# Patient Record
Sex: Female | Born: 1975
Health system: Southern US, Community
[De-identification: ages and names within clinical notes are randomized; demographics above are authoritative.]

## PROBLEM LIST (undated history)

## (undated) DIAGNOSIS — I10 Essential (primary) hypertension: Secondary | ICD-10-CM

## (undated) DIAGNOSIS — D649 Anemia, unspecified: Secondary | ICD-10-CM

## (undated) DIAGNOSIS — I499 Cardiac arrhythmia, unspecified: Secondary | ICD-10-CM

## (undated) DIAGNOSIS — N83209 Unspecified ovarian cyst, unspecified side: Secondary | ICD-10-CM

## (undated) HISTORY — PX: BREAST ENHANCEMENT SURGERY: SHX7

## (undated) HISTORY — PX: APPENDECTOMY: SHX54

---

## 1999-02-28 ENCOUNTER — Other Ambulatory Visit: Admission: RE | Admit: 1999-02-28 | Discharge: 1999-02-28 | Payer: Self-pay | Admitting: Obstetrics and Gynecology

## 1999-03-05 ENCOUNTER — Inpatient Hospital Stay (HOSPITAL_COMMUNITY): Admission: AD | Admit: 1999-03-05 | Discharge: 1999-03-05 | Payer: Self-pay | Admitting: Obstetrics

## 1999-07-04 ENCOUNTER — Other Ambulatory Visit: Admission: RE | Admit: 1999-07-04 | Discharge: 1999-07-04 | Payer: Self-pay | Admitting: Obstetrics and Gynecology

## 1999-07-11 ENCOUNTER — Inpatient Hospital Stay (HOSPITAL_COMMUNITY): Admission: AD | Admit: 1999-07-11 | Discharge: 1999-07-11 | Payer: Self-pay | Admitting: Obstetrics and Gynecology

## 1999-08-07 ENCOUNTER — Other Ambulatory Visit: Admission: RE | Admit: 1999-08-07 | Discharge: 1999-08-07 | Payer: Self-pay | Admitting: Obstetrics and Gynecology

## 1999-08-07 ENCOUNTER — Encounter (INDEPENDENT_AMBULATORY_CARE_PROVIDER_SITE_OTHER): Payer: Self-pay | Admitting: Specialist

## 1999-09-02 ENCOUNTER — Encounter (INDEPENDENT_AMBULATORY_CARE_PROVIDER_SITE_OTHER): Payer: Self-pay | Admitting: Specialist

## 1999-09-02 ENCOUNTER — Inpatient Hospital Stay (HOSPITAL_COMMUNITY): Admission: AD | Admit: 1999-09-02 | Discharge: 1999-09-04 | Payer: Self-pay | Admitting: *Deleted

## 1999-11-16 ENCOUNTER — Encounter (INDEPENDENT_AMBULATORY_CARE_PROVIDER_SITE_OTHER): Payer: Self-pay

## 1999-11-16 ENCOUNTER — Other Ambulatory Visit: Admission: RE | Admit: 1999-11-16 | Discharge: 1999-11-16 | Payer: Self-pay | Admitting: *Deleted

## 2000-03-06 ENCOUNTER — Emergency Department (HOSPITAL_COMMUNITY): Admission: EM | Admit: 2000-03-06 | Discharge: 2000-03-06 | Payer: Self-pay | Admitting: Emergency Medicine

## 2001-02-14 ENCOUNTER — Other Ambulatory Visit: Admission: RE | Admit: 2001-02-14 | Discharge: 2001-02-14 | Payer: Self-pay | Admitting: Obstetrics and Gynecology

## 2001-03-05 ENCOUNTER — Encounter (INDEPENDENT_AMBULATORY_CARE_PROVIDER_SITE_OTHER): Payer: Self-pay

## 2001-03-05 ENCOUNTER — Other Ambulatory Visit: Admission: RE | Admit: 2001-03-05 | Discharge: 2001-03-05 | Payer: Self-pay | Admitting: Obstetrics and Gynecology

## 2002-05-18 ENCOUNTER — Other Ambulatory Visit: Admission: RE | Admit: 2002-05-18 | Discharge: 2002-05-18 | Payer: Self-pay | Admitting: Obstetrics and Gynecology

## 2002-06-25 ENCOUNTER — Encounter: Payer: Self-pay | Admitting: Emergency Medicine

## 2002-06-25 ENCOUNTER — Emergency Department (HOSPITAL_COMMUNITY): Admission: EM | Admit: 2002-06-25 | Discharge: 2002-06-25 | Payer: Self-pay | Admitting: Emergency Medicine

## 2003-09-04 ENCOUNTER — Emergency Department (HOSPITAL_COMMUNITY): Admission: EM | Admit: 2003-09-04 | Discharge: 2003-09-04 | Payer: Self-pay | Admitting: Emergency Medicine

## 2003-09-09 ENCOUNTER — Emergency Department (HOSPITAL_COMMUNITY): Admission: EM | Admit: 2003-09-09 | Discharge: 2003-09-10 | Payer: Self-pay | Admitting: *Deleted

## 2004-12-17 ENCOUNTER — Emergency Department (HOSPITAL_COMMUNITY): Admission: EM | Admit: 2004-12-17 | Discharge: 2004-12-17 | Payer: Self-pay | Admitting: Emergency Medicine

## 2006-09-02 ENCOUNTER — Emergency Department: Payer: Self-pay | Admitting: Emergency Medicine

## 2009-11-06 ENCOUNTER — Emergency Department (HOSPITAL_COMMUNITY): Admission: EM | Admit: 2009-11-06 | Discharge: 2009-11-06 | Payer: Self-pay | Admitting: Emergency Medicine

## 2009-11-07 ENCOUNTER — Emergency Department (HOSPITAL_COMMUNITY): Admission: EM | Admit: 2009-11-07 | Discharge: 2009-11-07 | Payer: Self-pay | Admitting: Emergency Medicine

## 2011-03-09 NOTE — H&P (Signed)
[facility]  Patient:    Melanie Moreno                        MRN: [id number] Adm. Date:  [id number] Attending:  Pleas Koch Dictator:   Vance Gather Duplantis, C.N.M.                         History and Physical  HISTORY OF PRESENT ILLNESS:       Ms. Melanie Moreno is a 35 year old, divorced, white female, gravida 2, para 1-0-0-1 at 36-5/7 weeks who presents complaining of leaking clear fluid at approximately 2 p.m. today.  She reports contractions every 5 to 6 minutes that are mild to moderate.  Her pregnancy has been followed at [facility] by the certified nurse midwife service and has been essentially  uncomplicated though at risk for preterm uterine contractions with this pregnancy and a history of preterm uterine contractions with the previous pregnancy.  She is also rubella negative.  She reports that she is group B Strep negative.  She denies any nausea, vomiting, headaches, or visual disturbances.  She reports positive fetal movement.  OBSTETRICAL HISTORY:              She delivered a viable female infant who weighed 6 pounds 2 ounces at [redacted] weeks gestation following a six-hour labor in September of 1996 with no complications.  Her other GYN history is noncontributory.  ALLERGIES:                        No known drug allergies.  PAST MEDICAL HISTORY:             She reports having had the usual childhood diseases.  She reports occasional urinary tract infections and that she smokes approximately five to eight cigarettes a day.  FAMILY HISTORY:                   Significant for patients mother and maternal grandmother with hypertension.  Otherwise noncontributory.  Genetic history is negative.  SOCIAL HISTORY:                   She is divorced, but the father of the baby is Maley Venezia, and he is involved and supportive.  They are both employed full time. They deny illicit drug use or alcohol with this pregnancy, but she  does report smoking approximately five to eight cigarettes a day.  PRENATAL LABORATORY DATA:         Her blood type is O positive.  Her antibody screen is negative.  Syphilis is nonreactive.  Rubella is negative.  Hepatitis  surface antigen is negative.  Pap is CIN-1, and she requires a repeat Pap postpartum.  Gonorrhea and chlamydia are negative.  HIV is nonreactive. One-hour glucola was elevated, three-hour GGT was within normal limits.  PHYSICAL EXAMINATION:  VITAL SIGNS:                      Stable.  She is afebrile.  HEENT:                            Grossly within normal limits.  HEART:                            Regular rhythm  and rate.  CHEST:                            Clear.  BREASTS:                          Soft and nontender.  ABDOMEN:                          Gravid with uterine contractions every five the six minutes.  Fetal heart rate is reassuring.  PELVIC:                           Copious amounts of clear amniotic fluid leaking. Her cervix is 2 to 3 cm, 90% vertex, -1 station.  EXTREMITIES:                      Within normal limits.  ASSESSMENT:                       1. Intrauterine pregnancy at term.                                   2. Spontaneous rupture of the membranes with                                      clear fluid.                                   3. Negative group B Streptococcus per patient.  PLAN:                             Admit to labor and delivery.  Follow routine                                   certified nurse-midwife orders and to notify                                   Dr. Trudie Reed of patients admission. DD:  09/02/99 TD:  09/02/99 Job: [id number]

## 2011-03-09 NOTE — Op Note (Signed)
[facility]  Patient:    Melanie Moreno                        MRN: [id number] Proc. Date: 09/02/99 Adm. Date:  [id number] Attending:  Pleas Koch                           Operative Report  PREOPERATIVE DIAGNOSIS:       Term pregnancy in multiparous patient with fetal arrhythmia.  POSTOPERATIVE DIAGNOSIS:      Term pregnancy in multiparous patient with fetal arrhythmia.  OPERATION:  SURGEON:                      Georgina Peer, M.D.  ASSISTANT:  ANESTHESIA:  ESTIMATED BLOOD LOSS:  INDICATIONS:                  The patient is a 35 year old, gravida 2, para 1-0-0-1, at 36-5/7 weeks who presented with spontaneous rupture of membranes. he patient developed a fetal arrhythmias with lapses of monitored fetal heart rate  from anywhere from 3 to 15 seconds.  Ultrasound was performed at the bedside which showed dysfunctional contractile cardiac activity during these times.  The patient progressed rapidly to complete and pushed well to +2 station.  There was some fetal bradycardia in the 70s and 80s.  A vacuum extraction for delivery was suggested  with the vertex at +2 to +3.  The risks of vacuum was discussed with the patient. It thought wise to proceed to expedite delivery.  DESCRIPTION OF PROCEDURE:     Kiwi vacuum was applied and delivery was accomplished at 1933 over an intact perineum.  There was mild periurethral abrasions which were not bleeding.  A viable female infant was delivered.  The team was present. Apgars were 8 and 8.  There was an arrhythmia noted by the neonatologist and EKG will e performed.  The placenta was delivered at 1940 intact and sent to pathology. There was minimal bleeding.  Mother was stable.  The baby will be followed closely in the nursery. DD:  09/02/99 TD:  09/04/99 Job: [id number]

## 2013-10-28 ENCOUNTER — Encounter (HOSPITAL_COMMUNITY): Payer: Self-pay | Admitting: Emergency Medicine

## 2013-10-28 ENCOUNTER — Emergency Department (HOSPITAL_COMMUNITY): Payer: BC Managed Care – PPO

## 2013-10-28 ENCOUNTER — Emergency Department (HOSPITAL_COMMUNITY)
Admission: EM | Admit: 2013-10-28 | Discharge: 2013-10-28 | Disposition: A | Discharge status: Home / Self Care | Payer: BC Managed Care – PPO | Attending: Emergency Medicine | Admitting: Emergency Medicine

## 2013-10-28 DIAGNOSIS — Z3202 Encounter for pregnancy test, result negative: Secondary | ICD-10-CM | POA: Insufficient documentation

## 2013-10-28 DIAGNOSIS — F17200 Nicotine dependence, unspecified, uncomplicated: Secondary | ICD-10-CM | POA: Insufficient documentation

## 2013-10-28 DIAGNOSIS — R109 Unspecified abdominal pain: Secondary | ICD-10-CM | POA: Insufficient documentation

## 2013-10-28 DIAGNOSIS — Z8679 Personal history of other diseases of the circulatory system: Secondary | ICD-10-CM | POA: Insufficient documentation

## 2013-10-28 DIAGNOSIS — R110 Nausea: Secondary | ICD-10-CM | POA: Insufficient documentation

## 2013-10-28 DIAGNOSIS — Z862 Personal history of diseases of the blood and blood-forming organs and certain disorders involving the immune mechanism: Secondary | ICD-10-CM | POA: Insufficient documentation

## 2013-10-28 HISTORY — DX: Anemia, unspecified: D64.9

## 2013-10-28 HISTORY — DX: Cardiac arrhythmia, unspecified: I49.9

## 2013-10-28 LAB — CBC WITH DIFFERENTIAL/PLATELET
Basophils Absolute: 0 10*3/uL (ref 0.0–0.1)
Basophils Relative: 0 % (ref 0–1)
Eosinophils Absolute: 0.1 10*3/uL (ref 0.0–0.7)
Eosinophils Relative: 1 % (ref 0–5)
HCT: 37.7 % (ref 36.0–46.0)
Hemoglobin: 13.1 g/dL (ref 12.0–15.0)
Lymphocytes Relative: 35 % (ref 12–46)
Lymphs Abs: 3 10*3/uL (ref 0.7–4.0)
MCH: 32.8 pg (ref 26.0–34.0)
MCHC: 34.7 g/dL (ref 30.0–36.0)
MCV: 94.3 fL (ref 78.0–100.0)
Monocytes Absolute: 0.7 10*3/uL (ref 0.1–1.0)
Monocytes Relative: 8 % (ref 3–12)
Neutro Abs: 4.9 10*3/uL (ref 1.7–7.7)
Neutrophils Relative %: 56 % (ref 43–77)
Platelets: 226 10*3/uL (ref 150–400)
RBC: 4 MIL/uL (ref 3.87–5.11)
RDW: 13.5 % (ref 11.5–15.5)
WBC: 8.7 10*3/uL (ref 4.0–10.5)

## 2013-10-28 LAB — COMPREHENSIVE METABOLIC PANEL
ALT: 19 U/L (ref 0–35)
AST: 22 U/L (ref 0–37)
Albumin: 3.9 g/dL (ref 3.5–5.2)
Alkaline Phosphatase: 59 U/L (ref 39–117)
BUN: 17 mg/dL (ref 6–23)
CO2: 23 mEq/L (ref 19–32)
Calcium: 9.4 mg/dL (ref 8.4–10.5)
Chloride: 101 mEq/L (ref 96–112)
Creatinine, Ser: 0.8 mg/dL (ref 0.50–1.10)
GFR calc Af Amer: >90 mL/min (ref >90)
GFR calc non Af Amer: >90 mL/min (ref >90)
Glucose, Bld: 108 mg/dL — ABNORMAL HIGH (ref 70–99)
Potassium: 3.9 mEq/L (ref 3.7–5.3)
Sodium: 138 mEq/L (ref 137–147)
Total Bilirubin: <0.2 mg/dL — ABNORMAL LOW (ref 0.3–1.2)
Total Protein: 7.1 g/dL (ref 6.0–8.3)

## 2013-10-28 LAB — URINALYSIS, ROUTINE W REFLEX MICROSCOPIC
Bilirubin Urine: NEGATIVE
Glucose, UA: NEGATIVE mg/dL
Hgb urine dipstick: NEGATIVE
Ketones, ur: NEGATIVE mg/dL
Leukocytes, UA: NEGATIVE
Nitrite: NEGATIVE
Protein, ur: NEGATIVE mg/dL
Specific Gravity, Urine: 1.019 (ref 1.005–1.030)
Urobilinogen, UA: 1 mg/dL (ref 0.0–1.0)
pH: 6.5 (ref 5.0–8.0)

## 2013-10-28 LAB — LIPASE, BLOOD: Lipase: 19 U/L (ref 11–59)

## 2013-10-28 LAB — POCT PREGNANCY, URINE: Preg Test, Ur: NEGATIVE

## 2013-10-28 MED ORDER — CYCLOBENZAPRINE HCL 5 MG PO TABS: 5.0000 mg | ORAL_TABLET | Freq: Three times a day (TID) | ORAL | Status: DC | PRN

## 2013-10-28 MED ORDER — TRAMADOL HCL 50 MG PO TABS: 25.0000 mg | ORAL_TABLET | Freq: Four times a day (QID) | ORAL | Status: DC | PRN

## 2013-10-28 MED ORDER — MORPHINE SULFATE 4 MG/ML IJ SOLN
4.0000 mg | Freq: Once | INTRAMUSCULAR | Status: AC
  Administered 2013-10-28: 4 mg via INTRAVENOUS

## 2013-10-28 MED ORDER — GI COCKTAIL ~~LOC~~
30.0000 mL | Freq: Once | ORAL | Status: AC
  Administered 2013-10-28: 30 mL via ORAL
  Filled 2013-10-28: qty 30

## 2013-10-28 MED ORDER — MORPHINE SULFATE 4 MG/ML IJ SOLN
4.0000 mg | Freq: Once | INTRAMUSCULAR | Status: AC
  Administered 2013-10-28: 4 mg via INTRAVENOUS
  Filled 2013-10-28: qty 1

## 2013-10-28 MED ORDER — ONDANSETRON HCL 4 MG/2ML IJ SOLN
4.0000 mg | Freq: Once | INTRAMUSCULAR | Status: AC
  Administered 2013-10-28: 4 mg via INTRAVENOUS

## 2013-10-28 MED ORDER — IOHEXOL 300 MG/ML  SOLN
50.0000 mL | Freq: Once | INTRAMUSCULAR | Status: AC | PRN
  Administered 2013-10-28: 50 mL via ORAL

## 2013-10-28 MED ORDER — IOHEXOL 300 MG/ML  SOLN
100.0000 mL | Freq: Once | INTRAMUSCULAR | Status: AC | PRN
  Administered 2013-10-28: 100 mL via INTRAVENOUS

## 2013-10-28 MED ORDER — MORPHINE SULFATE 4 MG/ML IJ SOLN: 4.0000 mg | Freq: Once | INTRAMUSCULAR | Status: DC

## 2013-10-28 NOTE — ED Notes (Signed)
Pt from home c/o  Flank pain for 2 months on the right side. Pt was seen for same previously with no relief from meds.  Pt reports no urinary symptoms and that it gets worse she eats.

## 2013-10-28 NOTE — ED Provider Notes (Signed)
Medical screening examination/treatment/procedure(s) were performed by non-physician practitioner and as supervising physician I was immediately available for consultation/collaboration.  EKG Interpretation   None        Alonni Heimsoth F Marshawn Normoyle, MD 10/28/13 0734 

## 2013-10-28 NOTE — ED Provider Notes (Signed)
CSN: [id number]     Arrival date & time 10/28/13  0112 History   First MD Initiated Contact with Patient 10/28/13 0142     Chief Complaint  Patient presents with  . Flank Pain   (Consider location/radiation/quality/duration/timing/severity/associated sxs/prior Treatment) Patient is a 38 y.o. female presenting with flank pain. The history is provided by the patient.  Flank Pain This is a chronic problem. The current episode started more than 1 month ago. The problem occurs constantly. The problem has been gradually worsening. Associated symptoms include abdominal pain and nausea. Pertinent negatives include no anorexia, coughing, fever, rash, urinary symptoms or vomiting. The symptoms are aggravated by eating. She has tried NSAIDs for the symptoms. The treatment provided no relief.    Past Medical History  Diagnosis Date  . Anemia   . Irregular heart rate    Past Surgical History  Procedure Laterality Date  . Appendectomy    . Breast enhancement surgery     History reviewed. No pertinent family history. History  Substance Use Topics  . Smoking status: Current Every Day Smoker -- 1.00 packs/day    Types: Cigarettes  . Smokeless tobacco: Not on file  . Alcohol Use: Yes     Comment: occassional   OB History   Grav Para Term Preterm Abortions TAB SAB Ect Mult Living                 Review of Systems  Constitutional: Negative for fever.  Respiratory: Negative for cough.   Gastrointestinal: Positive for nausea and abdominal pain. Negative for vomiting, diarrhea, constipation and anorexia.  Genitourinary: Positive for flank pain. Negative for dysuria, decreased urine volume, vaginal bleeding, vaginal discharge, difficulty urinating, vaginal pain, menstrual problem and pelvic pain.  Skin: Negative for pallor and rash.  All other systems reviewed and are negative.    Allergies  Review of patient's allergies indicates no known allergies.  Home Medications   Current Outpatient  Rx  Name  Route  Sig  Dispense  Refill  . ibuprofen (ADVIL,MOTRIN) 200 MG tablet   Oral   Take 800 mg by mouth every 6 (six) hours as needed. For pain         . cyclobenzaprine (FLEXERIL) 5 MG tablet   Oral   Take 1 tablet (5 mg total) by mouth 3 (three) times daily as needed for muscle spasms.   30 tablet   0   . traMADol (ULTRAM) 50 MG tablet   Oral   Take 0.5 tablets (25 mg total) by mouth every 6 (six) hours as needed. For pain   30 tablet   0    BP 133/86  Pulse 100  Temp(Src) 98.7 F (37.1 C) (Oral)  Resp 18  Ht 5\' 3"  (1.6 m)  Wt 125 lb 4 oz (56.813 kg)  BMI 22.19 kg/m2  SpO2 99%  LMP 10/16/2013 Physical Exam  Nursing note and vitals reviewed. Constitutional: She appears well-developed and well-nourished.  HENT:  Head: Normocephalic.  Eyes: Pupils are equal, round, and reactive to light.  Cardiovascular: Normal rate and regular rhythm.   Pulmonary/Chest: Effort normal and breath sounds normal.  Abdominal: Soft. Bowel sounds are normal. She exhibits no distension. There is no hepatosplenomegaly. There is tenderness. There is no rebound and no guarding.    Musculoskeletal: Normal range of motion.  Skin: Skin is warm.    ED Course  Procedures (including critical care time) Labs Review Labs Reviewed  COMPREHENSIVE METABOLIC PANEL - Abnormal; Notable for the following:  Glucose, Bld 108 (*)    Total Bilirubin <0.2 (*)    All other components within normal limits  URINALYSIS, ROUTINE W REFLEX MICROSCOPIC - Abnormal; Notable for the following:    APPearance CLOUDY (*)    All other components within normal limits  CBC WITH DIFFERENTIAL  LIPASE, BLOOD  POCT PREGNANCY, URINE   Imaging Review US Abdomen Complete  10/28/2013   CLINICAL DATA:  Flank pain  EXAM: ULTRASOUND ABDOMEN COMPLETE  COMPARISON:  None.  FINDINGS: Gallbladder:  No gallstones or wall thickening visualized. No sonographic Murphy sign noted.  Common bile duct:  Diameter: 3 mm  Liver:  No  focal lesion suspected. There is occasional shadowing of the posterior segment right lobe, which is likely artifactual from the portal triad. No definite mass in this region when all images considered. Antegrade flow in the imaged portal venous system.  IVC:  No abnormality visualized.  Pancreas:  Visualized portion unremarkable.  Spleen:  Size and appearance within normal limits.  Right Kidney:  Length: 10 cm. Echogenicity within normal limits. No mass or hydronephrosis visualized.  Left Kidney:  Length: 11 cm. Echogenicity within normal limits. No mass or hydronephrosis visualized.  Abdominal aorta:  No aneurysm visualized.  Other findings:  None.  IMPRESSION: Negative abdominal sonogram.   Electronically Signed   By: Tiburcio Pea M.D.   On: 10/28/2013 02:52   Ct Abdomen Pelvis W Contrast  10/28/2013   CLINICAL DATA:  Right upper quadrant pain for months  EXAM: CT ABDOMEN AND PELVIS WITH CONTRAST  TECHNIQUE: Multidetector CT imaging of the abdomen and pelvis was performed using the standard protocol following bolus administration of intravenous contrast.  CONTRAST:  OMNIPAQUE IOHEXOL 300 MG/ML  SOLN  COMPARISON:  09/10/2003  FINDINGS: BODY WALL: Partly imaged bilateral breast implants. Visible portions unremarkable.  LOWER CHEST: Unremarkable.  ABDOMEN/PELVIS:  Liver: There may be mild fatty infiltration, with certainty decreased by early contrast timing.  Biliary: No evidence of biliary obstruction or stone.  Pancreas: Unremarkable.  Spleen: Unremarkable.  Adrenals: Unremarkable.  Kidneys and ureters: No hydronephrosis or stone.  Bladder: Unremarkable.  Reproductive: Right-sided corpus luteum.  Bowel: Circumferentially thick appearance of the gastric antral wall, 9 mm single wal this is in the indeterminate range, especially given collapsed state, but could be pathologic given the history of ongoing right upper quadrant pain. No bowel obstruction. Appendectomy.  Retroperitoneum: No mass or adenopathy.   Peritoneum: No free fluid or gas.  Vascular: No acute abnormality.  OSSEOUS: No acute abnormalities.  IMPRESSION: 1. No acute intra-abdominal finding. 2. Possible gastric antritis.   Electronically Signed   By: Tiburcio Pea M.D.   On: 10/28/2013 05:59    EKG Interpretation   None       MDM   1. Flank pain    No specific cause for this patient's pain has been identified.  Ultrasound, labs, and CT scan, all within normal limits.  Urine is also normal.  She has been given a prescription for tramadol as well as Flexeril to try regular basis for the next week.  If she continues to have pain.  Pain worsens, or changes in nature, or intensity.  She is to return to her primary care physician or the emergency department for further evaluation     Arman Filter, NP 10/28/13 1610

## 2013-10-28 NOTE — Discharge Instructions (Signed)
No specific cause for your flank pain.  Has been identified, tonight, your CT scan, and ultrasound, both were normal as were your labs and urine.  You have been given medication for pain control, as well as a muscle relaxant.  Please try this on a regular basis for the next 5-7 days.  Hopefully, this will help if you continue to have pain or your pain worsens, please followup with your primary care physician or return to the emergency room for further evaluation

## 2013-10-28 NOTE — ED Notes (Signed)
Patient is alert and oriented x3.  She was given DC instructions and follow up visit instructions.  Patient gave verbal understanding. She was DC ambulatory under her own power to home.  V/S stable.  He was not showing any signs of distress on DC 

## 2013-12-15 ENCOUNTER — Encounter (HOSPITAL_COMMUNITY): Payer: Self-pay | Admitting: Emergency Medicine

## 2013-12-15 ENCOUNTER — Emergency Department (HOSPITAL_COMMUNITY)
Admission: EM | Admit: 2013-12-15 | Discharge: 2013-12-16 | Disposition: A | Discharge status: Home / Self Care | Payer: BC Managed Care – PPO | Attending: Emergency Medicine | Admitting: Emergency Medicine

## 2013-12-15 DIAGNOSIS — F17200 Nicotine dependence, unspecified, uncomplicated: Secondary | ICD-10-CM | POA: Insufficient documentation

## 2013-12-15 DIAGNOSIS — Z9089 Acquired absence of other organs: Secondary | ICD-10-CM | POA: Insufficient documentation

## 2013-12-15 DIAGNOSIS — R1030 Lower abdominal pain, unspecified: Secondary | ICD-10-CM

## 2013-12-15 DIAGNOSIS — K219 Gastro-esophageal reflux disease without esophagitis: Secondary | ICD-10-CM

## 2013-12-15 DIAGNOSIS — Z3202 Encounter for pregnancy test, result negative: Secondary | ICD-10-CM | POA: Insufficient documentation

## 2013-12-15 DIAGNOSIS — Z8679 Personal history of other diseases of the circulatory system: Secondary | ICD-10-CM | POA: Insufficient documentation

## 2013-12-15 DIAGNOSIS — N889 Noninflammatory disorder of cervix uteri, unspecified: Secondary | ICD-10-CM | POA: Insufficient documentation

## 2013-12-15 DIAGNOSIS — Z862 Personal history of diseases of the blood and blood-forming organs and certain disorders involving the immune mechanism: Secondary | ICD-10-CM | POA: Insufficient documentation

## 2013-12-15 HISTORY — DX: Unspecified ovarian cyst, unspecified side: N83.209

## 2013-12-15 MED ORDER — HYDROMORPHONE HCL PF 1 MG/ML IJ SOLN
1.0000 mg | Freq: Once | INTRAMUSCULAR | Status: AC
  Administered 2013-12-16: 1 mg via INTRAVENOUS
  Filled 2013-12-15: qty 1

## 2013-12-15 MED ORDER — SODIUM CHLORIDE 0.9 % IV BOLUS (SEPSIS)
1000.0000 mL | Freq: Once | INTRAVENOUS | Status: AC
  Administered 2013-12-16: 1000 mL via INTRAVENOUS

## 2013-12-15 NOTE — ED Notes (Signed)
Pt reports that she began having a week ago, states that she has been having nausea and black tarry stools since that time. Pt reports hx of ovarian cyst rupture, reports pain worsens after eating. LBM today. Pt a&o x4, ambulatory to triage.

## 2013-12-15 NOTE — ED Provider Notes (Signed)
CSN: [id number]     Arrival date & time 12/15/13  2259 History   First MD Initiated Contact with Patient 12/15/13 2323     Chief Complaint  Patient presents with  . Abdominal Pain     (Consider location/radiation/quality/duration/timing/severity/associated sxs/prior Treatment) The history is provided by the patient. No language interpreter was used.  Melanie Moreno is a 38 y/o female with past mental history of anemia, irregular heartbeat, ovarian cyst presenting to emergency department with abdominal pain that started last Tuesday. Patient reported that the abdominal pain is localized to bilateral lower quadrant, pelvic discomfort described as "labor pains." Reported that the pain is constant, but reported that the pain worsens after eating. Reported that she was experiencing melena on Thursday-reported that with every single bowel movement she has been having black tarry stools. Reported that she's been having nausea. Patient reported that she has not taken anything for the discomfort. Patient reported last menstrual period approximately one week ago-reported regular cycles. Denied vomiting, hematochezia, chest pain, shortness of breath, difficulty breathing, fever, chills, urinary issues, vaginal discharge, vaginal bleeding. PCP none Patient has not seen an OBGYN physician in approximately 14 years.   Past Medical History  Diagnosis Date  . Anemia   . Irregular heart rate   . Ovarian cyst    Past Surgical History  Procedure Laterality Date  . Appendectomy    . Breast enhancement surgery     History reviewed. No pertinent family history. History  Substance Use Topics  . Smoking status: Current Every Day Smoker -- 1.00 packs/day    Types: Cigarettes  . Smokeless tobacco: Never Used  . Alcohol Use: Yes     Comment: occassional   OB History   Grav Para Term Preterm Abortions TAB SAB Ect Mult Living                 Review of Systems  Constitutional: Negative for fever and chills.   Respiratory: Negative for chest tightness and shortness of breath.   Cardiovascular: Negative for chest pain.  Gastrointestinal: Positive for nausea, abdominal pain and blood in stool. Negative for vomiting, diarrhea, constipation and anal bleeding.  Genitourinary: Positive for pelvic pain. Negative for vaginal bleeding, vaginal discharge and vaginal pain.  Musculoskeletal: Negative for back pain and neck pain.  Neurological: Negative for dizziness and weakness.  All other systems reviewed and are negative.    Allergies  Dilaudid  Home Medications   No current outpatient prescriptions on file. BP 118/76  Pulse 91  Temp(Src) 97.7 F (36.5 C) (Oral)  Resp 18  Ht 5\' 4"  (1.626 m)  Wt 126 lb (57.153 kg)  BMI 21.62 kg/m2  SpO2 97%  LMP 12/04/2013 Physical Exam  Nursing note and vitals reviewed. Constitutional: She is oriented to person, place, and time. She appears well-developed and well-nourished. No distress.  HENT:  Head: Normocephalic and atraumatic.  Mouth/Throat: Oropharynx is clear and moist. No oropharyngeal exudate.  Eyes: Conjunctivae and EOM are normal. Pupils are equal, round, and reactive to light. Right eye exhibits no discharge. Left eye exhibits no discharge.  Neck: Normal range of motion. Neck supple. No tracheal deviation present.  Cardiovascular: Normal rate, regular rhythm and normal heart sounds.  Exam reveals no friction rub.   No murmur heard. Pulses:      Radial pulses are 2+ on the right side, and 2+ on the left side.       Dorsalis pedis pulses are 2+ on the right side, and 2+ on the left  side.  Pulmonary/Chest: Effort normal and breath sounds normal. No respiratory distress. She has no wheezes. She has no rales.  Abdominal: Soft. Bowel sounds are normal. There is tenderness. There is no guarding.  Negative abdominal distension noted Bowel sounds normal active in all 4 quadrants Soft upon palpation Discomfort upon palpation to right lower quadrant  and left lower quadrant upon palpation Negative Murphy's sign Positive McBurney's point  Genitourinary:  Pelvic Exam: Negative swelling, erythema, inflammation, lesions, sores, abnormalities noted external genitalia. Negative swelling, erythema, inflammation, lesions, sores, deformities, masses noted to the vaginal canal. Negative blood in the vaginal vault. Negative discharge noted. Cervix identified with negative friability. Negative strawberry appearance. Negative active drainage noted from the cervical os. Negative CMT. Negative bilateral adnexal tenderness. Exam chaperoned  Rectal exam: Skin tag noted at approximately 5 o'clock. Negative swelling, erythema, inflammation, lesions, sores, hemorrhoids noted to the anus. Sphincter tone strong. Negative masses, internal hemorrhoids, swelling, fissures, lesions noted to the rectum. Brown stool on glove. Negative blood on glove.  Musculoskeletal: Normal range of motion.  Full ROM to upper and lower extremities without difficulty noted, negative ataxia noted.  Lymphadenopathy:    She has no cervical adenopathy.  Neurological: She is alert and oriented to person, place, and time. She exhibits normal muscle tone. Coordination normal.  Cranial nerves III-XII grossly intact Strength 5+/5+ to upper and lower extremities bilaterally with resistance applied, equal distribution noted  Skin: Skin is warm and dry. No rash noted. She is not diaphoretic. No erythema.  Psychiatric: She has a normal mood and affect. Her behavior is normal. Thought content normal.    ED Course  Procedures (including critical care time)  Results for orders placed during the hospital encounter of 12/15/13  WET PREP, GENITAL      Result Value Ref Range   Yeast Wet Prep HPF POC NONE SEEN  NONE SEEN   Trich, Wet Prep NONE SEEN  NONE SEEN   Clue Cells Wet Prep HPF POC NONE SEEN  NONE SEEN   WBC, Wet Prep HPF POC NONE SEEN  NONE SEEN  CBC WITH DIFFERENTIAL      Result Value Ref  Range   WBC 8.9  4.0 - 10.5 K/uL   RBC 4.03  3.87 - 5.11 MIL/uL   Hemoglobin 12.9  12.0 - 15.0 g/dL   HCT 96.038.3  45.436.0 - 09.846.0 %   MCV 95.0  78.0 - 100.0 fL   MCH 32.0  26.0 - 34.0 pg   MCHC 33.7  30.0 - 36.0 g/dL   RDW 11.913.1  14.711.5 - 82.915.5 %   Platelets 262  150 - 400 K/uL   Neutrophils Relative % 58  43 - 77 %   Neutro Abs 5.2  1.7 - 7.7 K/uL   Lymphocytes Relative 34  12 - 46 %   Lymphs Abs 3.1  0.7 - 4.0 K/uL   Monocytes Relative 6  3 - 12 %   Monocytes Absolute 0.6  0.1 - 1.0 K/uL   Eosinophils Relative 1  0 - 5 %   Eosinophils Absolute 0.1  0.0 - 0.7 K/uL   Basophils Relative 0  0 - 1 %   Basophils Absolute 0.0  0.0 - 0.1 K/uL  COMPREHENSIVE METABOLIC PANEL      Result Value Ref Range   Sodium 138  137 - 147 mEq/L   Potassium 4.1  3.7 - 5.3 mEq/L   Chloride 101  96 - 112 mEq/L   CO2 21  19 -  32 mEq/L   Glucose, Bld 101 (*) 70 - 99 mg/dL   BUN 21  6 - 23 mg/dL   Creatinine, Ser 1.61  0.50 - 1.10 mg/dL   Calcium 9.3  8.4 - 09.6 mg/dL   Total Protein 7.2  6.0 - 8.3 g/dL   Albumin 3.8  3.5 - 5.2 g/dL   AST 19  0 - 37 U/L   ALT 16  0 - 35 U/L   Alkaline Phosphatase 56  39 - 117 U/L   Total Bilirubin <0.2 (*) 0.3 - 1.2 mg/dL   GFR calc non Af Amer >90  >90 mL/min   GFR calc Af Amer >90  >90 mL/min  URINALYSIS, ROUTINE W REFLEX MICROSCOPIC      Result Value Ref Range   Color, Urine YELLOW  YELLOW   APPearance CLEAR  CLEAR   Specific Gravity, Urine 1.024  1.005 - 1.030   pH 6.0  5.0 - 8.0   Glucose, UA NEGATIVE  NEGATIVE mg/dL   Hgb urine dipstick NEGATIVE  NEGATIVE   Bilirubin Urine NEGATIVE  NEGATIVE   Ketones, ur NEGATIVE  NEGATIVE mg/dL   Protein, ur NEGATIVE  NEGATIVE mg/dL   Urobilinogen, UA 1.0  0.0 - 1.0 mg/dL   Nitrite NEGATIVE  NEGATIVE   Leukocytes, UA NEGATIVE  NEGATIVE  PREGNANCY, URINE      Result Value Ref Range   Preg Test, Ur NEGATIVE  NEGATIVE  LIPASE, BLOOD      Result Value Ref Range   Lipase 18  11 - 59 U/L    US Transvaginal  Non-ob  12/16/2013   CLINICAL DATA:  38 year old female with pelvic pain.  EXAM: TRANSABDOMINAL AND TRANSVAGINAL ULTRASOUND OF PELVIS  DOPPLER ULTRASOUND OF OVARIES  TECHNIQUE: Both transabdominal and transvaginal ultrasound examinations of the pelvis were performed. Transabdominal technique was performed for global imaging of the pelvis including uterus, ovaries, adnexal regions, and pelvic cul-de-sac.  It was necessary to proceed with endovaginal exam following the transabdominal exam to visualize the ovaries and endometrium. Color and duplex Doppler ultrasound was utilized to evaluate blood flow to the ovaries.  COMPARISON:  10/28/2013 CT  FINDINGS: Uterus  Measurements: Anteverted measuring 8.6 x 3.6 x 4.4 cm. Uterine echotexture is slightly heterogeneous. No fibroids or other mass visualized.  Endometrium  Thickness: 9 mm.  No focal abnormality visualized.  Right ovary  Measurements: 2.2 x 2.5 x 2.6 cm. Normal appearance/no adnexal mass.  Left ovary  Measurements: 3.1 x 2.2 x 3.2 cm. Normal appearance/no adnexal mass.  Pulsed Doppler evaluation of both ovaries demonstrates normal low-resistance arterial and venous waveforms.  Other findings  No free fluid or adnexal mass identified.  IMPRESSION: Unremarkable pelvic ultrasound. No evidence of ovarian torsion or mass.   Electronically Signed   By: Laveda Abbe M.D.   On: 12/16/2013 01:31   US Pelvis Complete  12/16/2013   CLINICAL DATA:  38 year old female with pelvic pain.  EXAM: TRANSABDOMINAL AND TRANSVAGINAL ULTRASOUND OF PELVIS  DOPPLER ULTRASOUND OF OVARIES  TECHNIQUE: Both transabdominal and transvaginal ultrasound examinations of the pelvis were performed. Transabdominal technique was performed for global imaging of the pelvis including uterus, ovaries, adnexal regions, and pelvic cul-de-sac.  It was necessary to proceed with endovaginal exam following the transabdominal exam to visualize the ovaries and endometrium. Color and duplex Doppler ultrasound  was utilized to evaluate blood flow to the ovaries.  COMPARISON:  10/28/2013 CT  FINDINGS: Uterus  Measurements: Anteverted measuring 8.6 x 3.6 x 4.4 cm. Uterine  echotexture is slightly heterogeneous. No fibroids or other mass visualized.  Endometrium  Thickness: 9 mm.  No focal abnormality visualized.  Right ovary  Measurements: 2.2 x 2.5 x 2.6 cm. Normal appearance/no adnexal mass.  Left ovary  Measurements: 3.1 x 2.2 x 3.2 cm. Normal appearance/no adnexal mass.  Pulsed Doppler evaluation of both ovaries demonstrates normal low-resistance arterial and venous waveforms.  Other findings  No free fluid or adnexal mass identified.  IMPRESSION: Unremarkable pelvic ultrasound. No evidence of ovarian torsion or mass.   Electronically Signed   By: Laveda Abbe M.D.   On: 12/16/2013 01:31   [place] Art/ven Flow Abd Pelv Doppler  12/16/2013   CLINICAL DATA:  38 year old female with pelvic pain.  EXAM: TRANSABDOMINAL AND TRANSVAGINAL ULTRASOUND OF PELVIS  DOPPLER ULTRASOUND OF OVARIES  TECHNIQUE: Both transabdominal and transvaginal ultrasound examinations of the pelvis were performed. Transabdominal technique was performed for global imaging of the pelvis including uterus, ovaries, adnexal regions, and pelvic cul-de-sac.  It was necessary to proceed with endovaginal exam following the transabdominal exam to visualize the ovaries and endometrium. Color and duplex Doppler ultrasound was utilized to evaluate blood flow to the ovaries.  COMPARISON:  10/28/2013 CT  FINDINGS: Uterus  Measurements: Anteverted measuring 8.6 x 3.6 x 4.4 cm. Uterine echotexture is slightly heterogeneous. No fibroids or other mass visualized.  Endometrium  Thickness: 9 mm.  No focal abnormality visualized.  Right ovary  Measurements: 2.2 x 2.5 x 2.6 cm. Normal appearance/no adnexal mass.  Left ovary  Measurements: 3.1 x 2.2 x 3.2 cm. Normal appearance/no adnexal mass.  Pulsed Doppler evaluation of both ovaries demonstrates normal low-resistance arterial and  venous waveforms.  Other findings  No free fluid or adnexal mass identified.  IMPRESSION: Unremarkable pelvic ultrasound. No evidence of ovarian torsion or mass.   Electronically Signed   By: Laveda Abbe M.D.   On: 12/16/2013 01:31   Labs Review Labs Reviewed  COMPREHENSIVE METABOLIC PANEL - Abnormal; Notable for the following:    Glucose, Bld 101 (*)    Total Bilirubin <0.2 (*)    All other components within normal limits  WET PREP, GENITAL  GC/CHLAMYDIA PROBE AMP  CBC WITH DIFFERENTIAL  URINALYSIS, ROUTINE W REFLEX MICROSCOPIC  PREGNANCY, URINE  LIPASE, BLOOD   Imaging Review US Transvaginal Non-ob  12/16/2013   CLINICAL DATA:  38 year old female with pelvic pain.  EXAM: TRANSABDOMINAL AND TRANSVAGINAL ULTRASOUND OF PELVIS  DOPPLER ULTRASOUND OF OVARIES  TECHNIQUE: Both transabdominal and transvaginal ultrasound examinations of the pelvis were performed. Transabdominal technique was performed for global imaging of the pelvis including uterus, ovaries, adnexal regions, and pelvic cul-de-sac.  It was necessary to proceed with endovaginal exam following the transabdominal exam to visualize the ovaries and endometrium. Color and duplex Doppler ultrasound was utilized to evaluate blood flow to the ovaries.  COMPARISON:  10/28/2013 CT  FINDINGS: Uterus  Measurements: Anteverted measuring 8.6 x 3.6 x 4.4 cm. Uterine echotexture is slightly heterogeneous. No fibroids or other mass visualized.  Endometrium  Thickness: 9 mm.  No focal abnormality visualized.  Right ovary  Measurements: 2.2 x 2.5 x 2.6 cm. Normal appearance/no adnexal mass.  Left ovary  Measurements: 3.1 x 2.2 x 3.2 cm. Normal appearance/no adnexal mass.  Pulsed Doppler evaluation of both ovaries demonstrates normal low-resistance arterial and venous waveforms.  Other findings  No free fluid or adnexal mass identified.  IMPRESSION: Unremarkable pelvic ultrasound. No evidence of ovarian torsion or mass.   Electronically Signed   By: Trey Paula  Hu  M.D.   On: 12/16/2013 01:31   US Pelvis Complete  12/16/2013   CLINICAL DATA:  38 year old female with pelvic pain.  EXAM: TRANSABDOMINAL AND TRANSVAGINAL ULTRASOUND OF PELVIS  DOPPLER ULTRASOUND OF OVARIES  TECHNIQUE: Both transabdominal and transvaginal ultrasound examinations of the pelvis were performed. Transabdominal technique was performed for global imaging of the pelvis including uterus, ovaries, adnexal regions, and pelvic cul-de-sac.  It was necessary to proceed with endovaginal exam following the transabdominal exam to visualize the ovaries and endometrium. Color and duplex Doppler ultrasound was utilized to evaluate blood flow to the ovaries.  COMPARISON:  10/28/2013 CT  FINDINGS: Uterus  Measurements: Anteverted measuring 8.6 x 3.6 x 4.4 cm. Uterine echotexture is slightly heterogeneous. No fibroids or other mass visualized.  Endometrium  Thickness: 9 mm.  No focal abnormality visualized.  Right ovary  Measurements: 2.2 x 2.5 x 2.6 cm. Normal appearance/no adnexal mass.  Left ovary  Measurements: 3.1 x 2.2 x 3.2 cm. Normal appearance/no adnexal mass.  Pulsed Doppler evaluation of both ovaries demonstrates normal low-resistance arterial and venous waveforms.  Other findings  No free fluid or adnexal mass identified.  IMPRESSION: Unremarkable pelvic ultrasound. No evidence of ovarian torsion or mass.   Electronically Signed   By: Laveda Abbe M.D.   On: 12/16/2013 01:31   [place] Art/ven Flow Abd Pelv Doppler  12/16/2013   CLINICAL DATA:  38 year old female with pelvic pain.  EXAM: TRANSABDOMINAL AND TRANSVAGINAL ULTRASOUND OF PELVIS  DOPPLER ULTRASOUND OF OVARIES  TECHNIQUE: Both transabdominal and transvaginal ultrasound examinations of the pelvis were performed. Transabdominal technique was performed for global imaging of the pelvis including uterus, ovaries, adnexal regions, and pelvic cul-de-sac.  It was necessary to proceed with endovaginal exam following the transabdominal exam to visualize the  ovaries and endometrium. Color and duplex Doppler ultrasound was utilized to evaluate blood flow to the ovaries.  COMPARISON:  10/28/2013 CT  FINDINGS: Uterus  Measurements: Anteverted measuring 8.6 x 3.6 x 4.4 cm. Uterine echotexture is slightly heterogeneous. No fibroids or other mass visualized.  Endometrium  Thickness: 9 mm.  No focal abnormality visualized.  Right ovary  Measurements: 2.2 x 2.5 x 2.6 cm. Normal appearance/no adnexal mass.  Left ovary  Measurements: 3.1 x 2.2 x 3.2 cm. Normal appearance/no adnexal mass.  Pulsed Doppler evaluation of both ovaries demonstrates normal low-resistance arterial and venous waveforms.  Other findings  No free fluid or adnexal mass identified.  IMPRESSION: Unremarkable pelvic ultrasound. No evidence of ovarian torsion or mass.   Electronically Signed   By: Laveda Abbe M.D.   On: 12/16/2013 01:31    EKG Interpretation   None       MDM   Final diagnoses:  None   Medications  sodium chloride 0.9 % bolus 1,000 mL (0 mLs Intravenous Stopped 12/16/13 0052)  HYDROmorphone (DILAUDID) injection 1 mg (1 mg Intravenous Given 12/16/13 0010)  sodium chloride 0.9 % bolus 1,000 mL (0 mLs Intravenous Stopped 12/16/13 0222)  iohexol (OMNIPAQUE) 300 MG/ML solution 50 mL (50 mLs Oral Contrast Given 12/16/13 0237)  morphine 2 MG/ML injection 2 mg (2 mg Intravenous Given 12/16/13 0258)  ondansetron (ZOFRAN) injection 4 mg (4 mg Intravenous Given 12/16/13 0258)  sodium chloride 0.9 % bolus 1,000 mL (1,000 mLs Intravenous New Bag/Given 12/16/13 0258)  iohexol (OMNIPAQUE) 300 MG/ML solution 100 mL (100 mLs Intravenous Contrast Given 12/16/13 0309)   Filed Vitals:   12/15/13 2307 12/16/13 0121 12/16/13 0141 12/16/13 0219  BP: 135/87 123/74 118/73 118/76  Pulse: 105 85 85 91  Temp: 98.2 F (36.8 C)   97.7 F (36.5 C)  TempSrc: Oral   Oral  Resp: 20 18 20 18   Height: 5\' 4"  (1.626 m)     Weight: 126 lb (57.153 kg)     SpO2: 97% 99% 98% 97%    Patient presenting to the ED  with abdominal pain localize to bilateral lower quadrants that started last Tuesday described as "labor pains" that are constant, reported worsening pain more so after eating. Patient reports that the pain predominantly stays within the lower portion of her abdomen without radiation. Reported feeling nauseous with negative episodes of emesis. Reported that starting Thursday patient has been having continuous episodes of melena with each bowel movement. Reported history of ovarian cysts. This provider reviewed and assessed the patient's chart. Patient was seen and assessed on 10/28/2013 regarding flank pain. CT abdomen and pelvis with contrast performed with findings of possible gastric antritis. Patient was discharged home. Alert oriented. GCS 15. Heart rate and rhythm normal. Lungs good auscultation to upper lower lobes bilaterally. Radial pulses 2+ bilaterally. DP pulses 2+ bilaterally. Cap refill less than 3 seconds. Bowel sounds normal active in all 4 quadrants, soft upon palpation. Discomfort upon palpation to the bilateral lower quadrants-left lower quadrant and right lower quadrant. Discomfort upon palpation to suprapubic region. Positive McBurney's point. Negative Murphy's sign. Negative bilateral CVA tenderness. Pelvic exam noted a remarkable findings-negative CMT, negative bilateral adnexal tenderness. Rectal exam unremarkable-negative blood on glove, brown stool on glove-negative palpation of masses. CBC negative elevated white blood cell count noted-negative leukocytes, negative left shift. Negtaive drop in Hgb and Hct - doubt acute bleed. Fecal occult negative. CMP negative findings. Lipase negative elevation. Urine pregnancy negative. Urinalysis negative for infection-negative nitrites or leukocytes noted-negative hemoglobin. Wet prep negative findings. GC/Chlamydia probe pending. Ultrasound of pelvis negative for ovarian portion. Unremarkable pelvic findings on ultrasound.  Doubt pancreatitis.  Doubt ovarian torsion. Doubt PID. Doubt TOA. Negative findings for vaginal infection. CT abdomen and pelvis pending. Discussed case with Mellody Drown, PA-C. Transfer of care to Mellody Drown, PA-C at change in shift.     Raymon Mutton, PA-C 12/16/13 [address] PA-C 12/16/13 1554

## 2013-12-16 ENCOUNTER — Emergency Department (HOSPITAL_COMMUNITY): Payer: BC Managed Care – PPO

## 2013-12-16 ENCOUNTER — Encounter (HOSPITAL_COMMUNITY): Payer: Self-pay

## 2013-12-16 LAB — CBC WITH DIFFERENTIAL/PLATELET
BASOS PCT: 0 % (ref 0–1)
Basophils Absolute: 0 10*3/uL (ref 0.0–0.1)
EOS ABS: 0.1 10*3/uL (ref 0.0–0.7)
Eosinophils Relative: 1 % (ref 0–5)
HCT: 38.3 % (ref 36.0–46.0)
Hemoglobin: 12.9 g/dL (ref 12.0–15.0)
LYMPHS ABS: 3.1 10*3/uL (ref 0.7–4.0)
Lymphocytes Relative: 34 % (ref 12–46)
MCH: 32 pg (ref 26.0–34.0)
MCHC: 33.7 g/dL (ref 30.0–36.0)
MCV: 95 fL (ref 78.0–100.0)
Monocytes Absolute: 0.6 10*3/uL (ref 0.1–1.0)
Monocytes Relative: 6 % (ref 3–12)
Neutro Abs: 5.2 10*3/uL (ref 1.7–7.7)
Neutrophils Relative %: 58 % (ref 43–77)
PLATELETS: 262 10*3/uL (ref 150–400)
RBC: 4.03 MIL/uL (ref 3.87–5.11)
RDW: 13.1 % (ref 11.5–15.5)
WBC: 8.9 10*3/uL (ref 4.0–10.5)

## 2013-12-16 LAB — URINALYSIS, ROUTINE W REFLEX MICROSCOPIC
BILIRUBIN URINE: NEGATIVE
GLUCOSE, UA: NEGATIVE mg/dL
Hgb urine dipstick: NEGATIVE
Ketones, ur: NEGATIVE mg/dL
Leukocytes, UA: NEGATIVE
NITRITE: NEGATIVE
PH: 6 (ref 5.0–8.0)
Protein, ur: NEGATIVE mg/dL
SPECIFIC GRAVITY, URINE: 1.024 (ref 1.005–1.030)
Urobilinogen, UA: 1 mg/dL (ref 0.0–1.0)

## 2013-12-16 LAB — WET PREP, GENITAL
Clue Cells Wet Prep HPF POC: NONE SEEN
TRICH WET PREP: NONE SEEN
WBC WET PREP: NONE SEEN
YEAST WET PREP: NONE SEEN

## 2013-12-16 LAB — GC/CHLAMYDIA PROBE AMP
CT Probe RNA: NEGATIVE
GC Probe RNA: NEGATIVE

## 2013-12-16 LAB — COMPREHENSIVE METABOLIC PANEL
ALK PHOS: 56 U/L (ref 39–117)
ALT: 16 U/L (ref 0–35)
AST: 19 U/L (ref 0–37)
Albumin: 3.8 g/dL (ref 3.5–5.2)
BUN: 21 mg/dL (ref 6–23)
CALCIUM: 9.3 mg/dL (ref 8.4–10.5)
CO2: 21 mEq/L (ref 19–32)
Chloride: 101 mEq/L (ref 96–112)
Creatinine, Ser: 0.67 mg/dL (ref 0.50–1.10)
GFR calc non Af Amer: >90 mL/min (ref >90)
GLUCOSE: 101 mg/dL — AB (ref 70–99)
Potassium: 4.1 mEq/L (ref 3.7–5.3)
SODIUM: 138 meq/L (ref 137–147)
TOTAL PROTEIN: 7.2 g/dL (ref 6.0–8.3)
Total Bilirubin: <0.2 mg/dL — ABNORMAL LOW (ref 0.3–1.2)

## 2013-12-16 LAB — LIPASE, BLOOD: LIPASE: 18 U/L (ref 11–59)

## 2013-12-16 LAB — PREGNANCY, URINE: Preg Test, Ur: NEGATIVE

## 2013-12-16 LAB — POC OCCULT BLOOD, ED: Fecal Occult Bld: NEGATIVE

## 2013-12-16 MED ORDER — OMEPRAZOLE 20 MG PO CPDR: 20.0000 mg | DELAYED_RELEASE_CAPSULE | Freq: Every day | ORAL | Status: DC

## 2013-12-16 MED ORDER — IOHEXOL 300 MG/ML  SOLN
50.0000 mL | Freq: Once | INTRAMUSCULAR | Status: AC | PRN
  Administered 2013-12-16: 50 mL via ORAL

## 2013-12-16 MED ORDER — ONDANSETRON HCL 4 MG/2ML IJ SOLN
4.0000 mg | Freq: Once | INTRAMUSCULAR | Status: AC
  Administered 2013-12-16: 4 mg via INTRAVENOUS
  Filled 2013-12-16: qty 2

## 2013-12-16 MED ORDER — SODIUM CHLORIDE 0.9 % IV BOLUS (SEPSIS)
1000.0000 mL | Freq: Once | INTRAVENOUS | Status: AC
  Administered 2013-12-16: 1000 mL via INTRAVENOUS

## 2013-12-16 MED ORDER — IOHEXOL 300 MG/ML  SOLN
100.0000 mL | Freq: Once | INTRAMUSCULAR | Status: AC | PRN
  Administered 2013-12-16: 100 mL via INTRAVENOUS

## 2013-12-16 MED ORDER — MORPHINE SULFATE 2 MG/ML IJ SOLN
2.0000 mg | Freq: Once | INTRAMUSCULAR | Status: AC
  Administered 2013-12-16: 2 mg via INTRAVENOUS
  Filled 2013-12-16: qty 1

## 2013-12-16 NOTE — ED Provider Notes (Signed)
0315: Patient care [facility] from [facility] Facility[facility] Sciacca, PA-C Patient is a 38 year old female presenting with lower abdominal pain for several days, worsened by food, and reports dark stools.  Labs and US unremarkable. Awaiting CT results.  Likely PUD.  Results for orders placed during the hospital encounter of 12/15/13  WET PREP, GENITAL      Result Value Ref Range   Yeast Wet Prep HPF POC NONE SEEN  NONE SEEN   Trich, Wet Prep NONE SEEN  NONE SEEN   Clue Cells Wet Prep HPF POC NONE SEEN  NONE SEEN   WBC, Wet Prep HPF POC NONE SEEN  NONE SEEN  CBC WITH DIFFERENTIAL      Result Value Ref Range   WBC 8.9  4.0 - 10.5 K/uL   RBC 4.03  3.87 - 5.11 MIL/uL   Hemoglobin 12.9  12.0 - 15.0 g/dL   HCT 78.438.3  69.636.0 - 29.546.0 %   MCV 95.0  78.0 - 100.0 fL   MCH 32.0  26.0 - 34.0 pg   MCHC 33.7  30.0 - 36.0 g/dL   RDW 28.413.1  13.211.5 - 44.015.5 %   Platelets 262  150 - 400 K/uL   Neutrophils Relative % 58  43 - 77 %   Neutro Abs 5.2  1.7 - 7.7 K/uL   Lymphocytes Relative 34  12 - 46 %   Lymphs Abs 3.1  0.7 - 4.0 K/uL   Monocytes Relative 6  3 - 12 %   Monocytes Absolute 0.6  0.1 - 1.0 K/uL   Eosinophils Relative 1  0 - 5 %   Eosinophils Absolute 0.1  0.0 - 0.7 K/uL   Basophils Relative 0  0 - 1 %   Basophils Absolute 0.0  0.0 - 0.1 K/uL  COMPREHENSIVE METABOLIC PANEL      Result Value Ref Range   Sodium 138  137 - 147 mEq/L   Potassium 4.1  3.7 - 5.3 mEq/L   Chloride 101  96 - 112 mEq/L   CO2 21  19 - 32 mEq/L   Glucose, Bld 101 (*) 70 - 99 mg/dL   BUN 21  6 - 23 mg/dL   Creatinine, Ser 1.020.67  0.50 - 1.10 mg/dL   Calcium 9.3  8.4 - 72.510.5 mg/dL   Total Protein 7.2  6.0 - 8.3 g/dL   Albumin 3.8  3.5 - 5.2 g/dL   AST 19  0 - 37 U/L   ALT 16  0 - 35 U/L   Alkaline Phosphatase 56  39 - 117 U/L   Total Bilirubin <0.2 (*) 0.3 - 1.2 mg/dL   GFR calc non Af Amer >90  >90 mL/min   GFR calc Af Amer >90  >90 mL/min  URINALYSIS, ROUTINE W REFLEX MICROSCOPIC      Result Value Ref Range   Color, Urine YELLOW  YELLOW    APPearance CLEAR  CLEAR   Specific Gravity, Urine 1.024  1.005 - 1.030   pH 6.0  5.0 - 8.0   Glucose, UA NEGATIVE  NEGATIVE mg/dL   Hgb urine dipstick NEGATIVE  NEGATIVE   Bilirubin Urine NEGATIVE  NEGATIVE   Ketones, ur NEGATIVE  NEGATIVE mg/dL   Protein, ur NEGATIVE  NEGATIVE mg/dL   Urobilinogen, UA 1.0  0.0 - 1.0 mg/dL   Nitrite NEGATIVE  NEGATIVE   Leukocytes, UA NEGATIVE  NEGATIVE  PREGNANCY, URINE      Result Value Ref Range   Preg Test, Ur NEGATIVE  NEGATIVE  LIPASE, BLOOD      Result Value Ref Range   Lipase 18  11 - 59 U/L   US Transvaginal Non-ob  12/16/2013   CLINICAL DATA:  38 year old female with pelvic pain.  EXAM: TRANSABDOMINAL AND TRANSVAGINAL ULTRASOUND OF PELVIS  DOPPLER ULTRASOUND OF OVARIES  TECHNIQUE: Both transabdominal and transvaginal ultrasound examinations of the pelvis were performed. Transabdominal technique was performed for global imaging of the pelvis including uterus, ovaries, adnexal regions, and pelvic cul-de-sac.  It was necessary to proceed with endovaginal exam following the transabdominal exam to visualize the ovaries and endometrium. Color and duplex Doppler ultrasound was utilized to evaluate blood flow to the ovaries.  COMPARISON:  10/28/2013 CT  FINDINGS: Uterus  Measurements: Anteverted measuring 8.6 x 3.6 x 4.4 cm. Uterine echotexture is slightly heterogeneous. No fibroids or other mass visualized.  Endometrium  Thickness: 9 mm.  No focal abnormality visualized.  Right ovary  Measurements: 2.2 x 2.5 x 2.6 cm. Normal appearance/no adnexal mass.  Left ovary  Measurements: 3.1 x 2.2 x 3.2 cm. Normal appearance/no adnexal mass.  Pulsed Doppler evaluation of both ovaries demonstrates normal low-resistance arterial and venous waveforms.  Other findings  No free fluid or adnexal mass identified.  IMPRESSION: Unremarkable pelvic ultrasound. No evidence of ovarian torsion or mass.   Electronically Signed   By: Laveda Abbe M.D.   On: 12/16/2013 01:31   US  Pelvis Complete  12/16/2013   CLINICAL DATA:  38 year old female with pelvic pain.  EXAM: TRANSABDOMINAL AND TRANSVAGINAL ULTRASOUND OF PELVIS  DOPPLER ULTRASOUND OF OVARIES  TECHNIQUE: Both transabdominal and transvaginal ultrasound examinations of the pelvis were performed. Transabdominal technique was performed for global imaging of the pelvis including uterus, ovaries, adnexal regions, and pelvic cul-de-sac.  It was necessary to proceed with endovaginal exam following the transabdominal exam to visualize the ovaries and endometrium. Color and duplex Doppler ultrasound was utilized to evaluate blood flow to the ovaries.  COMPARISON:  10/28/2013 CT  FINDINGS: Uterus  Measurements: Anteverted measuring 8.6 x 3.6 x 4.4 cm. Uterine echotexture is slightly heterogeneous. No fibroids or other mass visualized.  Endometrium  Thickness: 9 mm.  No focal abnormality visualized.  Right ovary  Measurements: 2.2 x 2.5 x 2.6 cm. Normal appearance/no adnexal mass.  Left ovary  Measurements: 3.1 x 2.2 x 3.2 cm. Normal appearance/no adnexal mass.  Pulsed Doppler evaluation of both ovaries demonstrates normal low-resistance arterial and venous waveforms.  Other findings  No free fluid or adnexal mass identified.  IMPRESSION: Unremarkable pelvic ultrasound. No evidence of ovarian torsion or mass.   Electronically Signed   By: Laveda Abbe M.D.   On: 12/16/2013 01:31   Ct Abdomen Pelvis W Contrast  12/16/2013   CLINICAL DATA:  Abdominal pain, nausea, dark stools for 1 week. History of ruptured ovarian cyst and appendectomy.  EXAM: CT ABDOMEN AND PELVIS WITH CONTRAST  TECHNIQUE: Multidetector CT imaging of the abdomen and pelvis was performed using the standard protocol following bolus administration of intravenous contrast.  CONTRAST:  OMNIPAQUE IOHEXOL 300 MG/ML  SOLN  COMPARISON:  US PELVIS COMPLETE dated 12/16/2013; CT ABD/PELVIS W CM dated 10/28/2013  FINDINGS: Included view of the lung bases are clear. Visualized heart and  pericardium are unremarkable.  The liver, spleen, gallbladder, pancreas and adrenal glands are unremarkable.  The stomach, small and large bowel are normal in course and caliber without inflammatory changes. Fractured since though improved distal stomach wall thickening without pneumatosis. Status post appendectomy. No intraperitoneal free  air.  Kidneys are orthotopic, demonstrating symmetric enhancement without nephrolithiasis, hydronephrosis or renal masses. The unopacified ureters are normal in course and caliber. Delayed imaging through the kidneys demonstrates symmetric prompt excretion to the proximal urinary collecting system. Urinary bladder is partially distended and unremarkable.  Great vessels are normal in course and caliber. No lymphadenopathy by CT size criteria. Cervical edema versus small amount of free fluid about the cervix, axial 66/77. Moderate L5-S1 degenerative disc disease with minimal neural foraminal narrowing. . Partially imaged breast implants.  IMPRESSION: Improved residual gastric wall thickening may reflect resolving gastritis.  Edema versus small amount of free fluid about the cervix, consider correlation with gynecological examination.   Electronically Signed   By: Awilda Metro   On: 12/16/2013 03:26   [place] Art/ven Flow Abd Pelv Doppler  12/16/2013   CLINICAL DATA:  38 year old female with pelvic pain.  EXAM: TRANSABDOMINAL AND TRANSVAGINAL ULTRASOUND OF PELVIS  DOPPLER ULTRASOUND OF OVARIES  TECHNIQUE: Both transabdominal and transvaginal ultrasound examinations of the pelvis were performed. Transabdominal technique was performed for global imaging of the pelvis including uterus, ovaries, adnexal regions, and pelvic cul-de-sac.  It was necessary to proceed with endovaginal exam following the transabdominal exam to visualize the ovaries and endometrium. Color and duplex Doppler ultrasound was utilized to evaluate blood flow to the ovaries.  COMPARISON:  10/28/2013 CT  FINDINGS:  Uterus  Measurements: Anteverted measuring 8.6 x 3.6 x 4.4 cm. Uterine echotexture is slightly heterogeneous. No fibroids or other mass visualized.  Endometrium  Thickness: 9 mm.  No focal abnormality visualized.  Right ovary  Measurements: 2.2 x 2.5 x 2.6 cm. Normal appearance/no adnexal mass.  Left ovary  Measurements: 3.1 x 2.2 x 3.2 cm. Normal appearance/no adnexal mass.  Pulsed Doppler evaluation of both ovaries demonstrates normal low-resistance arterial and venous waveforms.  Other findings  No free fluid or adnexal mass identified.  IMPRESSION: Unremarkable pelvic ultrasound. No evidence of ovarian torsion or mass.   Electronically Signed   By: Laveda Abbe M.D.   On: 12/16/2013 01:31   MDM: Clinical Impression: Lower abdominal pain  Abnormal cervix finding GERD   CT shows small amount of free fluid vs edema about the cervix. Will have the patient follow up with OB/GYN for a Pap smear.  Will start the pateint on PPI given PUD symptoms and follow up with GI. Discussed lab results, imaging results, and treatment plan with the patient. Return precautions given. Reports understanding and no other concerns at this time.  Patient is stable for discharge at this time.    Clabe Seal, PA-C 12/16/13 [phone]

## 2013-12-16 NOTE — ED Notes (Signed)
Pt appears to e anxious and hyperventilating.  Pt reports headache has got some what better but abdominal pain is still the same. PA notified. HR 87, Oxygen 98% RA

## 2013-12-16 NOTE — ED Provider Notes (Signed)
Medical screening examination/treatment/procedure(s) were performed by non-physician practitioner and as supervising physician I was immediately available for consultation/collaboration.  EKG Interpretation   None         Melanie SkeensJoshua M Almena Hokenson, MD 12/16/13 [phone]

## 2013-12-16 NOTE — Discharge Instructions (Signed)
Call for a follow up appointment with a Family or Primary Care Provider.  Call Dr. Nicholes MangoMann-Hung for further evaluation of your possible ulcer, and GI symptoms. Call [facility] clinic for an appointment for a women's yearly pap smear. Return if Symptoms worsen.   Take medication as prescribed.

## 2013-12-17 NOTE — ED Provider Notes (Signed)
Medical screening examination/treatment/procedure(s) were performed by non-physician practitioner and as supervising physician I was immediately available for consultation/collaboration.  EKG Interpretation   None         Enid SkeensJoshua M Annelle Behrendt, MD 12/17/13 [phone]

## 2014-09-03 ENCOUNTER — Encounter (HOSPITAL_COMMUNITY): Payer: Self-pay | Admitting: Emergency Medicine

## 2014-09-03 ENCOUNTER — Emergency Department (HOSPITAL_COMMUNITY)
Admission: EM | Admit: 2014-09-03 | Discharge: 2014-09-04 | Disposition: A | Discharge status: Home / Self Care | Payer: BC Managed Care – PPO | Attending: Emergency Medicine | Admitting: Emergency Medicine

## 2014-09-03 DIAGNOSIS — Z8742 Personal history of other diseases of the female genital tract: Secondary | ICD-10-CM | POA: Insufficient documentation

## 2014-09-03 DIAGNOSIS — K2970 Gastritis, unspecified, without bleeding: Secondary | ICD-10-CM | POA: Insufficient documentation

## 2014-09-03 DIAGNOSIS — Z720 Tobacco use: Secondary | ICD-10-CM | POA: Insufficient documentation

## 2014-09-03 DIAGNOSIS — Z3202 Encounter for pregnancy test, result negative: Secondary | ICD-10-CM | POA: Insufficient documentation

## 2014-09-03 DIAGNOSIS — R109 Unspecified abdominal pain: Secondary | ICD-10-CM

## 2014-09-03 DIAGNOSIS — Z862 Personal history of diseases of the blood and blood-forming organs and certain disorders involving the immune mechanism: Secondary | ICD-10-CM | POA: Insufficient documentation

## 2014-09-03 LAB — URINALYSIS, ROUTINE W REFLEX MICROSCOPIC
Bilirubin Urine: NEGATIVE
Glucose, UA: NEGATIVE mg/dL
Hgb urine dipstick: NEGATIVE
Ketones, ur: 15 mg/dL — AB
Leukocytes, UA: NEGATIVE
Nitrite: NEGATIVE
PROTEIN: NEGATIVE mg/dL
Specific Gravity, Urine: 1.03 (ref 1.005–1.030)
UROBILINOGEN UA: 0.2 mg/dL (ref 0.0–1.0)
pH: 5.5 (ref 5.0–8.0)

## 2014-09-03 LAB — LIPASE, BLOOD: LIPASE: 28 U/L (ref 11–59)

## 2014-09-03 LAB — CBC WITH DIFFERENTIAL/PLATELET
Basophils Absolute: 0 10*3/uL (ref 0.0–0.1)
Basophils Relative: 0 % (ref 0–1)
EOS PCT: 1 % (ref 0–5)
Eosinophils Absolute: 0.1 10*3/uL (ref 0.0–0.7)
HEMATOCRIT: 42.5 % (ref 36.0–46.0)
Hemoglobin: 14.1 g/dL (ref 12.0–15.0)
LYMPHS ABS: 3.1 10*3/uL (ref 0.7–4.0)
Lymphocytes Relative: 31 % (ref 12–46)
MCH: 31.6 pg (ref 26.0–34.0)
MCHC: 33.2 g/dL (ref 30.0–36.0)
MCV: 95.3 fL (ref 78.0–100.0)
MONO ABS: 0.8 10*3/uL (ref 0.1–1.0)
MONOS PCT: 8 % (ref 3–12)
Neutro Abs: 6.2 10*3/uL (ref 1.7–7.7)
Neutrophils Relative %: 60 % (ref 43–77)
Platelets: 278 10*3/uL (ref 150–400)
RBC: 4.46 MIL/uL (ref 3.87–5.11)
RDW: 13.4 % (ref 11.5–15.5)
WBC: 10.1 10*3/uL (ref 4.0–10.5)

## 2014-09-03 LAB — COMPREHENSIVE METABOLIC PANEL
ALT: 14 U/L (ref 0–35)
AST: 21 U/L (ref 0–37)
Albumin: 4 g/dL (ref 3.5–5.2)
Alkaline Phosphatase: 65 U/L (ref 39–117)
Anion gap: 13 (ref 5–15)
BUN: 18 mg/dL (ref 6–23)
CALCIUM: 10 mg/dL (ref 8.4–10.5)
CO2: 23 meq/L (ref 19–32)
CREATININE: 0.63 mg/dL (ref 0.50–1.10)
Chloride: 99 mEq/L (ref 96–112)
GFR calc Af Amer: >90 mL/min (ref >90)
GLUCOSE: 103 mg/dL — AB (ref 70–99)
Potassium: 4.3 mEq/L (ref 3.7–5.3)
Sodium: 135 mEq/L — ABNORMAL LOW (ref 137–147)
Total Bilirubin: 0.3 mg/dL (ref 0.3–1.2)
Total Protein: 7.6 g/dL (ref 6.0–8.3)

## 2014-09-03 LAB — PREGNANCY, URINE: PREG TEST UR: NEGATIVE

## 2014-09-03 MED ORDER — IOHEXOL 300 MG/ML  SOLN
25.0000 mL | Freq: Once | INTRAMUSCULAR | Status: AC | PRN
  Administered 2014-09-03: 25 mL via ORAL

## 2014-09-03 NOTE — ED Notes (Signed)
Pt. reports pain across her abdomen for 3 weeks with nausea and chills, denies emesis or diarrhea , no urinary discomfort , denies fever or chills .

## 2014-09-03 NOTE — ED Provider Notes (Signed)
CSN: [id number]     Arrival date & time 09/03/14  2041 History   First MD Initiated Contact with Patient 09/03/14 2305     Chief Complaint  Patient presents with  . Abdominal Pain     (Consider location/radiation/quality/duration/timing/severity/associated sxs/prior Treatment) HPI Patient presents with 3 weeks of lower abdominal pain. She states the pain has been constant but worse this evening. She denies any nausea, vomiting, diarrhea or constipation. No urinary symptoms including dysuria, frequency, hematuria or urgency. Denies any vaginal bleeding or discharge. Seen earlier this year for similar symptoms and diagnosed with gastric ulcers and ovarian cysts. She's had no fever or chills. Past Medical History  Diagnosis Date  . Anemia   . Irregular heart rate   . Ovarian cyst    Past Surgical History  Procedure Laterality Date  . Appendectomy    . Breast enhancement surgery     No family history on file. History  Substance Use Topics  . Smoking status: Current Every Day Smoker -- 1.00 packs/day    Types: Cigarettes  . Smokeless tobacco: Never Used  . Alcohol Use: Yes     Comment: occassional   OB History    No data available     Review of Systems  Constitutional: Negative for fever and chills.  Respiratory: Negative for shortness of breath.   Cardiovascular: Negative for chest pain.  Gastrointestinal: Positive for abdominal pain. Negative for nausea, vomiting, diarrhea, constipation and blood in stool.  Genitourinary: Negative for dysuria, frequency, hematuria, flank pain, vaginal bleeding and vaginal discharge.  Musculoskeletal: Negative for myalgias, back pain, neck pain and neck stiffness.  Skin: Negative for rash and wound.  Neurological: Negative for dizziness, weakness, light-headedness, numbness and headaches.  All other systems reviewed and are negative.     Allergies  Dilaudid  Home Medications   Prior to Admission medications   Medication Sig Start  Date End Date Taking? Authorizing Provider  Aspirin-Salicylamide-Caffeine (BC HEADACHE POWDER PO) Take 1 packet by mouth daily as needed (headache / pain).   Yes Historical Provider, MD  HYDROcodone-acetaminophen (NORCO) 5-325 MG per tablet Take 1 tablet by mouth every 4 (four) hours as needed for moderate pain or severe pain. 09/04/14   Loren Raceravid Siaosi Alter, MD  omeprazole (PRILOSEC) 20 MG capsule Take 1 capsule (20 mg total) by mouth daily. 09/04/14   Loren Raceravid Lindel Marcell, MD  sucralfate (CARAFATE) 1 G tablet Take 1 tablet (1 g total) by mouth 3 (three) times daily as needed (abdominal pain). 09/04/14   Loren Raceravid Shakeita Vandevander, MD   BP 116/81 mmHg  Pulse 78  Temp(Src) 98.5 F (36.9 C)  Resp 27  SpO2 99%  LMP 08/19/2014 Physical Exam  Constitutional: She is oriented to person, place, and time. She appears well-developed and well-nourished. No distress.  HENT:  Head: Normocephalic and atraumatic.  Mouth/Throat: Oropharynx is clear and moist.  Eyes: EOM are normal. Pupils are equal, round, and reactive to light.  Neck: Normal range of motion. Neck supple.  Cardiovascular: Normal rate and regular rhythm.   Pulmonary/Chest: Effort normal and breath sounds normal. No respiratory distress. She has no wheezes. She has no rales.  Abdominal: Soft. Bowel sounds are normal. She exhibits no distension and no mass. There is tenderness (tenderness to palpationin the periumbilical region worse on the right compared to left. There is no rebound or guarding.). There is no rebound and no guarding.  Musculoskeletal: Normal range of motion. She exhibits no edema or tenderness.  No CVA tenderness bilaterally.  Neurological: She  is alert and oriented to person, place, and time.  Skin: Skin is warm and dry. No rash noted. No erythema.  Psychiatric: She has a normal mood and affect. Her behavior is normal.  Nursing note and vitals reviewed.   ED Course  Procedures (including critical care time) Labs Review Labs Reviewed   COMPREHENSIVE METABOLIC PANEL - Abnormal; Notable for the following:    Sodium 135 (*)    Glucose, Bld 103 (*)    All other components within normal limits  URINALYSIS, ROUTINE W REFLEX MICROSCOPIC - Abnormal; Notable for the following:    APPearance CLOUDY (*)    Ketones, ur 15 (*)    All other components within normal limits  CBC WITH DIFFERENTIAL  LIPASE, BLOOD  PREGNANCY, URINE    Imaging Review Ct Abdomen Pelvis W Contrast  09/04/2014   CLINICAL DATA:  Severe abdominal pain. Pain across the abdomen radiating to the right side.  EXAM: CT ABDOMEN AND PELVIS WITH CONTRAST  TECHNIQUE: Multidetector CT imaging of the abdomen and pelvis was performed using the standard protocol following bolus administration of intravenous contrast.  CONTRAST:  100mL OMNIPAQUE IOHEXOL 300 MG/ML  SOLN  COMPARISON:  12/16/2013  FINDINGS: The lung bases are clear. Partially visualized are bilateral breast implants.  The liver demonstrates no focal abnormality. There is no intrahepatic or extrahepatic biliary ductal dilatation. The gallbladder is normal. The spleen demonstrates no focal abnormality. The kidneys, adrenal glands and pancreas are normal. The bladder is unremarkable.  There is relative wall thickening of the stomach which maybe related to underdistention versus gastritis. The duodenum, small intestine, and large intestine demonstrate no bowel dilatation. The appendix is surgically absent. There is no pneumoperitoneum, pneumatosis, or portal venous gas. There is no abdominal or pelvic free fluid. There is no lymphadenopathy.  The abdominal aorta is normal in caliber .  There are no lytic or sclerotic osseous lesions.  IMPRESSION: 1. Relative wall thickening of the stomach which maybe related to underdistention versus gastritis.   Electronically Signed   By: Elige KoHetal  Patel   On: 09/04/2014 01:15     EKG Interpretation None      MDM   Final diagnoses:  Right sided abdominal pain  Gastritis    Abdomen remains soft. No rebound or guarding. Labs within normal limits. CT with gastric wall thickening consistent with gastritis. Patient advised to continue her Prilosec. We'll also start Carafate. Patient is advised to follow-up with gastric nausea and her symptoms continue. She's been given return precautions and has voiced understanding.     Loren Raceravid Kristina Bertone, MD 09/05/14 [phone]

## 2014-09-04 ENCOUNTER — Encounter (HOSPITAL_COMMUNITY): Payer: Self-pay

## 2014-09-04 ENCOUNTER — Emergency Department (HOSPITAL_COMMUNITY): Payer: BC Managed Care – PPO

## 2014-09-04 MED ORDER — HYDROCODONE-ACETAMINOPHEN 5-325 MG PO TABS
1.0000 | ORAL_TABLET | Freq: Once | ORAL | Status: AC
  Administered 2014-09-04: 1 via ORAL
  Filled 2014-09-04: qty 1

## 2014-09-04 MED ORDER — IOHEXOL 300 MG/ML  SOLN
100.0000 mL | Freq: Once | INTRAMUSCULAR | Status: AC | PRN
  Administered 2014-09-04: 100 mL via INTRAVENOUS

## 2014-09-04 MED ORDER — MORPHINE SULFATE 4 MG/ML IJ SOLN
4.0000 mg | Freq: Once | INTRAMUSCULAR | Status: AC
  Administered 2014-09-04: 4 mg via INTRAVENOUS
  Filled 2014-09-04: qty 1

## 2014-09-04 MED ORDER — SUCRALFATE 1 G PO TABS
1.0000 g | ORAL_TABLET | Freq: Three times a day (TID) | ORAL | Status: DC | PRN
Start: 2014-09-04 — End: 2015-09-27

## 2014-09-04 MED ORDER — OMEPRAZOLE 20 MG PO CPDR: 20.0000 mg | DELAYED_RELEASE_CAPSULE | Freq: Every day | ORAL | Status: DC

## 2014-09-04 MED ORDER — GI COCKTAIL ~~LOC~~
30.0000 mL | Freq: Once | ORAL | Status: AC
  Administered 2014-09-04: 30 mL via ORAL
  Filled 2014-09-04: qty 30

## 2014-09-04 MED ORDER — HYDROCODONE-ACETAMINOPHEN 5-325 MG PO TABS: 1.0000 | ORAL_TABLET | ORAL | Status: DC | PRN

## 2014-09-04 NOTE — Discharge Instructions (Signed)
Abdominal Pain, Women °Abdominal (stomach, pelvic, or belly) pain can be caused by many things. It is important to tell your doctor: °· The location of the pain. °· Does it come and go or is it present all the time? °· Are there things that start the pain (eating certain foods, exercise)? °· Are there other symptoms associated with the pain (fever, nausea, vomiting, diarrhea)? °All of this is helpful to know when trying to find the cause of the pain. °CAUSES  °· Stomach: virus or bacteria infection, or ulcer. °· Intestine: appendicitis (inflamed appendix), regional ileitis (Crohn's disease), ulcerative colitis (inflamed colon), irritable bowel syndrome, diverticulitis (inflamed diverticulum of the colon), or cancer of the stomach or intestine. °· Gallbladder disease or stones in the gallbladder. °· Kidney disease, kidney stones, or infection. °· Pancreas infection or cancer. °· Fibromyalgia (pain disorder). °· Diseases of the female organs: °¨ Uterus: fibroid (non-cancerous) tumors or infection. °¨ Fallopian tubes: infection or tubal pregnancy. °¨ Ovary: cysts or tumors. °¨ Pelvic adhesions (scar tissue). °¨ Endometriosis (uterus lining tissue growing in the pelvis and on the pelvic organs). °¨ Pelvic congestion syndrome (female organs filling up with blood just before the menstrual period). °¨ Pain with the menstrual period. °¨ Pain with ovulation (producing an egg). °¨ Pain with an IUD (intrauterine device, birth control) in the uterus. °¨ Cancer of the female organs. °· Functional pain (pain not caused by a disease, may improve without treatment). °· Psychological pain. °· Depression. °DIAGNOSIS  °Your doctor will decide the seriousness of your pain by doing an examination. °· Blood tests. °· X-rays. °· Ultrasound. °· CT scan (computed tomography, special type of X-ray). °· MRI (magnetic resonance imaging). °· Cultures, for infection. °· Barium enema (dye inserted in the large intestine, to better view it with  X-rays). °· Colonoscopy (looking in intestine with a lighted tube). °· Laparoscopy (minor surgery, looking in abdomen with a lighted tube). °· Major abdominal exploratory surgery (looking in abdomen with a large incision). °TREATMENT  °The treatment will depend on the cause of the pain.  °· Many cases can be observed and treated at home. °· Over-the-counter medicines recommended by your caregiver. °· Prescription medicine. °· Antibiotics, for infection. °· Birth control pills, for painful periods or for ovulation pain. °· Hormone treatment, for endometriosis. °· Nerve blocking injections. °· Physical therapy. °· Antidepressants. °· Counseling with a psychologist or psychiatrist. °· Minor or major surgery. °HOME CARE INSTRUCTIONS  °· Do not take laxatives, unless directed by your caregiver. °· Take over-the-counter pain medicine only if ordered by your caregiver. Do not take aspirin because it can cause an upset stomach or bleeding. °· Try a clear liquid diet (broth or water) as ordered by your caregiver. Slowly move to a bland diet, as tolerated, if the pain is related to the stomach or intestine. °· Have a thermometer and take your temperature several times a day, and record it. °· Bed rest and sleep, if it helps the pain. °· Avoid sexual intercourse, if it causes pain. °· Avoid stressful situations. °· Keep your follow-up appointments and tests, as your caregiver orders. °· If the pain does not go away with medicine or surgery, you may try: °¨ Acupuncture. °¨ Relaxation exercises (yoga, meditation). °¨ Group therapy. °¨ Counseling. °SEEK MEDICAL CARE IF:  °· You notice certain foods cause stomach pain. °· Your home care treatment is not helping your pain. °· You need stronger pain medicine. °· You want your IUD removed. °· You feel faint or   lightheaded. °· You develop nausea and vomiting. °· You develop a rash. °· You are having side effects or an allergy to your medicine. °SEEK IMMEDIATE MEDICAL CARE IF:  °· Your  pain does not go away or gets worse. °· You have a fever. °· Your pain is felt only in portions of the abdomen. The right side could possibly be appendicitis. The left lower portion of the abdomen could be colitis or diverticulitis. °· You are passing blood in your stools (bright red or black tarry stools, with or without vomiting). °· You have blood in your urine. °· You develop chills, with or without a fever. °· You pass out. °MAKE SURE YOU:  °· Understand these instructions. °· Will watch your condition. °· Will get help right away if you are not doing well or get worse. °Document Released: 08/05/2007 Document Revised: 02/22/2014 Document Reviewed: 08/25/2009 °Exit[employer]® Patient Information ©2015 [employer]. This information is not intended to replace advice given to you by your health care provider. Make sure you discuss any questions you have with your health care provider. ° °Gastritis, Adult °Gastritis is soreness and swelling (inflammation) of the lining of the stomach. Gastritis can develop as a sudden onset (acute) or long-term (chronic) condition. If gastritis is not treated, it can lead to stomach bleeding and ulcers. °CAUSES  °Gastritis occurs when the stomach lining is weak or damaged. Digestive juices from the stomach then inflame the weakened stomach lining. The stomach lining may be weak or damaged due to viral or bacterial infections. One common bacterial infection is the Helicobacter pylori infection. Gastritis can also result from excessive alcohol consumption, taking certain medicines, or having too much acid in the stomach.  °SYMPTOMS  °In some cases, there are no symptoms. When symptoms are present, they may include: °· Pain or a burning sensation in the upper abdomen. °· Nausea. °· Vomiting. °· An uncomfortable feeling of fullness after eating. °DIAGNOSIS  °Your caregiver may suspect you have gastritis based on your symptoms and a physical exam. To determine the cause of your gastritis,  your caregiver may perform the following: °· Blood or stool tests to check for the H pylori bacterium. °· Gastroscopy. A thin, flexible tube (endoscope) is passed down the esophagus and into the stomach. The endoscope has a light and camera on the end. Your caregiver uses the endoscope to view the inside of the stomach. °· Taking a tissue sample (biopsy) from the stomach to examine under a microscope. °TREATMENT  °Depending on the cause of your gastritis, medicines may be prescribed. If you have a bacterial infection, such as an H pylori infection, antibiotics may be given. If your gastritis is caused by too much acid in the stomach, H2 blockers or antacids may be given. Your caregiver may recommend that you stop taking aspirin, ibuprofen, or other nonsteroidal anti-inflammatory drugs (NSAIDs). °HOME CARE INSTRUCTIONS °· Only take over-the-counter or prescription medicines as directed by your caregiver. °· If you were given antibiotic medicines, take them as directed. Finish them even if you start to feel better. °· Drink enough fluids to keep your urine clear or pale yellow. °· Avoid foods and drinks that make your symptoms worse, such as: °¨ Caffeine or alcoholic drinks. °¨ Chocolate. °¨ Peppermint or mint flavorings. °¨ Garlic and onions. °¨ Spicy foods. °¨ Citrus fruits, such as oranges, lemons, or limes. °¨ Tomato-based foods such as sauce, chili, salsa, and pizza. °¨ Fried and fatty foods. °· Eat small, frequent meals instead of large meals. °  SEEK IMMEDIATE MEDICAL CARE IF:  °· You have black or dark red stools. °· You vomit blood or material that looks like coffee grounds. °· You are unable to keep fluids down. °· Your abdominal pain gets worse. °· You have a fever. °· You do not feel better after 1 week. °· You have any other questions or concerns. °MAKE SURE YOU: °· Understand these instructions. °· Will watch your condition. °· Will get help right away if you are not doing well or get worse. °Document  Released: 10/02/2001 Document Revised: 04/08/2012 Document Reviewed: 11/21/2011 °ExitCare® Patient Information ©2015 [employer]. This information is not intended to replace advice given to you by your health care provider. Make sure you discuss any questions you have with your health care provider. ° °

## 2014-09-04 NOTE — ED Notes (Signed)
Pt discharged home with all belongings, pt alert, oriented, and ambulatory upon discharge. 3 new RX prescribed, pt verbalizes understanding of discharge instructions. Pt driven home by friend

## 2014-09-04 NOTE — ED Notes (Signed)
Patient transported to CT 

## 2014-09-04 NOTE — ED Notes (Signed)
Pt presents with mid abd pain x3 weeks. Pt seen here when her symptoms started. Pt states she was diagnosed with "ulcers and ovarian cyst".

## 2015-01-19 ENCOUNTER — Emergency Department (HOSPITAL_COMMUNITY)
Admission: EM | Admit: 2015-01-19 | Discharge: 2015-01-19 | Disposition: A | Discharge status: Home / Self Care | Payer: Medicaid Other | Attending: Emergency Medicine | Admitting: Emergency Medicine

## 2015-01-19 ENCOUNTER — Encounter (HOSPITAL_COMMUNITY): Payer: Self-pay | Admitting: Emergency Medicine

## 2015-01-19 ENCOUNTER — Emergency Department (HOSPITAL_COMMUNITY): Payer: Medicaid Other

## 2015-01-19 DIAGNOSIS — Z3202 Encounter for pregnancy test, result negative: Secondary | ICD-10-CM | POA: Insufficient documentation

## 2015-01-19 DIAGNOSIS — Z862 Personal history of diseases of the blood and blood-forming organs and certain disorders involving the immune mechanism: Secondary | ICD-10-CM | POA: Diagnosis not present

## 2015-01-19 DIAGNOSIS — Z720 Tobacco use: Secondary | ICD-10-CM | POA: Insufficient documentation

## 2015-01-19 DIAGNOSIS — Z8679 Personal history of other diseases of the circulatory system: Secondary | ICD-10-CM | POA: Insufficient documentation

## 2015-01-19 DIAGNOSIS — N8320 Unspecified ovarian cysts: Secondary | ICD-10-CM | POA: Insufficient documentation

## 2015-01-19 DIAGNOSIS — R1013 Epigastric pain: Secondary | ICD-10-CM | POA: Diagnosis present

## 2015-01-19 DIAGNOSIS — K2970 Gastritis, unspecified, without bleeding: Secondary | ICD-10-CM | POA: Diagnosis not present

## 2015-01-19 DIAGNOSIS — N83202 Unspecified ovarian cyst, left side: Secondary | ICD-10-CM

## 2015-01-19 DIAGNOSIS — Z7982 Long term (current) use of aspirin: Secondary | ICD-10-CM | POA: Insufficient documentation

## 2015-01-19 DIAGNOSIS — R102 Pelvic and perineal pain: Secondary | ICD-10-CM

## 2015-01-19 LAB — CBC WITH DIFFERENTIAL/PLATELET
BASOS ABS: 0 10*3/uL (ref 0.0–0.1)
Basophils Relative: 0 % (ref 0–1)
EOS ABS: 0.1 10*3/uL (ref 0.0–0.7)
Eosinophils Relative: 1 % (ref 0–5)
HCT: 38.8 % (ref 36.0–46.0)
HEMOGLOBIN: 13 g/dL (ref 12.0–15.0)
LYMPHS PCT: 28 % (ref 12–46)
Lymphs Abs: 2.5 10*3/uL (ref 0.7–4.0)
MCH: 32.9 pg (ref 26.0–34.0)
MCHC: 33.5 g/dL (ref 30.0–36.0)
MCV: 98.2 fL (ref 78.0–100.0)
Monocytes Absolute: 0.7 10*3/uL (ref 0.1–1.0)
Monocytes Relative: 8 % (ref 3–12)
NEUTROS PCT: 63 % (ref 43–77)
Neutro Abs: 5.8 10*3/uL (ref 1.7–7.7)
PLATELETS: 262 10*3/uL (ref 150–400)
RBC: 3.95 MIL/uL (ref 3.87–5.11)
RDW: 13.7 % (ref 11.5–15.5)
WBC: 9.1 10*3/uL (ref 4.0–10.5)

## 2015-01-19 LAB — COMPREHENSIVE METABOLIC PANEL
ALBUMIN: 4.1 g/dL (ref 3.5–5.2)
ALK PHOS: 61 U/L (ref 39–117)
ALT: 16 U/L (ref 0–35)
AST: 24 U/L (ref 0–37)
Anion gap: 8 (ref 5–15)
BUN: 13 mg/dL (ref 6–23)
CALCIUM: 8.7 mg/dL (ref 8.4–10.5)
CHLORIDE: 103 mmol/L (ref 96–112)
CO2: 23 mmol/L (ref 19–32)
CREATININE: 0.57 mg/dL (ref 0.50–1.10)
GFR calc non Af Amer: >90 mL/min (ref >90)
Glucose, Bld: 84 mg/dL (ref 70–99)
Potassium: 3.8 mmol/L (ref 3.5–5.1)
SODIUM: 134 mmol/L — AB (ref 135–145)
TOTAL PROTEIN: 7.4 g/dL (ref 6.0–8.3)
Total Bilirubin: 0.6 mg/dL (ref 0.3–1.2)

## 2015-01-19 LAB — URINALYSIS, ROUTINE W REFLEX MICROSCOPIC
BILIRUBIN URINE: NEGATIVE
GLUCOSE, UA: NEGATIVE mg/dL
HGB URINE DIPSTICK: NEGATIVE
KETONES UR: NEGATIVE mg/dL
Leukocytes, UA: NEGATIVE
NITRITE: NEGATIVE
Protein, ur: NEGATIVE mg/dL
SPECIFIC GRAVITY, URINE: 1.02 (ref 1.005–1.030)
UROBILINOGEN UA: 0.2 mg/dL (ref 0.0–1.0)
pH: 5.5 (ref 5.0–8.0)

## 2015-01-19 LAB — WET PREP, GENITAL
Clue Cells Wet Prep HPF POC: NONE SEEN
TRICH WET PREP: NONE SEEN
WBC WET PREP: NONE SEEN
Yeast Wet Prep HPF POC: NONE SEEN

## 2015-01-19 LAB — I-STAT CG4 LACTIC ACID, ED: LACTIC ACID, VENOUS: 0.36 mmol/L — AB (ref 0.5–2.0)

## 2015-01-19 LAB — LIPASE, BLOOD: Lipase: 17 U/L (ref 11–59)

## 2015-01-19 LAB — POC URINE PREG, ED: Preg Test, Ur: NEGATIVE

## 2015-01-19 MED ORDER — FENTANYL CITRATE 0.05 MG/ML IJ SOLN
100.0000 ug | Freq: Once | INTRAMUSCULAR | Status: AC
  Administered 2015-01-19: 100 ug via INTRAVENOUS
  Filled 2015-01-19: qty 2

## 2015-01-19 MED ORDER — MORPHINE SULFATE 4 MG/ML IJ SOLN
4.0000 mg | Freq: Once | INTRAMUSCULAR | Status: AC
  Administered 2015-01-19: 4 mg via INTRAVENOUS
  Filled 2015-01-19: qty 1

## 2015-01-19 MED ORDER — ONDANSETRON HCL 4 MG/2ML IJ SOLN
4.0000 mg | Freq: Once | INTRAMUSCULAR | Status: AC
  Administered 2015-01-19: 4 mg via INTRAVENOUS
  Filled 2015-01-19: qty 2

## 2015-01-19 MED ORDER — SODIUM CHLORIDE 0.9 % IV BOLUS (SEPSIS)
1000.0000 mL | Freq: Once | INTRAVENOUS | Status: AC
  Administered 2015-01-19: 1000 mL via INTRAVENOUS

## 2015-01-19 MED ORDER — HYDROCODONE-ACETAMINOPHEN 5-325 MG PO TABS
1.0000 | ORAL_TABLET | Freq: Four times a day (QID) | ORAL | Status: DC | PRN
Start: 2015-01-19 — End: 2015-09-27

## 2015-01-19 NOTE — ED Provider Notes (Addendum)
CSN: [id number]     Arrival date & time 01/19/15  1732 History   First MD Initiated Contact with Patient 01/19/15 1810     Chief Complaint  Patient presents with  . Abdominal Pain  . Nausea     (Consider location/radiation/quality/duration/timing/severity/associated sxs/prior Treatment) Patient is a 39 y.o. female presenting with abdominal pain. The history is provided by the patient.  Abdominal Pain Pain location:  Epigastric, suprapubic and periumbilical Pain quality: aching, gnawing, sharp and shooting   Pain radiates to:  Does not radiate Pain severity:  Severe Onset quality:  Gradual Duration:  3 days Timing:  Constant Progression:  Worsening Chronicity:  Recurrent Context: eating   Relieved by:  Nothing Worsened by:  Eating Ineffective treatments: bc powder. Associated symptoms: anorexia and nausea   Associated symptoms: no cough, no diarrhea, no dysuria, no fever, no hematuria, no shortness of breath, no vaginal bleeding, no vaginal discharge and no vomiting   Risk factors: NSAID use   Risk factors comment:  H.pylori PUD s/p treatment and recurrent ovarian cysts   Past Medical History  Diagnosis Date  . Anemia   . Irregular heart rate   . Ovarian cyst    Past Surgical History  Procedure Laterality Date  . Appendectomy    . Breast enhancement surgery     No family history on file. History  Substance Use Topics  . Smoking status: Current Every Day Smoker -- 1.00 packs/day    Types: Cigarettes  . Smokeless tobacco: Never Used  . Alcohol Use: Yes     Comment: occassional   OB History    No data available     Review of Systems  Constitutional: Negative for fever.  Respiratory: Negative for cough and shortness of breath.   Gastrointestinal: Positive for nausea, abdominal pain and anorexia. Negative for vomiting and diarrhea.  Genitourinary: Negative for dysuria, hematuria, vaginal bleeding and vaginal discharge.  All other systems reviewed and are  negative.     Allergies  Dilaudid  Home Medications   Prior to Admission medications   Medication Sig Start Date End Date Taking? Authorizing Provider  Aspirin-Salicylamide-Caffeine (BC HEADACHE POWDER PO) Take 1 packet by mouth daily as needed (headache / pain).   Yes Historical Provider, MD  HYDROcodone-acetaminophen (NORCO) 5-325 MG per tablet Take 1 tablet by mouth every 4 (four) hours as needed for moderate pain or severe pain. Patient not taking: Reported on 01/19/2015 09/04/14   Loren Raceravid Yelverton, MD  omeprazole (PRILOSEC) 20 MG capsule Take 1 capsule (20 mg total) by mouth daily. Patient not taking: Reported on 01/19/2015 09/04/14   Loren Raceravid Yelverton, MD  sucralfate (CARAFATE) 1 G tablet Take 1 tablet (1 g total) by mouth 3 (three) times daily as needed (abdominal pain). Patient not taking: Reported on 01/19/2015 09/04/14   Loren Raceravid Yelverton, MD   BP 140/86 mmHg  Pulse 84  Temp(Src) 98.6 F (37 C) (Oral)  Resp 16  SpO2 99%  LMP 12/29/2014 Physical Exam  Constitutional: She is oriented to person, place, and time. She appears well-developed and well-nourished. She appears distressed.  HENT:  Head: Normocephalic and atraumatic.  Eyes: EOM are normal. Pupils are equal, round, and reactive to light.  Cardiovascular: Normal rate, regular rhythm, normal heart sounds and intact distal pulses.  Exam reveals no friction rub.   No murmur heard. Pulmonary/Chest: Effort normal and breath sounds normal. She has no wheezes. She has no rales.  Abdominal: Soft. Normal appearance and bowel sounds are normal. She exhibits no distension.  There is tenderness in the epigastric area, periumbilical area, suprapubic area and left lower quadrant. There is guarding. There is no rebound.  Musculoskeletal: Normal range of motion. She exhibits no tenderness.  No edema  Neurological: She is alert and oriented to person, place, and time. No cranial nerve deficit.  Skin: Skin is warm and dry. No rash noted.   Psychiatric: She has a normal mood and affect. Her behavior is normal.  Nursing note and vitals reviewed.   ED Course  Procedures (including critical care time) Labs Review Labs Reviewed  COMPREHENSIVE METABOLIC PANEL - Abnormal; Notable for the following:    Sodium 134 (*)    All other components within normal limits  URINALYSIS, ROUTINE W REFLEX MICROSCOPIC - Abnormal; Notable for the following:    APPearance HAZY (*)    All other components within normal limits  I-STAT CG4 LACTIC ACID, ED - Abnormal; Notable for the following:    Lactic Acid, Venous 0.36 (*)    All other components within normal limits  WET PREP, GENITAL  CBC WITH DIFFERENTIAL/PLATELET  LIPASE, BLOOD  POC URINE PREG, ED  GC/CHLAMYDIA PROBE AMP ([place])    Imaging Review US Transvaginal Non-ob  01/19/2015   CLINICAL DATA:  Pelvic pain for 3 days. Last menstrual period 3 weeks ago. Prior appendectomy.  EXAM: TRANSABDOMINAL AND TRANSVAGINAL ULTRASOUND OF PELVIS  TECHNIQUE: Both transabdominal and transvaginal ultrasound examinations of the pelvis were performed. Transabdominal technique was performed for global imaging of the pelvis including uterus, ovaries, adnexal regions, and pelvic cul-de-sac. It was necessary to proceed with endovaginal exam following the transabdominal exam to visualize the uterus, ovaries, and adnexa.  COMPARISON:  Abdominal pelvic CT 09/04/2014.  FINDINGS: Uterus  Measurements: 8.3 x 4.0 x 5.3 cm. No fibroids or other mass visualized.  Endometrium  Thickness: Normal, 9 mm.  Right ovary  Measurements: 2.8 x 1.3 x 1.8 cm. Normal appearance/no adnexal mass.  Left ovary  Measurements: 4.0 x 2.0 x 3.9 cm. An irregular 2.0 x 0.8 x 1.6 cm lesion is identified within. Hypoechoic.  Other findings  Trace free pelvic fluid is likely physiologic.  IMPRESSION: 1. 2.0 cm left ovarian lesion is favored to represent a hemorrhagic follicle/cyst. 2. Otherwise, normal pelvic ultrasound, without explanation for  pain.   Electronically Signed   By: Jeronimo Greaves M.D.   On: 01/19/2015 20:48   US Pelvis Complete  01/19/2015   CLINICAL DATA:  Pelvic pain for 3 days. Last menstrual period 3 weeks ago. Prior appendectomy.  EXAM: TRANSABDOMINAL AND TRANSVAGINAL ULTRASOUND OF PELVIS  TECHNIQUE: Both transabdominal and transvaginal ultrasound examinations of the pelvis were performed. Transabdominal technique was performed for global imaging of the pelvis including uterus, ovaries, adnexal regions, and pelvic cul-de-sac. It was necessary to proceed with endovaginal exam following the transabdominal exam to visualize the uterus, ovaries, and adnexa.  COMPARISON:  Abdominal pelvic CT 09/04/2014.  FINDINGS: Uterus  Measurements: 8.3 x 4.0 x 5.3 cm. No fibroids or other mass visualized.  Endometrium  Thickness: Normal, 9 mm.  Right ovary  Measurements: 2.8 x 1.3 x 1.8 cm. Normal appearance/no adnexal mass.  Left ovary  Measurements: 4.0 x 2.0 x 3.9 cm. An irregular 2.0 x 0.8 x 1.6 cm lesion is identified within. Hypoechoic.  Other findings  Trace free pelvic fluid is likely physiologic.  IMPRESSION: 1. 2.0 cm left ovarian lesion is favored to represent a hemorrhagic follicle/cyst. 2. Otherwise, normal pelvic ultrasound, without explanation for pain.   Electronically Signed  By: Jeronimo Greaves M.D.   On: 01/19/2015 20:48     EKG Interpretation None      MDM   Final diagnoses:  Pelvic pain in female  Left ovarian cyst  Gastritis   Patient with a prior history of H pylori peptic ulcer disease treated approximate 6 months ago and recurrent ovarian cyst presents today with abdominal pain for the last 3 days that's worse with eating and movement. She has also been taking BC powders. Patient has significant left pelvic tenderness but also epigastric tenderness. This time difficult to discern if patient's pain is okay. In origin versus peptic ulcer disease. She is status post appendectomy and low suspicion for cholelithiasis or  diverticulitis. No peritoneal signs. She denies any vaginal discharge and LMP was 3 weeks ago.  CBC, CMP, lipase, UA pending also do a pelvic to evaluate if patient has focal adnexal tenderness. She was given IV fluids pain and nausea medication  9:22 PM Labs wnl.  UA and wet prep wihtout findings.  U/S showing a hemorrhagic left ovarian cyst which is most likely partly the cause of her sx and also possible acute gastritis.  Pt pain improved after fentanyl and morphine.   Will d/c home with pain meds and pt will start back on her prilosec   Gwyneth Sprout, MD 01/19/15 2127  Gwyneth Sprout, MD 01/19/15 2130

## 2015-01-19 NOTE — ED Notes (Signed)
Pt c/o abd pain and nausea x couple days.  Pt states that pain gets worse when she eats. Pt states that she has PMH ovarian cyst and doesn't know if it's related.

## 2015-01-19 NOTE — ED Notes (Signed)
Questions, concerns denied. Pt ambulatory and a&ox4 upon dc 

## 2015-01-19 NOTE — ED Notes (Signed)
Patient requests that healthcare information not be discussed in front of anyone either than herself.

## 2015-01-20 LAB — GC/CHLAMYDIA PROBE AMP (~~LOC~~) NOT AT ARMC
Chlamydia: NEGATIVE
NEISSERIA GONORRHEA: NEGATIVE

## 2015-09-27 ENCOUNTER — Emergency Department (HOSPITAL_BASED_OUTPATIENT_CLINIC_OR_DEPARTMENT_OTHER): Payer: Medicaid Other

## 2015-09-27 ENCOUNTER — Emergency Department (HOSPITAL_BASED_OUTPATIENT_CLINIC_OR_DEPARTMENT_OTHER)
Admission: EM | Admit: 2015-09-27 | Discharge: 2015-09-27 | Disposition: A | Discharge status: Home / Self Care | Payer: Self-pay | Attending: Emergency Medicine | Admitting: Emergency Medicine

## 2015-09-27 ENCOUNTER — Encounter (HOSPITAL_BASED_OUTPATIENT_CLINIC_OR_DEPARTMENT_OTHER): Payer: Self-pay | Admitting: *Deleted

## 2015-09-27 ENCOUNTER — Emergency Department (HOSPITAL_BASED_OUTPATIENT_CLINIC_OR_DEPARTMENT_OTHER): Payer: Self-pay

## 2015-09-27 DIAGNOSIS — Z3202 Encounter for pregnancy test, result negative: Secondary | ICD-10-CM | POA: Insufficient documentation

## 2015-09-27 DIAGNOSIS — Z862 Personal history of diseases of the blood and blood-forming organs and certain disorders involving the immune mechanism: Secondary | ICD-10-CM | POA: Insufficient documentation

## 2015-09-27 DIAGNOSIS — R110 Nausea: Secondary | ICD-10-CM | POA: Insufficient documentation

## 2015-09-27 DIAGNOSIS — Z8742 Personal history of other diseases of the female genital tract: Secondary | ICD-10-CM | POA: Insufficient documentation

## 2015-09-27 DIAGNOSIS — Z8679 Personal history of other diseases of the circulatory system: Secondary | ICD-10-CM | POA: Insufficient documentation

## 2015-09-27 DIAGNOSIS — F17210 Nicotine dependence, cigarettes, uncomplicated: Secondary | ICD-10-CM | POA: Insufficient documentation

## 2015-09-27 DIAGNOSIS — Z79899 Other long term (current) drug therapy: Secondary | ICD-10-CM | POA: Insufficient documentation

## 2015-09-27 DIAGNOSIS — N83519 Torsion of ovary and ovarian pedicle, unspecified side: Secondary | ICD-10-CM

## 2015-09-27 DIAGNOSIS — Z9049 Acquired absence of other specified parts of digestive tract: Secondary | ICD-10-CM | POA: Insufficient documentation

## 2015-09-27 DIAGNOSIS — R1031 Right lower quadrant pain: Secondary | ICD-10-CM | POA: Insufficient documentation

## 2015-09-27 HISTORY — DX: Essential (primary) hypertension: I10

## 2015-09-27 LAB — CBC WITH DIFFERENTIAL/PLATELET
BASOS ABS: 0 10*3/uL (ref 0.0–0.1)
BASOS PCT: 0 %
Eosinophils Absolute: 0.2 10*3/uL (ref 0.0–0.7)
Eosinophils Relative: 2 %
HEMATOCRIT: 36.9 % (ref 36.0–46.0)
HEMOGLOBIN: 12.5 g/dL (ref 12.0–15.0)
LYMPHS PCT: 30 %
Lymphs Abs: 3.1 10*3/uL (ref 0.7–4.0)
MCH: 32.6 pg (ref 26.0–34.0)
MCHC: 33.9 g/dL (ref 30.0–36.0)
MCV: 96.1 fL (ref 78.0–100.0)
MONO ABS: 0.6 10*3/uL (ref 0.1–1.0)
Monocytes Relative: 6 %
NEUTROS ABS: 6.5 10*3/uL (ref 1.7–7.7)
NEUTROS PCT: 62 %
Platelets: 271 10*3/uL (ref 150–400)
RBC: 3.84 MIL/uL — AB (ref 3.87–5.11)
RDW: 12.9 % (ref 11.5–15.5)
WBC: 10.5 10*3/uL (ref 4.0–10.5)

## 2015-09-27 LAB — BASIC METABOLIC PANEL
ANION GAP: 6 (ref 5–15)
BUN: 11 mg/dL (ref 6–20)
CALCIUM: 8.8 mg/dL — AB (ref 8.9–10.3)
CO2: 22 mmol/L (ref 22–32)
Chloride: 109 mmol/L (ref 101–111)
Creatinine, Ser: 0.69 mg/dL (ref 0.44–1.00)
GLUCOSE: 105 mg/dL — AB (ref 65–99)
POTASSIUM: 3.5 mmol/L (ref 3.5–5.1)
Sodium: 137 mmol/L (ref 135–145)

## 2015-09-27 LAB — URINALYSIS, ROUTINE W REFLEX MICROSCOPIC
Bilirubin Urine: NEGATIVE
GLUCOSE, UA: NEGATIVE mg/dL
Hgb urine dipstick: NEGATIVE
Ketones, ur: NEGATIVE mg/dL
LEUKOCYTES UA: NEGATIVE
NITRITE: NEGATIVE
PH: 6 (ref 5.0–8.0)
Protein, ur: NEGATIVE mg/dL
SPECIFIC GRAVITY, URINE: 1.007 (ref 1.005–1.030)

## 2015-09-27 LAB — PREGNANCY, URINE: Preg Test, Ur: NEGATIVE

## 2015-09-27 MED ORDER — SUCRALFATE 1 G PO TABS: 1.0000 g | ORAL_TABLET | Freq: Three times a day (TID) | ORAL | Status: DC

## 2015-09-27 MED ORDER — KETOROLAC TROMETHAMINE 30 MG/ML IJ SOLN
30.0000 mg | Freq: Once | INTRAMUSCULAR | Status: AC
  Administered 2015-09-27: 30 mg via INTRAVENOUS
  Filled 2015-09-27: qty 1

## 2015-09-27 MED ORDER — IOHEXOL 300 MG/ML  SOLN
100.0000 mL | Freq: Once | INTRAMUSCULAR | Status: AC | PRN
  Administered 2015-09-27: 100 mL via INTRAVENOUS

## 2015-09-27 MED ORDER — IOHEXOL 300 MG/ML  SOLN
25.0000 mL | Freq: Once | INTRAMUSCULAR | Status: AC | PRN
  Administered 2015-09-27: 25 mL via ORAL

## 2015-09-27 MED ORDER — MORPHINE SULFATE (PF) 4 MG/ML IV SOLN
4.0000 mg | Freq: Once | INTRAVENOUS | Status: AC
  Administered 2015-09-27: 4 mg via INTRAVENOUS
  Filled 2015-09-27: qty 1

## 2015-09-27 MED ORDER — HYDROCODONE-ACETAMINOPHEN 5-325 MG PO TABS: 1.0000 | ORAL_TABLET | Freq: Four times a day (QID) | ORAL | Status: DC | PRN

## 2015-09-27 NOTE — ED Notes (Signed)
Pt amb to room 5 with quick steady gait in nad. Pt reports right lower quad pain x yesterday, states this feels like her ovarian cyst pain. Denies any n/v/d or vaginal bleeding or discharge.

## 2015-09-27 NOTE — Discharge Instructions (Signed)
Hydrocodone as prescribed as needed for pain.  Follow-up with your gynecologist in the next 1-2 weeks for recheck.  Return to the ER if symptoms acutely worsen or change.   Abdominal Pain, Adult Many things can cause abdominal pain. Usually, abdominal pain is not caused by a disease and will improve without treatment. It can often be observed and treated at home. Your health care provider will do a physical exam and possibly order blood tests and X-rays to help determine the seriousness of your pain. However, in many cases, more time must pass before a clear cause of the pain can be found. Before that point, your health care provider may not know if you need more testing or further treatment. HOME CARE INSTRUCTIONS Monitor your abdominal pain for any changes. The following actions may help to alleviate any discomfort you are experiencing:  Only take over-the-counter or prescription medicines as directed by your health care provider.  Do not take laxatives unless directed to do so by your health care provider.  Try a clear liquid diet (broth, tea, or water) as directed by your health care provider. Slowly move to a bland diet as tolerated. SEEK MEDICAL CARE IF:  You have unexplained abdominal pain.  You have abdominal pain associated with nausea or diarrhea.  You have pain when you urinate or have a bowel movement.  You experience abdominal pain that wakes you in the night.  You have abdominal pain that is worsened or improved by eating food.  You have abdominal pain that is worsened with eating fatty foods.  You have a fever. SEEK IMMEDIATE MEDICAL CARE IF:  Your pain does not go away within 2 hours.  You keep throwing up (vomiting).  Your pain is felt only in portions of the abdomen, such as the right side or the left lower portion of the abdomen.  You pass bloody or black tarry stools. MAKE SURE YOU:  Understand these instructions.  Will watch your condition.  Will  get help right away if you are not doing well or get worse.   This information is not intended to replace advice given to you by your health care provider. Make sure you discuss any questions you have with your health care provider.   Document Released: 07/18/2005 Document Revised: 06/29/2015 Document Reviewed: 06/17/2013 [employer] Interactive Patient Education [employer]

## 2015-09-27 NOTE — ED Notes (Signed)
Pt back from CT

## 2015-09-27 NOTE — ED Provider Notes (Signed)
CSN: [id number]     Arrival date & time 09/27/15  1026 History   First MD Initiated Contact with Patient 09/27/15 1126     Chief Complaint  Patient presents with  . Abdominal Pain     (Consider location/radiation/quality/duration/timing/severity/associated sxs/prior Treatment) HPI Comments: Patient is a 39 year old female with history of prior ovarian cyst. She presents for evaluation of right lower quadrant pain. This started yesterday in the absence of any injury or trauma. She denies any difficulty urinating. She denies any vaginal bleeding or discharge. She denies any fevers or chills. She has a prior history of ovarian cyst that was diagnosed in March of this year, but was never followed up. She also has had her appendix removed in the past.  Patient is a 39 y.o. female presenting with abdominal pain. The history is provided by the patient.  Abdominal Pain Pain location:  RLQ Pain quality: cramping   Pain radiates to:  Does not radiate Pain severity:  Severe Onset quality:  Sudden Duration:  24 hours Timing:  Constant Progression:  Worsening Chronicity:  Recurrent Relieved by:  Nothing Worsened by:  Nothing tried Ineffective treatments:  None tried Associated symptoms: nausea   Associated symptoms: no chills, no diarrhea and no fever     Past Medical History  Diagnosis Date  . Anemia   . Irregular heart rate   . Ovarian cyst    Past Surgical History  Procedure Laterality Date  . Appendectomy    . Breast enhancement surgery     History reviewed. No pertinent family history. Social History  Substance Use Topics  . Smoking status: Current Every Day Smoker -- 1.00 packs/day    Types: Cigarettes  . Smokeless tobacco: Never Used  . Alcohol Use: Yes     Comment: occassional   OB History    No data available     Review of Systems  Constitutional: Negative for fever and chills.  Gastrointestinal: Positive for nausea and abdominal pain. Negative for diarrhea.  All  other systems reviewed and are negative.     Allergies  Dilaudid  Home Medications   Prior to Admission medications   Medication Sig Start Date End Date Taking? Authorizing Provider  metoprolol tartrate (LOPRESSOR) 25 MG tablet Take 25 mg by mouth 2 (two) times daily.   Yes Historical Provider, MD   BP 150/91 mmHg  Pulse 86  Temp(Src) 98.4 F (36.9 C) (Oral)  Resp 16  Ht 5\' 4"  (1.626 m)  Wt 110 lb (49.896 kg)  BMI 18.87 kg/m2  SpO2 100%  LMP 09/20/2015 Physical Exam  Constitutional: She is oriented to person, place, and time. She appears well-developed and well-nourished. No distress.  HENT:  Head: Normocephalic and atraumatic.  Neck: Normal range of motion. Neck supple.  Cardiovascular: Normal rate and regular rhythm.  Exam reveals no gallop and no friction rub.   No murmur heard. Pulmonary/Chest: Effort normal and breath sounds normal. No respiratory distress. She has no wheezes.  Abdominal: Soft. Bowel sounds are normal. She exhibits no distension. There is tenderness. There is no rebound and no guarding.  There is tenderness to palpation in the right lower quadrant. There is no rebound and no guarding. There is no CVA tenderness.  Musculoskeletal: Normal range of motion.  Neurological: She is alert and oriented to person, place, and time.  Skin: Skin is warm and dry. She is not diaphoretic.  Nursing note and vitals reviewed.   ED Course  Procedures (including critical care time) Labs Review  Labs Reviewed - No data to display  Imaging Review No results found. I have personally reviewed and evaluated these images and lab results as part of my medical decision-making.   EKG Interpretation None      MDM   Final diagnoses:  None    Patient presents with right lower quadrant pain. She is status post appendectomy and has a history of ovarian cysts. Her ultrasound reveals no evidence for cyst, no evidence for torsion, and CT scan is essentially unremarkable  with the exception of findings suggestive of pelvic congestion syndrome. I highly doubt that this is the cause of her discomfort. She seems to be feeling better now and I have found nothing emergent in the workup that requires admission or consultation. She will be discharged with pain medication and advised to follow-up with her GYN doctor.    Geoffery Lyons, MD 09/27/15 [phone]

## 2015-09-27 NOTE — ED Notes (Signed)
Patient transported to Ultrasound 

## 2015-09-27 NOTE — ED Notes (Signed)
MD at bedside. 

## 2016-02-03 ENCOUNTER — Encounter (HOSPITAL_BASED_OUTPATIENT_CLINIC_OR_DEPARTMENT_OTHER): Payer: Self-pay | Admitting: Emergency Medicine

## 2016-02-03 ENCOUNTER — Emergency Department (HOSPITAL_BASED_OUTPATIENT_CLINIC_OR_DEPARTMENT_OTHER)
Admission: EM | Admit: 2016-02-03 | Discharge: 2016-02-03 | Disposition: A | Discharge status: Home / Self Care | Payer: Self-pay | Attending: Emergency Medicine | Admitting: Emergency Medicine

## 2016-02-03 ENCOUNTER — Emergency Department (HOSPITAL_BASED_OUTPATIENT_CLINIC_OR_DEPARTMENT_OTHER): Payer: Self-pay

## 2016-02-03 DIAGNOSIS — F17210 Nicotine dependence, cigarettes, uncomplicated: Secondary | ICD-10-CM | POA: Insufficient documentation

## 2016-02-03 DIAGNOSIS — R51 Headache: Secondary | ICD-10-CM | POA: Insufficient documentation

## 2016-02-03 DIAGNOSIS — Z79899 Other long term (current) drug therapy: Secondary | ICD-10-CM | POA: Insufficient documentation

## 2016-02-03 DIAGNOSIS — R079 Chest pain, unspecified: Secondary | ICD-10-CM

## 2016-02-03 DIAGNOSIS — R110 Nausea: Secondary | ICD-10-CM | POA: Insufficient documentation

## 2016-02-03 DIAGNOSIS — G8929 Other chronic pain: Secondary | ICD-10-CM

## 2016-02-03 DIAGNOSIS — I10 Essential (primary) hypertension: Secondary | ICD-10-CM | POA: Insufficient documentation

## 2016-02-03 DIAGNOSIS — R002 Palpitations: Secondary | ICD-10-CM | POA: Insufficient documentation

## 2016-02-03 LAB — CBC WITH DIFFERENTIAL/PLATELET
BASOS ABS: 0 10*3/uL (ref 0.0–0.1)
BASOS PCT: 0 %
EOS PCT: 1 %
Eosinophils Absolute: 0.1 10*3/uL (ref 0.0–0.7)
HCT: 40.7 % (ref 36.0–46.0)
Hemoglobin: 14.1 g/dL (ref 12.0–15.0)
Lymphocytes Relative: 28 %
Lymphs Abs: 2.7 10*3/uL (ref 0.7–4.0)
MCH: 33 pg (ref 26.0–34.0)
MCHC: 34.6 g/dL (ref 30.0–36.0)
MCV: 95.3 fL (ref 78.0–100.0)
MONO ABS: 0.5 10*3/uL (ref 0.1–1.0)
Monocytes Relative: 5 %
Neutro Abs: 6.3 10*3/uL (ref 1.7–7.7)
Neutrophils Relative %: 66 %
PLATELETS: 291 10*3/uL (ref 150–400)
RBC: 4.27 MIL/uL (ref 3.87–5.11)
RDW: 13.6 % (ref 11.5–15.5)
WBC: 9.7 10*3/uL (ref 4.0–10.5)

## 2016-02-03 LAB — COMPREHENSIVE METABOLIC PANEL
ALBUMIN: 4 g/dL (ref 3.5–5.0)
ALT: 15 U/L (ref 14–54)
ANION GAP: 6 (ref 5–15)
AST: 21 U/L (ref 15–41)
Alkaline Phosphatase: 47 U/L (ref 38–126)
BUN: 16 mg/dL (ref 6–20)
CHLORIDE: 108 mmol/L (ref 101–111)
CO2: 23 mmol/L (ref 22–32)
Calcium: 9.2 mg/dL (ref 8.9–10.3)
Creatinine, Ser: 0.62 mg/dL (ref 0.44–1.00)
GFR calc Af Amer: >60 mL/min (ref >60)
Glucose, Bld: 127 mg/dL — ABNORMAL HIGH (ref 65–99)
POTASSIUM: 4 mmol/L (ref 3.5–5.1)
Sodium: 137 mmol/L (ref 135–145)
Total Bilirubin: 0.5 mg/dL (ref 0.3–1.2)
Total Protein: 6.8 g/dL (ref 6.5–8.1)

## 2016-02-03 LAB — CBG MONITORING, ED: Glucose-Capillary: 110 mg/dL — ABNORMAL HIGH (ref 65–99)

## 2016-02-03 LAB — TROPONIN I

## 2016-02-03 LAB — D-DIMER, QUANTITATIVE (NOT AT ARMC)

## 2016-02-03 MED ORDER — SODIUM CHLORIDE 0.9 % IV BOLUS (SEPSIS)
1000.0000 mL | Freq: Once | INTRAVENOUS | Status: AC
  Administered 2016-02-03: 1000 mL via INTRAVENOUS

## 2016-02-03 MED ORDER — PROCHLORPERAZINE EDISYLATE 5 MG/ML IJ SOLN
10.0000 mg | Freq: Once | INTRAMUSCULAR | Status: AC
  Administered 2016-02-03: 10 mg via INTRAVENOUS
  Filled 2016-02-03: qty 2

## 2016-02-03 MED ORDER — DIPHENHYDRAMINE HCL 50 MG/ML IJ SOLN
25.0000 mg | Freq: Once | INTRAMUSCULAR | Status: AC
  Administered 2016-02-03: 25 mg via INTRAVENOUS
  Filled 2016-02-03: qty 1

## 2016-02-03 MED ORDER — RANITIDINE HCL 150 MG PO CAPS: 150.0000 mg | ORAL_CAPSULE | Freq: Every day | ORAL | Status: DC

## 2016-02-03 NOTE — ED Notes (Signed)
Pt reports tightness in chest x10 years with sob, lightheadness and dizziness.  Pt denies unilateral weakness. Pain is in mid chest with no radiation.  Pt alert and oriented.

## 2016-02-03 NOTE — ED Provider Notes (Signed)
CSN: [id number]     Arrival date & time 02/03/16  1443 History   First MD Initiated Contact with Patient 02/03/16 1501     Chief Complaint  Patient presents with  . Chest Pain     (Consider location/radiation/quality/duration/timing/severity/associated sxs/prior Treatment) HPI Comments: Chest pain for 10 years, over the last year has been every day for one year Getting worse to the point of coming in today, to point of "feeling disorientated" waking up at night with palpitations Tightness right now started this AM (happens every AM), 4/10 Now having headache since this AM If heart palpitations are bad, then gets headache Headache frontal, to ears, pounding, tunnel hearing, have to pop ears HA more severe today than usual Smoking cigarettes Daily headaches  Htn, anemia, fibromyalgia No fam hx of early heart disease    Patient is a 40 y.o. female presenting with chest pain.  Chest Pain Associated symptoms: headache, nausea and shortness of breath   Associated symptoms: no abdominal pain, no back pain, no cough (over the last 4 days, now better), no dizziness, no fever, no numbness, not vomiting and no weakness     Past Medical History  Diagnosis Date  . Anemia   . Irregular heart rate   . Ovarian cyst   . Hypertension    Past Surgical History  Procedure Laterality Date  . Appendectomy    . Breast enhancement surgery     History reviewed. No pertinent family history. Social History  Substance Use Topics  . Smoking status: Current Every Day Smoker -- 1.00 packs/day    Types: Cigarettes  . Smokeless tobacco: Never Used  . Alcohol Use: Yes     Comment: occassional   OB History    No data available     Review of Systems  Constitutional: Negative for fever.  HENT: Negative for congestion (did over last 4 days) and sore throat.   Eyes: Negative for visual disturbance.  Respiratory: Positive for shortness of breath. Negative for cough (over the last 4 days, now  better) and wheezing.   Cardiovascular: Positive for chest pain. Negative for leg swelling.  Gastrointestinal: Positive for nausea. Negative for vomiting, abdominal pain, diarrhea and constipation.  Genitourinary: Negative for difficulty urinating.  Musculoskeletal: Negative for back pain and neck pain.  Skin: Negative for rash.  Neurological: Positive for light-headedness and headaches. Negative for dizziness, syncope, weakness and numbness.      Allergies  Dilaudid  Home Medications   Prior to Admission medications   Medication Sig Start Date End Date Taking? Authorizing Provider  HYDROcodone-acetaminophen (NORCO) 5-325 MG tablet Take 1-2 tablets by mouth every 6 (six) hours as needed. 09/27/15   Geoffery Lyons, MD  metoprolol tartrate (LOPRESSOR) 25 MG tablet Take 25 mg by mouth 2 (two) times daily.    Historical Provider, MD  ranitidine (ZANTAC) 150 MG capsule Take 1 capsule (150 mg total) by mouth daily. 02/03/16   Alvira Monday, MD  sucralfate (CARAFATE) 1 G tablet Take 1 tablet (1 g total) by mouth 4 (four) times daily -  with meals and at bedtime. 09/27/15   Geoffery Lyons, MD   BP 110/80 mmHg  Pulse 76  Temp(Src) 98.4 F (36.9 C) (Oral)  Resp 19  Ht  (1.626 m)  Wt 110 lb (49.896 kg)  BMI 18.87 kg/m2  SpO2 97% Physical Exam  Constitutional: She is oriented to person, place, and time. She appears well-developed and well-nourished. No distress.  HENT:  Head: Normocephalic and atraumatic.  Eyes: Conjunctivae and EOM are normal.  Neck: Normal range of motion.  Cardiovascular: Normal rate, regular rhythm, normal heart sounds and intact distal pulses.  Exam reveals no gallop and no friction rub.   No murmur heard. Pulmonary/Chest: Effort normal and breath sounds normal. No respiratory distress. She has no wheezes. She has no rales.  Abdominal: Soft. She exhibits no distension. There is no tenderness. There is no guarding.  Musculoskeletal: She exhibits no edema or  tenderness.  Neurological: She is alert and oriented to person, place, and time. She has normal strength. No cranial nerve deficit or sensory deficit. She displays a negative Romberg sign. Coordination and gait normal. GCS eye subscore is 4. GCS verbal subscore is 5. GCS motor subscore is 6.  Skin: Skin is warm and dry. No rash noted. She is not diaphoretic. No erythema.  Nursing note and vitals reviewed.   ED Course  Procedures (including critical care time) Labs Review Labs Reviewed  COMPREHENSIVE METABOLIC PANEL - Abnormal; Notable for the following:    Glucose, Bld 127 (*)    All other components within normal limits  CBG MONITORING, ED - Abnormal; Notable for the following:    Glucose-Capillary 110 (*)    All other components within normal limits  CBC WITH DIFFERENTIAL/PLATELET  TROPONIN I  D-DIMER, QUANTITATIVE (NOT AT [facility])    Imaging Review Dg Chest 2 View  02/03/2016  CLINICAL DATA:  Chest pain, palpitations, heart racing, symptoms for years, history hypertension and smoking EXAM: CHEST  2 VIEW COMPARISON:  11/07/2009 FINDINGS: Artifacts from EKG leads including RIGHT suprahilar. Normal heart size, mediastinal contours and pulmonary vascularity. Lungs clear. No pleural effusion or pneumothorax. Osseous structures unremarkable. BILATERAL breast prostheses noted. IMPRESSION: No acute abnormalities. Electronically Signed   By: Ulyses Southward M.D.   On: 02/03/2016 15:34   Ct Head Wo Contrast  02/03/2016  CLINICAL DATA:  Headaches, dizziness, onset of symptoms today, smoker EXAM: CT HEAD WITHOUT CONTRAST TECHNIQUE: Contiguous axial images were obtained from the base of the skull through the vertex without intravenous contrast. COMPARISON:  11/06/2009 FINDINGS: Significant beam hardening artifacts at skullbase from patient's ear rings, obscuring portions of posterior fossa. Normal ventricular morphology. No midline shift or mass effect. Otherwise normal appearance of brain parenchyma. No  intracranial hemorrhage, mass lesion cyst, or evidence acute infarction. No extra-axial fluid collections. Bones and sinuses unremarkable. IMPRESSION: No definite acute intracranial abnormalities identified on exam limited by artifacts at the posterior fossa as above. Electronically Signed   By: Ulyses Southward M.D.   On: 02/03/2016 15:37   I have personally reviewed and evaluated these images and lab results as part of my medical decision-making.   EKG Interpretation   Date/Time:  Friday February 03 2016 14:59:38 EDT Ventricular Rate:  99 PR Interval:  147 QRS Duration: 73 QT Interval:  342 QTC Calculation: 439 R Axis:   85 Text Interpretation:  Sinus rhythm Consider left ventricular hypertrophy  ED PHYSICIAN INTERPRETATION AVAILABLE IN [facility] Confirmed by  TEST, Record ([id number]) on 02/04/2016 9:09:14 AM      MDM   Final diagnoses:  Chest pain, unspecified chest pain type  Chronic nonintractable headache, unspecified headache type  Palpitations   40 year old female with history of anemia, palpitations, hypertension, smoking, fibromyalgia presents with concern for worsening of chest pain that has been present for 10 years, as well as daily headaches.  Given duration of symptoms, overall have low suspicion for ACS, pulmonary embolus, aortic dissection, pneumothorax.  EKG was done  and evaluated by me showed a normal sinus rhythm without acute ST changes. Patient does have sinus tachycardia, receives birth-control shot, and given this with worsening chest pain, d-dimer was sent which was negative. Troponin was negative.  Given duration of symptoms constantly since waking doubt ACS. Pt low risk HEAR score. CBC was within normal limits. Given patient describes daily headaches, a head CT was performed which was negative.  Neurologic exam WNL. Recommend close PCP follow up for symptoms, possible holter monitor for palpitations and smoking cessation. Patient discharged in stable condition with  understanding of reasons to return.   Alvira MondayErin Franciscojavier Wronski, MD 02/04/16 1430

## 2018-02-19 ENCOUNTER — Emergency Department (HOSPITAL_BASED_OUTPATIENT_CLINIC_OR_DEPARTMENT_OTHER): Payer: Self-pay

## 2018-02-19 ENCOUNTER — Emergency Department (HOSPITAL_BASED_OUTPATIENT_CLINIC_OR_DEPARTMENT_OTHER)
Admission: EM | Admit: 2018-02-19 | Discharge: 2018-02-20 | Disposition: A | Discharge status: Home / Self Care | Payer: Self-pay | Attending: Emergency Medicine | Admitting: Emergency Medicine

## 2018-02-19 ENCOUNTER — Encounter (HOSPITAL_BASED_OUTPATIENT_CLINIC_OR_DEPARTMENT_OTHER): Payer: Self-pay

## 2018-02-19 ENCOUNTER — Other Ambulatory Visit: Payer: Self-pay

## 2018-02-19 DIAGNOSIS — W5501XA Bitten by cat, initial encounter: Secondary | ICD-10-CM | POA: Insufficient documentation

## 2018-02-19 DIAGNOSIS — F17210 Nicotine dependence, cigarettes, uncomplicated: Secondary | ICD-10-CM | POA: Insufficient documentation

## 2018-02-19 DIAGNOSIS — Y939 Activity, unspecified: Secondary | ICD-10-CM | POA: Insufficient documentation

## 2018-02-19 DIAGNOSIS — I10 Essential (primary) hypertension: Secondary | ICD-10-CM | POA: Insufficient documentation

## 2018-02-19 DIAGNOSIS — S61258A Open bite of other finger without damage to nail, initial encounter: Secondary | ICD-10-CM

## 2018-02-19 DIAGNOSIS — Z79899 Other long term (current) drug therapy: Secondary | ICD-10-CM | POA: Insufficient documentation

## 2018-02-19 DIAGNOSIS — L089 Local infection of the skin and subcutaneous tissue, unspecified: Secondary | ICD-10-CM | POA: Insufficient documentation

## 2018-02-19 DIAGNOSIS — Z23 Encounter for immunization: Secondary | ICD-10-CM | POA: Insufficient documentation

## 2018-02-19 DIAGNOSIS — Y929 Unspecified place or not applicable: Secondary | ICD-10-CM | POA: Insufficient documentation

## 2018-02-19 DIAGNOSIS — S61251A Open bite of left index finger without damage to nail, initial encounter: Secondary | ICD-10-CM | POA: Insufficient documentation

## 2018-02-19 DIAGNOSIS — Y999 Unspecified external cause status: Secondary | ICD-10-CM | POA: Insufficient documentation

## 2018-02-19 MED ORDER — ONDANSETRON HCL 4 MG/2ML IJ SOLN
4.0000 mg | Freq: Once | INTRAMUSCULAR | Status: AC
  Administered 2018-02-19: 4 mg via INTRAVENOUS
  Filled 2018-02-19: qty 2

## 2018-02-19 MED ORDER — SODIUM CHLORIDE 0.9 % IV SOLN
3.0000 g | Freq: Once | INTRAVENOUS | Status: AC
  Administered 2018-02-19: 3 g via INTRAVENOUS
  Filled 2018-02-19: qty 3

## 2018-02-19 MED ORDER — FENTANYL CITRATE (PF) 100 MCG/2ML IJ SOLN
100.0000 ug | Freq: Once | INTRAMUSCULAR | Status: AC
  Administered 2018-02-19: 100 ug via INTRAVENOUS
  Filled 2018-02-19: qty 2

## 2018-02-19 MED ORDER — TETANUS-DIPHTH-ACELL PERTUSSIS 5-2.5-18.5 LF-MCG/0.5 IM SUSP
0.5000 mL | Freq: Once | INTRAMUSCULAR | Status: AC
  Administered 2018-02-19: 0.5 mL via INTRAMUSCULAR
  Filled 2018-02-19: qty 0.5

## 2018-02-19 NOTE — ED Triage Notes (Signed)
Pt c/o cat bite to left index finger

## 2018-02-19 NOTE — ED Provider Notes (Signed)
[facility] Provider Note: Lowella Dell, MD, FACEP  CSN: [id number] MRN: [id number] ARRIVAL: 02/19/18 at 2202 ROOM: MH09/MH09   CHIEF COMPLAINT  Animal Bite   HISTORY OF PRESENT ILLNESS  02/19/18 11:17 PM Melanie Moreno is a 42 y.o. female who was bitten on the left index finger this afternoon about 3 PM.  The cat is a feral cat that has been around her house for about 2 years.  The cat should be able to be examined and detained by animal control.  It is unknown if it is had immunizations.  Since being bitten the patient has developed severe pain and mild swelling of the proximal phalanx of that finger.  Pain is worse with palpation or attempted movement and range of motion is limited.  There is no numbness distal to the wound and there is no associated erythema.  She denies systemic symptoms such as fever or chills.  She has had some nausea which she attributes to the pain.  Consultation with the [facility] state controlled substances database reveals the patient has received no opioid prescriptions in the past year.   Past Medical History:  Diagnosis Date  . Anemia   . Hypertension   . Irregular heart rate   . Ovarian cyst     Past Surgical History:  Procedure Laterality Date  . APPENDECTOMY    . BREAST ENHANCEMENT SURGERY      History reviewed. No pertinent family history.  Social History   Tobacco Use  . Smoking status: Current Every Day Smoker    Packs/day: 1.00    Types: Cigarettes  . Smokeless tobacco: Never Used  Substance Use Topics  . Alcohol use: Yes    Comment: occassional  . Drug use: Not on file    Prior to Admission medications   Medication Sig Start Date End Date Taking? Authorizing Provider  HYDROcodone-acetaminophen (NORCO) 5-325 MG tablet Take 1-2 tablets by mouth every 6 (six) hours as needed. 09/27/15   Geoffery Lyons, MD  metoprolol tartrate (LOPRESSOR) 25 MG tablet Take 25 mg by mouth 2 (two) times daily.    [provider]  ranitidine (ZANTAC) 150 MG capsule Take 1 capsule (150 mg total) by mouth daily. 02/03/16   Alvira Monday, MD  sucralfate (CARAFATE) 1 G tablet Take 1 tablet (1 g total) by mouth 4 (four) times daily -  with meals and at bedtime. 09/27/15   Geoffery Lyons, MD    Allergies Dilaudid [hydromorphone hcl]   REVIEW OF SYSTEMS  Negative except as noted here or in the History of Present Illness.   PHYSICAL EXAMINATION  Initial Vital Signs Blood pressure (!) 183/106, pulse 82, temperature 98.4 F (36.9 C), resp. rate 18, height 5' 4.5" (1.638 m), weight 54.4 kg (120 lb), SpO2 99 %.  Examination General: Well-developed, well-nourished female in no acute distress; appearance consistent with age of record HENT: normocephalic; atraumatic Eyes: pupils equal, round and reactive to light; extraocular muscles intact Neck: supple Heart: regular rate and rhythm Lungs: clear to auscultation bilaterally Abdomen: soft; nondistended; nontender; bowel sounds present Extremities: No deformity; full range of motion; puncture wound with mild swelling and severe tenderness of proximal phalanx of left index finger, finger distally neurovascularly intact Neurologic: Awake, alert and oriented; motor function intact in all extremities and symmetric; no facial droop Skin: Warm and dry Psychiatric: Tearful   RESULTS  Summary of this visit's results, reviewed by myself:   EKG Interpretation  Date/Time:    Ventricular Rate:  PR Interval:    QRS Duration:   QT Interval:    QTC Calculation:   R Axis:     Text Interpretation:        Laboratory Studies: No results found for this or any previous visit (from the past 24 hour(s)). Imaging Studies: Dg Finger Index Left  Result Date: 02/19/2018 CLINICAL DATA:  Cat bite to the posterior aspect of the proximal phalanx of the left index finger. EXAM: LEFT INDEX FINGER 2+V COMPARISON:  None. FINDINGS: No fracture bone destruction of the left index finger.  Joint spaces are maintained. Suspected puncture wound from cat bite involving the dorsum of the left index finger at the mid proximal phalangeal level. IMPRESSION: Soft tissue swelling of the left index finger with soft tissue puncture wound proximally. No acute osseous abnormality. Electronically Signed   By: Tollie Eth M.D.   On: 02/19/2018 23:46    ED COURSE and MDM  Nursing notes and initial vitals signs, including pulse oximetry, reviewed.  Vitals:   02/19/18 2207 02/19/18 2208 02/19/18 2234  BP: (!) 183/106    Pulse: 82    Resp: 18    Temp: 98.4 F (36.9 C)    SpO2: 99%    Weight:  54.4 kg (120 lb) 54.4 kg (120 lb)  Height:  5' 4.5" (1.638 m)    11:34 PM Unasyn 3 g ordered for infected cat bite.  Tetanus updated.   12:45 AM Patient advised that this is a high risk bite and we would like her to return in 12 hours for wound recheck as cat bite infection can progress very rapidly.  She was given Unasyn as noted above.  We will discharge her on Augmentin.  She was advised to contact Animal Control to have the cat evaluated for rabies.  PROCEDURES    ED DIAGNOSES     ICD-10-CM   1. Infected cat bite of index finger, initial encounter S61.258A    L08.9    W55.01XA        Mazin Emma, Jonny Ruiz, MD 02/20/18 [phone]

## 2018-02-20 MED ORDER — AMOXICILLIN-POT CLAVULANATE 875-125 MG PO TABS: 1.0000 | ORAL_TABLET | Freq: Two times a day (BID) | ORAL | 0 refills | Status: DC

## 2018-02-20 MED ORDER — FENTANYL CITRATE (PF) 100 MCG/2ML IJ SOLN
100.0000 ug | Freq: Once | INTRAMUSCULAR | Status: AC
  Administered 2018-02-20: 100 ug via INTRAVENOUS
  Filled 2018-02-20: qty 2

## 2018-02-20 MED ORDER — OXYCODONE-ACETAMINOPHEN 5-325 MG PO TABS: 1.0000 | ORAL_TABLET | ORAL | 0 refills | Status: DC | PRN

## 2018-05-17 ENCOUNTER — Encounter (HOSPITAL_BASED_OUTPATIENT_CLINIC_OR_DEPARTMENT_OTHER): Payer: Self-pay | Admitting: Emergency Medicine

## 2018-05-17 ENCOUNTER — Other Ambulatory Visit: Payer: Self-pay

## 2018-05-17 ENCOUNTER — Emergency Department (HOSPITAL_BASED_OUTPATIENT_CLINIC_OR_DEPARTMENT_OTHER): Payer: Self-pay

## 2018-05-17 ENCOUNTER — Emergency Department (HOSPITAL_BASED_OUTPATIENT_CLINIC_OR_DEPARTMENT_OTHER)
Admission: EM | Admit: 2018-05-17 | Discharge: 2018-05-17 | Disposition: A | Discharge status: Home / Self Care | Payer: Self-pay | Attending: Emergency Medicine | Admitting: Emergency Medicine

## 2018-05-17 DIAGNOSIS — F17210 Nicotine dependence, cigarettes, uncomplicated: Secondary | ICD-10-CM | POA: Insufficient documentation

## 2018-05-17 DIAGNOSIS — R1010 Upper abdominal pain, unspecified: Secondary | ICD-10-CM

## 2018-05-17 DIAGNOSIS — Z79899 Other long term (current) drug therapy: Secondary | ICD-10-CM | POA: Insufficient documentation

## 2018-05-17 DIAGNOSIS — I10 Essential (primary) hypertension: Secondary | ICD-10-CM | POA: Insufficient documentation

## 2018-05-17 DIAGNOSIS — K253 Acute gastric ulcer without hemorrhage or perforation: Secondary | ICD-10-CM | POA: Insufficient documentation

## 2018-05-17 LAB — COMPREHENSIVE METABOLIC PANEL
ALT: 21 U/L (ref 0–44)
AST: 23 U/L (ref 15–41)
Albumin: 3.8 g/dL (ref 3.5–5.0)
Alkaline Phosphatase: 48 U/L (ref 38–126)
Anion gap: 9 (ref 5–15)
BUN: 9 mg/dL (ref 6–20)
CHLORIDE: 107 mmol/L (ref 98–111)
CO2: 23 mmol/L (ref 22–32)
Calcium: 9.1 mg/dL (ref 8.9–10.3)
Creatinine, Ser: 0.66 mg/dL (ref 0.44–1.00)
Glucose, Bld: 106 mg/dL — ABNORMAL HIGH (ref 70–99)
Potassium: 4.3 mmol/L (ref 3.5–5.1)
Sodium: 139 mmol/L (ref 135–145)
Total Bilirubin: 0.5 mg/dL (ref 0.3–1.2)
Total Protein: 6.9 g/dL (ref 6.5–8.1)

## 2018-05-17 LAB — URINALYSIS, ROUTINE W REFLEX MICROSCOPIC
Bilirubin Urine: NEGATIVE
Glucose, UA: NEGATIVE mg/dL
Hgb urine dipstick: NEGATIVE
KETONES UR: NEGATIVE mg/dL
LEUKOCYTES UA: NEGATIVE
NITRITE: NEGATIVE
PH: 6 (ref 5.0–8.0)
Protein, ur: NEGATIVE mg/dL
Specific Gravity, Urine: 1.02 (ref 1.005–1.030)

## 2018-05-17 LAB — CBC
HEMATOCRIT: 38.1 % (ref 36.0–46.0)
Hemoglobin: 13.5 g/dL (ref 12.0–15.0)
MCH: 36 pg — ABNORMAL HIGH (ref 26.0–34.0)
MCHC: 35.4 g/dL (ref 30.0–36.0)
MCV: 101.6 fL — AB (ref 78.0–100.0)
Platelets: 229 10*3/uL (ref 150–400)
RBC: 3.75 MIL/uL — ABNORMAL LOW (ref 3.87–5.11)
RDW: 13.3 % (ref 11.5–15.5)
WBC: 9 10*3/uL (ref 4.0–10.5)

## 2018-05-17 LAB — PREGNANCY, URINE: Preg Test, Ur: NEGATIVE

## 2018-05-17 LAB — LIPASE, BLOOD: Lipase: 31 U/L (ref 11–51)

## 2018-05-17 MED ORDER — PANTOPRAZOLE SODIUM 40 MG PO TBEC: 40.0000 mg | DELAYED_RELEASE_TABLET | Freq: Every day | ORAL | 1 refills | Status: DC

## 2018-05-17 MED ORDER — FAMOTIDINE 20 MG PO TABS: 20.0000 mg | ORAL_TABLET | Freq: Two times a day (BID) | ORAL | 1 refills | Status: DC

## 2018-05-17 MED ORDER — GI COCKTAIL ~~LOC~~
30.0000 mL | Freq: Once | ORAL | Status: AC
  Administered 2018-05-17: 30 mL via ORAL
  Filled 2018-05-17: qty 30

## 2018-05-17 MED ORDER — HYDROCODONE-ACETAMINOPHEN 5-325 MG PO TABS: 1.0000 | ORAL_TABLET | ORAL | 0 refills | Status: DC | PRN

## 2018-05-17 MED ORDER — PANTOPRAZOLE SODIUM 40 MG PO TBEC
40.0000 mg | DELAYED_RELEASE_TABLET | Freq: Once | ORAL | Status: AC
  Administered 2018-05-17: 40 mg via ORAL
  Filled 2018-05-17: qty 1

## 2018-05-17 MED ORDER — MORPHINE SULFATE (PF) 4 MG/ML IV SOLN
6.0000 mg | Freq: Once | INTRAVENOUS | Status: AC
  Administered 2018-05-17: 6 mg via INTRAVENOUS
  Filled 2018-05-17: qty 2

## 2018-05-17 MED ORDER — HYDROCODONE-ACETAMINOPHEN 5-325 MG PO TABS
2.0000 | ORAL_TABLET | Freq: Once | ORAL | Status: AC
  Administered 2018-05-17: 2 via ORAL
  Filled 2018-05-17: qty 2

## 2018-05-17 MED ORDER — FENTANYL CITRATE (PF) 100 MCG/2ML IJ SOLN
100.0000 ug | Freq: Once | INTRAMUSCULAR | Status: AC
  Administered 2018-05-17: 100 ug via INTRAVENOUS
  Filled 2018-05-17: qty 2

## 2018-05-17 MED ORDER — IOPAMIDOL (ISOVUE-300) INJECTION 61%
100.0000 mL | Freq: Once | INTRAVENOUS | Status: AC | PRN
  Administered 2018-05-17: 100 mL via INTRAVENOUS

## 2018-05-17 MED ORDER — FAMOTIDINE 20 MG PO TABS
20.0000 mg | ORAL_TABLET | Freq: Once | ORAL | Status: AC
Start: 2018-05-17 — End: 2018-05-17
  Administered 2018-05-17: 20 mg via ORAL
  Filled 2018-05-17: qty 1

## 2018-05-17 NOTE — ED Provider Notes (Signed)
[facility] EMERGENCY DEPARTMENT Provider Note   CSN: [id number] Arrival date & time: 05/17/18  0740     History   Chief Complaint Chief Complaint  Patient presents with  . Abdominal Pain    HPI Melanie Moreno is a 42 y.o. female.  HPI  42 year old female presents with abdominal pain.  It is periumbilical across her entire abdomen.  It has been on and off for a month.  There is always some degree of pain but it seems to come and go with the intensity.  Since yesterday she is been having severe pain.  She does notice that immediately after eating it hurts worse.  She also has been having more acid reflux which she describes as a burning going up her chest.  There is no vomiting or diarrhea.  She is been trying cranberry juice that she read this might be helpful for ulcers.  She is also tried magnesium citrate which just made her go the bathroom.  No urinary or vaginal symptoms.  She has a history of ovarian cyst but states this feels much different.  She also had kidney stones but this also does not feel similar.  There is no back pain.  Pain is severe.  Past Medical History:  Diagnosis Date  . Anemia   . Hypertension   . Irregular heart rate   . Ovarian cyst     There are no active problems to display for this patient.   Past Surgical History:  Procedure Laterality Date  . APPENDECTOMY    . BREAST ENHANCEMENT SURGERY       OB History   None      Home Medications    Prior to Admission medications   Medication Sig Start Date End Date Taking? Authorizing Provider  amoxicillin-clavulanate (AUGMENTIN) 875-125 MG tablet Take 1 tablet by mouth 2 (two) times daily. One po bid x 7 days 02/20/18   Molpus, John, MD  famotidine (PEPCID) 20 MG tablet Take 1 tablet (20 mg total) by mouth 2 (two) times daily. 05/17/18   Pricilla Loveless, MD  HYDROcodone-acetaminophen (NORCO) 5-325 MG tablet Take 1-2 tablets by mouth every 4 (four) hours as needed for severe pain. 05/17/18    Pricilla Loveless, MD  metoprolol tartrate (LOPRESSOR) 25 MG tablet Take 25 mg by mouth 2 (two) times daily.    [provider]  oxyCODONE-acetaminophen (PERCOCET) 5-325 MG tablet Take 1 tablet by mouth every 4 (four) hours as needed for severe pain. 02/20/18   Molpus, John, MD  pantoprazole (PROTONIX) 40 MG tablet Take 1 tablet (40 mg total) by mouth daily. 05/17/18   Pricilla Loveless, MD    Family History History reviewed. No pertinent family history.  Social History Social History   Tobacco Use  . Smoking status: Current Every Day Smoker    Packs/day: 1.00    Types: Cigarettes  . Smokeless tobacco: Never Used  Substance Use Topics  . Alcohol use: Yes    Comment: occassional  . Drug use: Not on file     Allergies   Dilaudid [hydromorphone hcl]   Review of Systems Review of Systems  Constitutional: Negative for fever.  Gastrointestinal: Positive for abdominal pain. Negative for diarrhea and vomiting.  Genitourinary: Negative for dysuria.  Musculoskeletal: Negative for back pain.  All other systems reviewed and are negative.    Physical Exam Updated Vital Signs BP 120/87 (BP Location: Left Arm)   Pulse 87   Temp 98.5 F (36.9 C) (Oral)   Resp  18   Ht 5\' 4"  (1.626 m)   Wt 53.5 kg (118 lb)   SpO2 100%   BMI 20.25 kg/m   Physical Exam  Constitutional: She is oriented to person, place, and time. She appears well-developed and well-nourished.  Non-toxic appearance. She does not appear ill. She appears distressed (in pain).  HENT:  Head: Normocephalic and atraumatic.  Right Ear: External ear normal.  Left Ear: External ear normal.  Nose: Nose normal.  Eyes: Right eye exhibits no discharge. Left eye exhibits no discharge.  Cardiovascular: Normal rate, regular rhythm and normal heart sounds.  Pulmonary/Chest: Effort normal and breath sounds normal.  Abdominal: Soft. There is tenderness in the epigastric area and periumbilical area. There is no CVA tenderness.      Neurological: She is alert and oriented to person, place, and time.  Skin: Skin is warm and dry.  Nursing note and vitals reviewed.    ED Treatments / Results  Labs (all labs ordered are listed, but only abnormal results are displayed) Labs Reviewed  COMPREHENSIVE METABOLIC PANEL - Abnormal; Notable for the following components:      Result Value   Glucose, Bld 106 (*)    All other components within normal limits  CBC - Abnormal; Notable for the following components:   RBC 3.75 (*)    MCV 101.6 (*)    MCH 36.0 (*)    All other components within normal limits  URINALYSIS, ROUTINE W REFLEX MICROSCOPIC - Abnormal; Notable for the following components:   APPearance CLOUDY (*)    All other components within normal limits  LIPASE, BLOOD  PREGNANCY, URINE    EKG None  Radiology Ct Abdomen Pelvis W Contrast  Result Date: 05/17/2018 CLINICAL DATA:  Generalized abdominal pain. Right lower quadrant pain for 1 month. Personal history of ovarian cysts. Status post appendectomy. EXAM: CT ABDOMEN AND PELVIS WITH CONTRAST TECHNIQUE: Multidetector CT imaging of the abdomen and pelvis was performed using the standard protocol following bolus administration of intravenous contrast. CONTRAST:  ISOVUE-300 IOPAMIDOL (ISOVUE-300) INJECTION 61% COMPARISON:  Abdominal ultrasound 05/17/2018. CT abdomen pelvis 09/27/2015 FINDINGS: Lower chest: The lung bases are clear without focal nodule, mass, or airspace disease. Heart size is normal. No significant pleural or pericardial effusion is present. Hepatobiliary: No focal liver abnormality is seen. No gallstones, gallbladder wall thickening, or biliary dilatation. Pancreas: Unremarkable. No pancreatic ductal dilatation or surrounding inflammatory changes. Spleen: Normal in size without focal abnormality. Adrenals/Urinary Tract: Adrenal glands are normal bilaterally. Kidneys and ureters are within normal limits. There is no stone or mass lesion. No  hydronephrosis is present. The urinary bladder is within normal limits. Stomach/Bowel: There is moderate diffuse thickening in the distal stomach. Gastric ulcer is suspected. Duodenum is within normal limits. Small bowel is unremarkable. Terminal ileum is normal. Appendectomy is noted. The ascending and transverse colon are within normal limits. Descending and sigmoid colon are normal. Vascular/Lymphatic: No significant vascular findings are present. No enlarged abdominal or pelvic lymph nodes. Reproductive: Uterus and bilateral adnexa are unremarkable. Other: No abdominal wall hernia or abnormality. No abdominopelvic ascites. Musculoskeletal: Vertebral body heights and alignment are normal. Bony pelvis is unremarkable. The hips are located and within normal limits. IMPRESSION: 1. Marked distal gastric wall thickening and possible ulcer. There is no perforation or abscess. No discrete mass lesion is present. Endoscopy may be useful for further evaluation. 2. No other acute or focal lesion to explain the patient's abdominal pain. 3. Appendectomy. 4. Uterus and adnexa are within normal  limits for age. Electronically Signed   By: Marin Robertshristopher  Mattern M.D.   On: 05/17/2018 10:46   [place] Abdomen Limited Ruq  Result Date: 05/17/2018 CLINICAL DATA:  42 year old female with a history of periumbilical pain. EXAM: ULTRASOUND ABDOMEN LIMITED RIGHT UPPER QUADRANT COMPARISON:  None. FINDINGS: Gallbladder: No gallstones or wall thickening visualized. No sonographic Murphy sign noted by sonographer. Common bile duct: Diameter: 3 mm Liver: No focal lesion identified. Within normal limits in parenchymal echogenicity. Portal vein is patent on color Doppler imaging with normal direction of blood flow towards the liver. IMPRESSION: Unremarkable sonographic survey of the upper abdomen, with no sonographic evidence of acute cholecystitis or cholelithiasis Electronically Signed   By: Gilmer MorJaime  Wagner D.O.   On: 05/17/2018 09:39     Procedures Procedures (including critical care time)  Medications Ordered in ED Medications  fentaNYL (SUBLIMAZE) injection 100 mcg (100 mcg Intravenous Given 05/17/18 0824)  gi cocktail (Maalox,Lidocaine,Donnatal) (30 mLs Oral Given 05/17/18 0939)  morphine 4 MG/ML injection 6 mg (6 mg Intravenous Given 05/17/18 1020)  iopamidol (ISOVUE-300) 61 % injection 100 mL (100 mLs Intravenous Contrast Given 05/17/18 1027)  HYDROcodone-acetaminophen (NORCO/VICODIN) 5-325 MG per tablet 2 tablet (2 tablets Oral Given 05/17/18 1141)  pantoprazole (PROTONIX) EC tablet 40 mg (40 mg Oral Given 05/17/18 1141)  famotidine (PEPCID) tablet 20 mg (20 mg Oral Given 05/17/18 1141)     Initial Impression / Assessment and Plan / ED Course  I have reviewed the triage vital signs and the nursing notes.  Pertinent labs & imaging results that were available during my care of the patient were reviewed by me and considered in my medical decision making (see chart for details).     Right upper quadrant ultrasound is negative.  Given continued severe pain, CT scan obtained.  This shows likely gastritis versus ulcer.  She has not been having any signs of bleeding such as hematemesis, melena or hematochezia.  She is also been having this pain for about a month.  She endorses now she is been taking BC powder.  I have advised her to stop all NSAIDs or aspirins.  Her vital signs are stable and her hemoglobin is normal.  I will treat her with PPI, H2 blocker, and short course of Norco given the severe pain.  She is advised to follow-up with her primary care physician and I will refer her to a gastroenterologist.  Strict return precautions.  Final Clinical Impressions(s) / ED Diagnoses   Final diagnoses:  Upper abdominal pain  Acute gastric ulcer without hemorrhage or perforation    ED Discharge Orders        Ordered    pantoprazole (PROTONIX) 40 MG tablet  Daily     05/17/18 1123    famotidine (PEPCID) 20 MG tablet  2  times daily     05/17/18 1123    HYDROcodone-acetaminophen (NORCO) 5-325 MG tablet  Every 4 hours PRN     05/17/18 1123       Pricilla LovelessGoldston, Lynze Reddy, MD 05/17/18 1535

## 2018-05-17 NOTE — Discharge Instructions (Signed)
Do not take any nonsteroidal anti-inflammatories such as ibuprofen, Motrin, naproxen, Aleve, or similar medicines.  Do not take aspirin or BC powders.  No Goody powders.  Do not drink alcohol.  Your CT scan shows an ulcer in your stomach.  Take the medicines as prescribed.  If you develop worsening pain, vomiting or vomiting blood, black or bloody stools or any other new/concerning symptoms then return to the ER for evaluation.

## 2018-05-17 NOTE — ED Triage Notes (Signed)
Patient states that she has had pain to her right abdominal area x 1 month. Reports that she is Nausea

## 2018-09-28 ENCOUNTER — Emergency Department (HOSPITAL_BASED_OUTPATIENT_CLINIC_OR_DEPARTMENT_OTHER): Payer: Self-pay

## 2018-09-28 ENCOUNTER — Emergency Department (HOSPITAL_BASED_OUTPATIENT_CLINIC_OR_DEPARTMENT_OTHER)
Admission: EM | Admit: 2018-09-28 | Discharge: 2018-09-28 | Disposition: A | Discharge status: Home / Self Care | Payer: Self-pay | Attending: Emergency Medicine | Admitting: Emergency Medicine

## 2018-09-28 ENCOUNTER — Encounter (HOSPITAL_BASED_OUTPATIENT_CLINIC_OR_DEPARTMENT_OTHER): Payer: Self-pay | Admitting: Emergency Medicine

## 2018-09-28 ENCOUNTER — Other Ambulatory Visit: Payer: Self-pay

## 2018-09-28 DIAGNOSIS — B349 Viral infection, unspecified: Secondary | ICD-10-CM

## 2018-09-28 DIAGNOSIS — Z79899 Other long term (current) drug therapy: Secondary | ICD-10-CM | POA: Insufficient documentation

## 2018-09-28 DIAGNOSIS — F17210 Nicotine dependence, cigarettes, uncomplicated: Secondary | ICD-10-CM | POA: Insufficient documentation

## 2018-09-28 DIAGNOSIS — I10 Essential (primary) hypertension: Secondary | ICD-10-CM | POA: Insufficient documentation

## 2018-09-28 LAB — COMPREHENSIVE METABOLIC PANEL
ALT: 29 U/L (ref 0–44)
AST: 30 U/L (ref 15–41)
Albumin: 4.5 g/dL (ref 3.5–5.0)
Alkaline Phosphatase: 48 U/L (ref 38–126)
Anion gap: 11 (ref 5–15)
BUN: 14 mg/dL (ref 6–20)
CO2: 20 mmol/L — ABNORMAL LOW (ref 22–32)
Calcium: 11 mg/dL — ABNORMAL HIGH (ref 8.9–10.3)
Chloride: 106 mmol/L (ref 98–111)
Creatinine, Ser: 0.74 mg/dL (ref 0.44–1.00)
GFR calc Af Amer: >60 mL/min (ref >60)
GFR calc non Af Amer: >60 mL/min (ref >60)
Glucose, Bld: 106 mg/dL — ABNORMAL HIGH (ref 70–99)
Potassium: 4 mmol/L (ref 3.5–5.1)
Sodium: 137 mmol/L (ref 135–145)
Total Bilirubin: 0.7 mg/dL (ref 0.3–1.2)
Total Protein: 7.7 g/dL (ref 6.5–8.1)

## 2018-09-28 LAB — URINALYSIS, ROUTINE W REFLEX MICROSCOPIC
Bilirubin Urine: NEGATIVE
Glucose, UA: NEGATIVE mg/dL
Hgb urine dipstick: NEGATIVE
Ketones, ur: NEGATIVE mg/dL
Leukocytes, UA: NEGATIVE
Nitrite: NEGATIVE
Protein, ur: NEGATIVE mg/dL
Specific Gravity, Urine: 1.02 (ref 1.005–1.030)
pH: 6 (ref 5.0–8.0)

## 2018-09-28 LAB — CBC
HCT: 44.9 % (ref 36.0–46.0)
Hemoglobin: 14.9 g/dL (ref 12.0–15.0)
MCH: 33.7 pg (ref 26.0–34.0)
MCHC: 33.2 g/dL (ref 30.0–36.0)
MCV: 101.6 fL — ABNORMAL HIGH (ref 80.0–100.0)
Platelets: 248 10*3/uL (ref 150–400)
RBC: 4.42 MIL/uL (ref 3.87–5.11)
RDW: 12.5 % (ref 11.5–15.5)
WBC: 9.2 10*3/uL (ref 4.0–10.5)
nRBC: 0 % (ref 0.0–0.2)

## 2018-09-28 LAB — PREGNANCY, URINE: Preg Test, Ur: NEGATIVE

## 2018-09-28 LAB — GROUP A STREP BY PCR: Group A Strep by PCR: NOT DETECTED

## 2018-09-28 LAB — LIPASE, BLOOD: Lipase: 28 U/L (ref 11–51)

## 2018-09-28 MED ORDER — NAPROXEN 500 MG PO TABS: 500.0000 mg | ORAL_TABLET | Freq: Two times a day (BID) | ORAL | 0 refills | Status: DC

## 2018-09-28 MED ORDER — HYDROCORTISONE 2.5 % RE CREA: TOPICAL_CREAM | RECTAL | 0 refills | Status: DC

## 2018-09-28 MED ORDER — BENZONATATE 100 MG PO CAPS: 100.0000 mg | ORAL_CAPSULE | Freq: Three times a day (TID) | ORAL | 0 refills | Status: DC

## 2018-09-28 MED ORDER — FLUTICASONE PROPIONATE 50 MCG/ACT NA SUSP: 1.0000 | Freq: Every day | NASAL | 0 refills | Status: DC

## 2018-09-28 NOTE — ED Triage Notes (Signed)
Sore throat x 2 days, diarrhea, body aches.

## 2018-09-28 NOTE — ED Notes (Signed)
Pt in room sitting on side of bed  eating [employer] food

## 2018-09-28 NOTE — ED Provider Notes (Signed)
[facility] EMERGENCY DEPARTMENT Provider Note   CSN: [id number] Arrival date & time: 09/28/18  1316     History   Chief Complaint Chief Complaint  Patient presents with  . Sore Throat  . Diarrhea    HPI Melanie Moreno is a 42 y.o. female with a hx of HTN, ovarian cysts, tobacco abuse, and prior appendectomy who presents to the ED URI sxs x 3 days and diarrhea x 1 days. Patient reports congestion, ear pressure, sore throat, and intermittently productive cough with mucous sputum. Relays some chills and fever w/ temp of 101 at home. She states that she tried OTC meds without relief. She states over past 24 hours as had several episodes of non bloody diarrhea. No recent foreign travel or abx. No associated N/V or abdominal pain. She states she is having rectal pain, hx of hemorrhoids. Denies melena, hematochezia, chest pain, or dyspnea.   HPI  Past Medical History:  Diagnosis Date  . Anemia   . Hypertension   . Irregular heart rate   . Ovarian cyst     There are no active problems to display for this patient.   Past Surgical History:  Procedure Laterality Date  . APPENDECTOMY    . BREAST ENHANCEMENT SURGERY       OB History   None      Home Medications    Prior to Admission medications   Medication Sig Start Date End Date Taking? Authorizing Provider  amoxicillin-clavulanate (AUGMENTIN) 875-125 MG tablet Take 1 tablet by mouth 2 (two) times daily. One po bid x 7 days 02/20/18   Molpus, John, MD  famotidine (PEPCID) 20 MG tablet Take 1 tablet (20 mg total) by mouth 2 (two) times daily. 05/17/18   Pricilla Loveless, MD  HYDROcodone-acetaminophen (NORCO) 5-325 MG tablet Take 1-2 tablets by mouth every 4 (four) hours as needed for severe pain. 05/17/18   Pricilla Loveless, MD  metoprolol tartrate (LOPRESSOR) 25 MG tablet Take 25 mg by mouth 2 (two) times daily.    [provider]  oxyCODONE-acetaminophen (PERCOCET) 5-325 MG tablet Take 1 tablet by mouth every 4  (four) hours as needed for severe pain. 02/20/18   Molpus, John, MD  pantoprazole (PROTONIX) 40 MG tablet Take 1 tablet (40 mg total) by mouth daily. 05/17/18   Pricilla Loveless, MD    Family History No family history on file.  Social History Social History   Tobacco Use  . Smoking status: Current Every Day Smoker    Packs/day: 1.00    Types: Cigarettes  . Smokeless tobacco: Never Used  Substance Use Topics  . Alcohol use: Yes    Comment: occassional  . Drug use: Not on file     Allergies   Dilaudid [hydromorphone hcl]   Review of Systems Review of Systems  Constitutional: Positive for chills and fever.  HENT: Positive for congestion, ear pain and sore throat.   Respiratory: Positive for cough. Negative for shortness of breath.   Cardiovascular: Negative for chest pain.  Gastrointestinal: Positive for diarrhea and rectal pain. Negative for abdominal distention, abdominal pain, anal bleeding, blood in stool, constipation, nausea and vomiting.  Genitourinary: Negative for dysuria.  Neurological: Negative for syncope.  All other systems reviewed and are negative.    Physical Exam Updated Vital Signs BP (!) 170/100 (BP Location: Right Arm)   Pulse 78   Temp 99 F (37.2 C) (Oral)   Resp 16   Ht 5\' 4"  (1.626 m)   Wt 54.4 kg  SpO2 100%   BMI 20.60 kg/m   Physical Exam  Constitutional: She appears well-developed and well-nourished.  Non-toxic appearance. No distress.  HENT:  Head: Normocephalic and atraumatic.  Right Ear: Tympanic membrane is not perforated, not erythematous, not retracted and not bulging.  Left Ear: Tympanic membrane is not perforated, not erythematous, not retracted and not bulging.  Nose: Mucosal edema present.  Mouth/Throat: Uvula is midline and oropharynx is clear and moist. No oropharyngeal exudate or posterior oropharyngeal erythema.  Posterior oropharynx is symmetric appearing. Patient tolerating own secretions without difficulty. No trismus.  No drooling. No hot potato voice. No swelling beneath the tongue, submandibular compartment is soft.   Eyes: Pupils are equal, round, and reactive to light. Conjunctivae are normal. Right eye exhibits no discharge. Left eye exhibits no discharge.  Neck: Normal range of motion. Neck supple. No neck rigidity. No edema and no erythema present.  Cardiovascular: Normal rate and regular rhythm.  No murmur heard. Pulmonary/Chest: Breath sounds normal. No respiratory distress. She has no wheezes. She has no rales.  Abdominal: Soft. She exhibits no distension. There is no tenderness. There is no rigidity, no rebound and no guarding.  Genitourinary: Rectal exam shows external hemorrhoid (x 2, < 0.5 cm diameter, non thrombosed, tender to palpation). Rectal exam shows no fissure.  Genitourinary Comments: No gross blood.  Chaperone present.   Lymphadenopathy:    She has no cervical adenopathy.  Neurological: She is alert.  Skin: Skin is warm and dry. No rash noted.  Psychiatric: She has a normal mood and affect. Her behavior is normal.  Nursing note and vitals reviewed.    ED Treatments / Results  Labs (all labs ordered are listed, but only abnormal results are displayed) Labs Reviewed  COMPREHENSIVE METABOLIC PANEL - Abnormal; Notable for the following components:      Result Value   CO2 20 (*)    Glucose, Bld 106 (*)    Calcium 11.0 (*)    All other components within normal limits  CBC - Abnormal; Notable for the following components:   MCV 101.6 (*)    All other components within normal limits  URINALYSIS, ROUTINE W REFLEX MICROSCOPIC - Abnormal; Notable for the following components:   APPearance CLOUDY (*)    All other components within normal limits  GROUP A STREP BY PCR  LIPASE, BLOOD  PREGNANCY, URINE    EKG None  Radiology No results found.  Procedures Procedures (including critical care time)  Medications Ordered in ED Medications - No data to display   Initial  Impression / Assessment and Plan / ED Course  I have reviewed the triage vital signs and the nursing notes.  Pertinent labs & imaging results that were available during my care of the patient were reviewed by me and considered in my medical decision making (see chart for details).   Patient presents to the ED with URI sxs x 3 days and diarrhea x 1 day. Nontoxic appearing, in no apparent distress, vitals WNL other than elevated BP- doubt HTN emergency, PCP recheck. Patient is afebrile in the ED, lungs are CTA, CXR negative for infiltrate, doubt pneumonia. There is no wheezing or signs of respiratory distress. Sxs onset < 7 days, afebrile, no sinus tenderness, doubt acute bacterial sinusitis. Strep negative- no signs of RPA/PTA on exam. No evidence of AOM on exam. No meningeal signs.   Regarding diarrhea w/ rectal pain- non bloody, abdomen nontender, no peritoneal signs, no abx/recent foreign travel. Labs generally reassuring without significant  abnormalities. No leukocytosis or significant electrolyte disturbance. Rectal pain likely due to non thrombosed hemorrhoids- stiz baths, hydrocortisone suppository.   Sxs likely viral.  Supportive measures. PCP follow up. I discussed results, treatment plan, need for follow-up, and return precautions with the patient. Provided opportunity for questions, patient confirmed understanding and is in agreement with plan.   Final Clinical Impressions(s) / ED Diagnoses   Final diagnoses:  Viral illness    ED Discharge Orders         Ordered    fluticasone (FLONASE) 50 MCG/ACT nasal spray  Daily     09/28/18 1731    benzonatate (TESSALON) 100 MG capsule  Every 8 hours     09/28/18 1731    naproxen (NAPROSYN) 500 MG tablet  2 times daily     09/28/18 1731    hydrocortisone (ANUSOL-HC) 2.5 % rectal cream     09/28/18 1731           Carinne Brandenburger, Pleas Koch, PA-C 09/28/18 1732    Raeford Razor, MD 10/02/18 [phone]

## 2018-09-28 NOTE — Discharge Instructions (Addendum)
You were seen in the emergency department today for upper respiratory infection type symptoms and diarrhea.  Your work-up in the emergency department was overall reassuring. Your kidney and liver function were normal. Your chest xray was normal. Your strep test was negative. We suspect your symptoms are related to a virus. You are likely dehydrated from diarrhea, please be sure to drink plenty of electrolyte solutions. You may take immodium for diarrhea per over the counter dosing. We are sending you home with medicines to help with your symptoms. Please take these as prescribed.   We have prescribed you new medication(s) today. Discuss the medications prescribed today with your pharmacist as they can have adverse effects and interactions with your other medicines including over the counter and prescribed medications. Seek medical evaluation if you start to experience new or abnormal symptoms after taking one of these medicines, seek care immediately if you start to experience difficulty breathing, feeling of your throat closing, facial swelling, or rash as these could be indications of a more serious allergic reaction  Follow up with primary care in 3 days, return to the ER for new or worsening symptoms or any other concerns.

## 2019-09-26 ENCOUNTER — Other Ambulatory Visit: Payer: Self-pay

## 2019-09-26 ENCOUNTER — Emergency Department (HOSPITAL_BASED_OUTPATIENT_CLINIC_OR_DEPARTMENT_OTHER): Payer: Self-pay

## 2019-09-26 ENCOUNTER — Encounter (HOSPITAL_BASED_OUTPATIENT_CLINIC_OR_DEPARTMENT_OTHER): Payer: Self-pay | Admitting: Emergency Medicine

## 2019-09-26 ENCOUNTER — Emergency Department (HOSPITAL_BASED_OUTPATIENT_CLINIC_OR_DEPARTMENT_OTHER)
Admission: EM | Admit: 2019-09-26 | Discharge: 2019-09-26 | Disposition: A | Discharge status: Home / Self Care | Payer: Self-pay | Attending: Emergency Medicine | Admitting: Emergency Medicine

## 2019-09-26 DIAGNOSIS — I10 Essential (primary) hypertension: Secondary | ICD-10-CM | POA: Insufficient documentation

## 2019-09-26 DIAGNOSIS — Y9301 Activity, walking, marching and hiking: Secondary | ICD-10-CM | POA: Insufficient documentation

## 2019-09-26 DIAGNOSIS — Y999 Unspecified external cause status: Secondary | ICD-10-CM | POA: Insufficient documentation

## 2019-09-26 DIAGNOSIS — Z79899 Other long term (current) drug therapy: Secondary | ICD-10-CM | POA: Insufficient documentation

## 2019-09-26 DIAGNOSIS — Y92008 Other place in unspecified non-institutional (private) residence as the place of occurrence of the external cause: Secondary | ICD-10-CM | POA: Insufficient documentation

## 2019-09-26 DIAGNOSIS — W19XXXA Unspecified fall, initial encounter: Secondary | ICD-10-CM

## 2019-09-26 DIAGNOSIS — F17210 Nicotine dependence, cigarettes, uncomplicated: Secondary | ICD-10-CM | POA: Insufficient documentation

## 2019-09-26 DIAGNOSIS — Z885 Allergy status to narcotic agent status: Secondary | ICD-10-CM | POA: Insufficient documentation

## 2019-09-26 DIAGNOSIS — W010XXA Fall on same level from slipping, tripping and stumbling without subsequent striking against object, initial encounter: Secondary | ICD-10-CM | POA: Insufficient documentation

## 2019-09-26 DIAGNOSIS — S8002XA Contusion of left knee, initial encounter: Secondary | ICD-10-CM | POA: Insufficient documentation

## 2019-09-26 LAB — PREGNANCY, URINE: Preg Test, Ur: NEGATIVE

## 2019-09-26 MED ORDER — CYCLOBENZAPRINE HCL 10 MG PO TABS: 10.0000 mg | ORAL_TABLET | Freq: Two times a day (BID) | ORAL | 0 refills | Status: DC | PRN

## 2019-09-26 MED ORDER — IBUPROFEN 600 MG PO TABS: 600.0000 mg | ORAL_TABLET | Freq: Four times a day (QID) | ORAL | 0 refills | Status: DC | PRN

## 2019-09-26 NOTE — ED Provider Notes (Signed)
[facility] EMERGENCY DEPARTMENT Provider Note   CSN: [id number] Arrival date & time: 09/26/19  1232     History   Chief Complaint Chief Complaint  Patient presents with  . Fall    HPI Melanie Moreno is a 43 y.o. female.     The history is provided by the patient. No language interpreter was used.  Fall     43 year old female presenting for evaluation of left knee injury.  Patient report last night she was walking out in her wet deck, slipped, fell, striking her left knee against the ground.  She was able to get up, she denies hitting her head or loss of consciousness.  This morning she reported having sharp moderate to severe pain about the left knee worsened with knee flexion.  Pain is nonradiating.  Not improved with taking her left over Percocet.  She denies any associated hip or ankle pain no numbness.  She denies being pregnant.  Past Medical History:  Diagnosis Date  . Anemia   . Hypertension   . Irregular heart rate   . Ovarian cyst     There are no active problems to display for this patient.   Past Surgical History:  Procedure Laterality Date  . APPENDECTOMY    . BREAST ENHANCEMENT SURGERY       OB History   No obstetric history on file.      Home Medications    Prior to Admission medications   Medication Sig Start Date End Date Taking? Authorizing Provider  amoxicillin-clavulanate (AUGMENTIN) 875-125 MG tablet Take 1 tablet by mouth 2 (two) times daily. One po bid x 7 days 02/20/18   Molpus, John, MD  benzonatate (TESSALON) 100 MG capsule Take 1 capsule (100 mg total) by mouth every 8 (eight) hours. 09/28/18   Petrucelli, Samantha R, PA-C  famotidine (PEPCID) 20 MG tablet Take 1 tablet (20 mg total) by mouth 2 (two) times daily. 05/17/18   Pricilla Loveless, MD  fluticasone (FLONASE) 50 MCG/ACT nasal spray Place 1 spray into both nostrils daily. 09/28/18   Petrucelli, Pleas Koch, PA-C  HYDROcodone-acetaminophen (NORCO) 5-325 MG tablet Take 1-2  tablets by mouth every 4 (four) hours as needed for severe pain. 05/17/18   Pricilla Loveless, MD  hydrocortisone (ANUSOL-HC) 2.5 % rectal cream Apply rectally 2 times daily 09/28/18   Petrucelli, Samantha R, PA-C  metoprolol tartrate (LOPRESSOR) 25 MG tablet Take 25 mg by mouth 2 (two) times daily.    [provider]  naproxen (NAPROSYN) 500 MG tablet Take 1 tablet (500 mg total) by mouth 2 (two) times daily. 09/28/18   Petrucelli, Pleas Koch, PA-C  oxyCODONE-acetaminophen (PERCOCET) 5-325 MG tablet Take 1 tablet by mouth every 4 (four) hours as needed for severe pain. 02/20/18   Molpus, John, MD  pantoprazole (PROTONIX) 40 MG tablet Take 1 tablet (40 mg total) by mouth daily. 05/17/18   Pricilla Loveless, MD    Family History History reviewed. No pertinent family history.  Social History Social History   Tobacco Use  . Smoking status: Current Every Day Smoker    Packs/day: 1.00    Types: Cigarettes  . Smokeless tobacco: Never Used  Substance Use Topics  . Alcohol use: Yes    Comment: occassional  . Drug use: Never     Allergies   Dilaudid [hydromorphone hcl]   Review of Systems Review of Systems  Constitutional: Negative for fever.  Musculoskeletal: Positive for arthralgias.  Skin: Negative for wound.  Neurological: Negative for numbness.  Physical Exam Updated Vital Signs BP (!) 151/103 (BP Location: Left Arm)   Pulse 86   Temp 98.3 F (36.8 C) (Oral)   Resp 18   Ht 5\' 4"  (1.626 m)   Wt 54.4 kg   SpO2 100%   BMI 20.60 kg/m   Physical Exam Vitals signs and nursing note reviewed.  Constitutional:      General: She is not in acute distress.    Appearance: She is well-developed.  HENT:     Head: Atraumatic.  Eyes:     Conjunctiva/sclera: Conjunctivae normal.  Neck:     Musculoskeletal: Neck supple.  Musculoskeletal:        General: Tenderness (Left knee: Tenderness to tibial tuberosity with associated bruising and swelling noted.  Decreased knee  flexion secondary to pain.  No tenderness to medial or lateral joint line and no joint laxity.  Patella is located.) present.     Comments: Left hip and left ankle nontender to palpation.  Skin:    Findings: No rash.  Neurological:     Mental Status: She is alert.      ED Treatments / Results  Labs (all labs ordered are listed, but only abnormal results are displayed) Labs Reviewed  PREGNANCY, URINE    EKG None  Radiology Dg Knee Complete 4 Views Left  Result Date: 09/26/2019 CLINICAL DATA:  Patient fell last evening now with pain and bruising involving the medial side of knee. EXAM: LEFT KNEE - COMPLETE 4+ VIEW COMPARISON:  None. FINDINGS: No fracture or dislocation. Joint spaces are preserved. No evidence of chondrocalcinosis. No joint effusion. Minimal enthesopathic change involving the superior and inferior poles of the patella. Regional soft tissues appear normal. No radiopaque foreign body. IMPRESSION: 1. No acute findings. 2. Minimal enthesopathic change involving the superior and inferior poles of patella. Electronically Signed   By: Simonne ComeJohn  Watts M.D.   On: 09/26/2019 14:44    Procedures Procedures (including critical care time)  Medications Ordered in ED Medications - No data to display   Initial Impression / Assessment and Plan / ED Course  I have reviewed the triage vital signs and the nursing notes.  Pertinent labs & imaging results that were available during my care of the patient were reviewed by me and considered in my medical decision making (see chart for details).        BP (!) 151/103 (BP Location: Left Arm)   Pulse 86   Temp 98.3 F (36.8 C) (Oral)   Resp 18   Ht 5\' 4"  (1.626 m)   Wt 54.4 kg   SpO2 100%   BMI 20.60 kg/m    Final Clinical Impressions(s) / ED Diagnoses   Final diagnoses:  Contusion of left knee, initial encounter    ED Discharge Orders         Ordered    ibuprofen (ADVIL) 600 MG tablet  Every 6 hours PRN     09/26/19  1449    cyclobenzaprine (FLEXERIL) 10 MG tablet  2 times daily PRN     09/26/19 1449         2:27 PM Patient injured left knee when she fell yesterday.  Bruising noted to the inferior patellar region at the tibial tuberosity.  Will obtain x-ray.  Ice applied.  2:43 PM Xray of L knee without acute fx or dislocation.  Pt diagnosed with L knee contusion.  RICE therapy recommended.  Knee sleeve provided.  Ortho referral given.  Return precaution discussed.  Domenic Moras, PA-C 09/26/19 1449    Wyvonnia Dusky, MD 09/27/19 [phone]

## 2019-09-26 NOTE — ED Triage Notes (Signed)
Pt reports slipping last night and falling on left knee. Bruising ans swelling apparent. Pt reports difficulty walking. Pt reports  percocet 3 hrs pta

## 2019-09-26 NOTE — ED Notes (Signed)
Xray ordered as a 2 view despite being an injury. Spoke w/ provider and decided to let him assess the patient before imaging the pt.

## 2020-01-11 ENCOUNTER — Encounter (HOSPITAL_BASED_OUTPATIENT_CLINIC_OR_DEPARTMENT_OTHER): Payer: Self-pay | Admitting: *Deleted

## 2020-01-11 ENCOUNTER — Other Ambulatory Visit: Payer: Self-pay

## 2020-01-11 ENCOUNTER — Emergency Department (HOSPITAL_BASED_OUTPATIENT_CLINIC_OR_DEPARTMENT_OTHER): Payer: Worker's Compensation

## 2020-01-11 ENCOUNTER — Emergency Department (HOSPITAL_BASED_OUTPATIENT_CLINIC_OR_DEPARTMENT_OTHER)
Admission: EM | Admit: 2020-01-11 | Discharge: 2020-01-11 | Disposition: A | Discharge status: Home / Self Care | Payer: Worker's Compensation | Attending: Emergency Medicine | Admitting: Emergency Medicine

## 2020-01-11 DIAGNOSIS — Z79899 Other long term (current) drug therapy: Secondary | ICD-10-CM | POA: Diagnosis not present

## 2020-01-11 DIAGNOSIS — Y9389 Activity, other specified: Secondary | ICD-10-CM | POA: Insufficient documentation

## 2020-01-11 DIAGNOSIS — S52501A Unspecified fracture of the lower end of right radius, initial encounter for closed fracture: Secondary | ICD-10-CM | POA: Insufficient documentation

## 2020-01-11 DIAGNOSIS — F17210 Nicotine dependence, cigarettes, uncomplicated: Secondary | ICD-10-CM | POA: Diagnosis not present

## 2020-01-11 DIAGNOSIS — S6981XA Other specified injuries of right wrist, hand and finger(s), initial encounter: Secondary | ICD-10-CM | POA: Diagnosis present

## 2020-01-11 DIAGNOSIS — W230XXA Caught, crushed, jammed, or pinched between moving objects, initial encounter: Secondary | ICD-10-CM | POA: Diagnosis not present

## 2020-01-11 DIAGNOSIS — Y9289 Other specified places as the place of occurrence of the external cause: Secondary | ICD-10-CM | POA: Insufficient documentation

## 2020-01-11 DIAGNOSIS — I10 Essential (primary) hypertension: Secondary | ICD-10-CM | POA: Insufficient documentation

## 2020-01-11 DIAGNOSIS — Y990 Civilian activity done for income or pay: Secondary | ICD-10-CM | POA: Insufficient documentation

## 2020-01-11 MED ORDER — OXYCODONE-ACETAMINOPHEN 5-325 MG PO TABS
1.0000 | ORAL_TABLET | Freq: Once | ORAL | Status: AC
  Administered 2020-01-11: 13:00:00 1 via ORAL
  Filled 2020-01-11: qty 1

## 2020-01-11 MED ORDER — OXYCODONE-ACETAMINOPHEN 5-325 MG PO TABS: 1.0000 | ORAL_TABLET | ORAL | 0 refills | Status: AC | PRN

## 2020-01-11 MED ORDER — OXYCODONE-ACETAMINOPHEN 5-325 MG PO TABS: 1.0000 | ORAL_TABLET | ORAL | 0 refills | Status: DC | PRN

## 2020-01-11 MED FILL — OXYCODONE-ACETAMINOPHEN 5-3: 5-325 | 3 days supply | Qty: 10 | Fill #0

## 2020-01-11 NOTE — ED Triage Notes (Signed)
States her right hand was crushed between pans at work this am. Swelling and bruising noted.

## 2020-01-11 NOTE — ED Provider Notes (Signed)
[place] EMERGENCY DEPARTMENT Provider Note   CSN: [id number] Arrival date & time: 01/11/20  1219     History Chief Complaint  Patient presents with  . Hand Injury    Melanie Moreno is a 44 y.o. female.  Presents to ER with wrist injury.  Patient reports that her hand/wrist got crushed by a pan at work.  Sudden onset pain, pain with ranging her wrist.  No numbness or weakness.  Has had some swelling and bruising to the affected area.  Right-hand-dominant.  Denies other injury.  HPI     Past Medical History:  Diagnosis Date  . Anemia   . Hypertension   . Irregular heart rate   . Ovarian cyst     There are no problems to display for this patient.   Past Surgical History:  Procedure Laterality Date  . APPENDECTOMY    . BREAST ENHANCEMENT SURGERY       OB History   No obstetric history on file.     No family history on file.  Social History   Tobacco Use  . Smoking status: Current Every Day Smoker    Packs/day: 1.00    Types: Cigarettes  . Smokeless tobacco: Never Used  Substance Use Topics  . Alcohol use: Yes    Comment: occassional  . Drug use: Never    Home Medications Prior to Admission medications   Medication Sig Start Date End Date Taking? Authorizing Provider  metoprolol tartrate (LOPRESSOR) 25 MG tablet Take 25 mg by mouth 2 (two) times daily.   Yes [provider]  cyclobenzaprine (FLEXERIL) 10 MG tablet Take 1 tablet (10 mg total) by mouth 2 (two) times daily as needed for muscle spasms. 09/26/19   Domenic Moras, PA-C  ibuprofen (ADVIL) 600 MG tablet Take 1 tablet (600 mg total) by mouth every 6 (six) hours as needed. 09/26/19   Domenic Moras, PA-C  oxyCODONE-acetaminophen (PERCOCET) 5-325 MG tablet Take 1 tablet by mouth every 4 (four) hours as needed for up to 3 days for severe pain. 01/11/20 01/14/20  Lucrezia Starch, MD    Allergies    Dilaudid [hydromorphone hcl]  Review of Systems   Review of Systems  Constitutional:  Negative for chills and fever.  HENT: Negative for ear pain and sore throat.   Eyes: Negative for pain and visual disturbance.  Respiratory: Negative for cough and shortness of breath.   Cardiovascular: Negative for chest pain and palpitations.  Gastrointestinal: Negative for abdominal pain and vomiting.  Genitourinary: Negative for dysuria and hematuria.  Musculoskeletal: Positive for arthralgias. Negative for back pain.  Skin: Negative for color change and rash.  Neurological: Negative for seizures and syncope.  All other systems reviewed and are negative.   Physical Exam Updated Vital Signs BP (!) 179/99   Pulse 70   Temp 98.7 F (37.1 C) (Oral)   Resp 14   Ht 5\' 4"  (1.626 m)   Wt 53.5 kg   SpO2 99%   BMI 20.25 kg/m   Physical Exam Constitutional:      Appearance: Normal appearance.  HENT:     Head: Normocephalic and atraumatic.     Nose: Nose normal.     Mouth/Throat:     Mouth: Mucous membranes are moist.  Eyes:     Extraocular Movements: Extraocular movements intact.     Pupils: Pupils are equal, round, and reactive to light.  Cardiovascular:     Rate and Rhythm: Normal rate.     Pulses:  Normal pulses.  Pulmonary:     Effort: Pulmonary effort is normal. No respiratory distress.  Musculoskeletal:     Cervical back: Normal range of motion.     Comments: Right upper extremity: Obvious swelling, ecchymosis over right wrist, proximal right hand, range of motion right wrist decreased due to pain, tenderness to palpation most prominent over distal radius, normal radial pulse  Skin:    General: Skin is warm and dry.     Capillary Refill: Capillary refill takes less than 2 seconds.  Neurological:     General: No focal deficit present.     Mental Status: She is alert and oriented to person, place, and time.  Psychiatric:        Mood and Affect: Mood normal.        Behavior: Behavior normal.     ED Results / Procedures / Treatments   Labs (all labs ordered are  listed, but only abnormal results are displayed) Labs Reviewed - No data to display  EKG None  Radiology DG Hand Complete Right  Result Date: 01/11/2020 CLINICAL DATA:  Her hand was cruched between pans at work this am. Pain. EXAM: RIGHT HAND - COMPLETE 3+ VIEW COMPARISON:  None. FINDINGS: Cortical irregularity about the dorsal aspect of the distal radius suspicious for nondisplaced fracture. No additional fracture of the hand. Short fifth digit middle phalanx, congenital. There is no evidence of arthropathy or other focal bone abnormality. Mild generalized soft tissue edema about the radial aspect of the hand and wrist. IMPRESSION: Findings suspicious for nondisplaced distal radius fracture best appreciated on the lateral view. Soft tissue edema. Electronically Signed   By: Narda Rutherford M.D.   On: 01/11/2020 12:53    Procedures Procedures (including critical care time)  Medications Ordered in ED Medications  oxyCODONE-acetaminophen (PERCOCET/ROXICET) 5-325 MG per tablet 1 tablet (1 tablet Oral Given 01/11/20 1311)    ED Course  I have reviewed the triage vital signs and the nursing notes.  Pertinent labs & imaging results that were available during my care of the patient were reviewed by me and considered in my medical decision making (see chart for details).    MDM Rules/Calculators/A&P                      44 year old lady who presented to ER with a complaint of of right wrist/hand pain after injury at work.  X-ray concerning for distal radius fracture.  Nondisplaced.  Pain controlled in ER, neurovascularly intact.  Placed in sugar tong splint, recommend follow-up with Ortho.  Nonweightbearing.  Sling as needed.    After the discussed management above, the patient was determined to be safe for discharge.  The patient was in agreement with this plan and all questions regarding their care were answered.  ED return precautions were discussed and the patient will return to the ED  with any significant worsening of condition.   Final Clinical Impression(s) / ED Diagnoses Final diagnoses:  Closed fracture of distal end of right radius, unspecified fracture morphology, initial encounter    Rx / DC Orders ED Discharge Orders         Ordered    oxyCODONE-acetaminophen (PERCOCET) 5-325 MG tablet  Every 4 hours PRN,   Status:  Discontinued     01/11/20 1320    oxyCODONE-acetaminophen (PERCOCET) 5-325 MG tablet  Every 4 hours PRN     01/11/20 1320           Katheen Aslin, [place] Livings, MD  01/11/20 1512  

## 2020-01-11 NOTE — Discharge Instructions (Signed)
Please follow-up with the orthopedist as recommended.  Should be seen in their clinic in about a week.  Continue splint as currently placed.  Keep dry.  Recommend no weightbearing activities of your right extremity.  Can use sling as needed for support, but you do not need to have this on at all times.  Take pain medicine as prescribed as needed for pain control.  Note this can make you drowsy and should not be taken while driving or operating heavy machinery.  Additionally recommend Tylenol Motrin as needed for pain control.

## 2020-01-15 ENCOUNTER — Ambulatory Visit: Payer: Medicaid Other | Admitting: Family Medicine

## 2020-01-15 NOTE — Progress Notes (Deleted)
  Melanie Moreno - 44 y.o. female MRN [id number]  Date of birth: 07-09-1976  SUBJECTIVE:  Including CC & ROS.  No chief complaint on file.   Melanie Moreno is a 44 y.o. female that is  ***.  ***   Review of Systems See HPI   HISTORY: Past Medical, Surgical, Social, and Family History Reviewed & Updated per EMR.   Pertinent Historical Findings include:  Past Medical History:  Diagnosis Date  . Anemia   . Hypertension   . Irregular heart rate   . Ovarian cyst     Past Surgical History:  Procedure Laterality Date  . APPENDECTOMY    . BREAST ENHANCEMENT SURGERY      No family history on file.  Social History   Socioeconomic History  . Marital status: Single    Spouse name: Not on file  . Number of children: Not on file  . Years of education: Not on file  . Highest education level: Not on file  Occupational History  . Not on file  Tobacco Use  . Smoking status: Current Every Day Smoker    Packs/day: 1.00    Types: Cigarettes  . Smokeless tobacco: Never Used  Substance and Sexual Activity  . Alcohol use: Yes    Comment: occassional  . Drug use: Never  . Sexual activity: Yes  Other Topics Concern  . Not on file  Social History Narrative  . Not on file   Social Determinants of Health   Financial Resource Strain:   . Difficulty of Paying Living Expenses:   Food Insecurity:   . Worried About Programme researcher, broadcasting/film/video in the Last Year:   . Barista in the Last Year:   Transportation Needs:   . Freight forwarder (Medical):   Marland Kitchen Lack of Transportation (Non-Medical):   Physical Activity:   . Days of Exercise per Week:   . Minutes of Exercise per Session:   Stress:   . Feeling of Stress :   Social Connections:   . Frequency of Communication with Friends and Family:   . Frequency of Social Gatherings with Friends and Family:   . Attends Religious Services:   . Active Member of Clubs or Organizations:   . Attends Banker Meetings:   Marland Kitchen Marital  Status:   Intimate Partner Violence:   . Fear of Current or Ex-Partner:   . Emotionally Abused:   Marland Kitchen Physically Abused:   . Sexually Abused:      PHYSICAL EXAM:  VS: There were no vitals taken for this visit. Physical Exam Gen: NAD, alert, cooperative with exam, well-appearing MSK:  ***      ASSESSMENT & PLAN:   No problem-specific Assessment & Plan notes found for this encounter.

## 2020-01-16 ENCOUNTER — Emergency Department (HOSPITAL_BASED_OUTPATIENT_CLINIC_OR_DEPARTMENT_OTHER): Payer: Worker's Compensation

## 2020-01-16 ENCOUNTER — Other Ambulatory Visit: Payer: Self-pay

## 2020-01-16 ENCOUNTER — Emergency Department (HOSPITAL_BASED_OUTPATIENT_CLINIC_OR_DEPARTMENT_OTHER)
Admission: EM | Admit: 2020-01-16 | Discharge: 2020-01-16 | Disposition: A | Discharge status: Home / Self Care | Payer: Worker's Compensation | Attending: Emergency Medicine | Admitting: Emergency Medicine

## 2020-01-16 DIAGNOSIS — S52501D Unspecified fracture of the lower end of right radius, subsequent encounter for closed fracture with routine healing: Secondary | ICD-10-CM | POA: Insufficient documentation

## 2020-01-16 DIAGNOSIS — Z79899 Other long term (current) drug therapy: Secondary | ICD-10-CM | POA: Insufficient documentation

## 2020-01-16 DIAGNOSIS — I10 Essential (primary) hypertension: Secondary | ICD-10-CM | POA: Insufficient documentation

## 2020-01-16 DIAGNOSIS — X58XXXD Exposure to other specified factors, subsequent encounter: Secondary | ICD-10-CM | POA: Insufficient documentation

## 2020-01-16 DIAGNOSIS — R202 Paresthesia of skin: Secondary | ICD-10-CM | POA: Diagnosis not present

## 2020-01-16 DIAGNOSIS — F17210 Nicotine dependence, cigarettes, uncomplicated: Secondary | ICD-10-CM | POA: Insufficient documentation

## 2020-01-16 DIAGNOSIS — R0789 Other chest pain: Secondary | ICD-10-CM | POA: Diagnosis present

## 2020-01-16 LAB — CBC WITH DIFFERENTIAL/PLATELET
Abs Immature Granulocytes: 0.02 10*3/uL (ref 0.00–0.07)
Basophils Absolute: 0 10*3/uL (ref 0.0–0.1)
Basophils Relative: 0 %
Eosinophils Absolute: 0 10*3/uL (ref 0.0–0.5)
Eosinophils Relative: 1 %
HCT: 43.3 % (ref 36.0–46.0)
Hemoglobin: 14.8 g/dL (ref 12.0–15.0)
Immature Granulocytes: 0 %
Lymphocytes Relative: 31 %
Lymphs Abs: 2.6 10*3/uL (ref 0.7–4.0)
MCH: 36.8 pg — ABNORMAL HIGH (ref 26.0–34.0)
MCHC: 34.2 g/dL (ref 30.0–36.0)
MCV: 107.7 fL — ABNORMAL HIGH (ref 80.0–100.0)
Monocytes Absolute: 0.7 10*3/uL (ref 0.1–1.0)
Monocytes Relative: 8 %
Neutro Abs: 5.1 10*3/uL (ref 1.7–7.7)
Neutrophils Relative %: 60 %
Platelets: 234 10*3/uL (ref 150–400)
RBC: 4.02 MIL/uL (ref 3.87–5.11)
RDW: 13.2 % (ref 11.5–15.5)
WBC: 8.5 10*3/uL (ref 4.0–10.5)
nRBC: 0 % (ref 0.0–0.2)

## 2020-01-16 LAB — BASIC METABOLIC PANEL
Anion gap: 12 (ref 5–15)
BUN: 12 mg/dL (ref 6–20)
CO2: 22 mmol/L (ref 22–32)
Calcium: 9.4 mg/dL (ref 8.9–10.3)
Chloride: 100 mmol/L (ref 98–111)
Creatinine, Ser: 0.68 mg/dL (ref 0.44–1.00)
GFR calc Af Amer: >60 mL/min (ref >60)
GFR calc non Af Amer: >60 mL/min (ref >60)
Glucose, Bld: 128 mg/dL — ABNORMAL HIGH (ref 70–99)
Potassium: 3.6 mmol/L (ref 3.5–5.1)
Sodium: 134 mmol/L — ABNORMAL LOW (ref 135–145)

## 2020-01-16 LAB — TROPONIN I (HIGH SENSITIVITY): Troponin I (High Sensitivity): 3 ng/L (ref <18)

## 2020-01-16 MED ORDER — OXYCODONE-ACETAMINOPHEN 5-325 MG PO TABS
1.0000 | ORAL_TABLET | Freq: Once | ORAL | Status: AC
  Administered 2020-01-16: 23:00:00 1 via ORAL
  Filled 2020-01-16: qty 1

## 2020-01-16 NOTE — Discharge Instructions (Signed)
Today you received medications that may make you sleepy or impair your ability to make decisions.  For the next 24 hours please do not drive, operate heavy machinery, care for a small child with out another adult present, or perform any activities that may cause harm to you or someone else if you were to fall asleep or be impaired.   Today you received medications that may make you sleepy or impair your ability to make decisions.  For the next 24 hours please do not drive, operate heavy machinery, care for a small child with out another adult present, or perform any activities that may cause harm to you or someone else if you were to fall asleep or be impaired.   You are being prescribed a medication which may make you sleepy. Please follow up of listed precautions for at least 24 hours after taking one dose.

## 2020-01-16 NOTE — ED Provider Notes (Signed)
[facility] EMERGENCY DEPARTMENT Provider Note   CSN: [id number] Arrival date & time: 01/16/20  2045     History Chief Complaint  Patient presents with  . Chest Pain    Melanie Moreno is a 44 y.o. female with a past medical history of hypertension, anemia, who presents today for evaluation of multiple complaints. Her primary concern today is paresthesias of the dorsal aspect of the fingers on her right hand.  She had a distal right radius fracture that was splinted on 01/11/2020.  She reports that the paresthesias are new today.  She reports she has been elevating her hand without this until today.  She denies any recurrent trauma.  There is normal sensation on the palmar aspect of the fingers.  She denies any weakness.  She also reports that over the past 2 days she has developed chest pain only when she coughs.  She states that she has a chronic cough at baseline from smoking that is unchanged however the pain in her chest is new.  She denies any swelling in the right upper arm.    HPI     Past Medical History:  Diagnosis Date  . Anemia   . Hypertension   . Irregular heart rate   . Ovarian cyst     There are no problems to display for this patient.   Past Surgical History:  Procedure Laterality Date  . APPENDECTOMY    . BREAST ENHANCEMENT SURGERY       OB History   No obstetric history on file.     No family history on file.  Social History   Tobacco Use  . Smoking status: Current Every Day Smoker    Packs/day: 1.00    Types: Cigarettes  . Smokeless tobacco: Never Used  Substance Use Topics  . Alcohol use: Yes    Comment: occassional  . Drug use: Never    Home Medications Prior to Admission medications   Medication Sig Start Date End Date Taking? Authorizing Provider  cyclobenzaprine (FLEXERIL) 10 MG tablet Take 1 tablet (10 mg total) by mouth 2 (two) times daily as needed for muscle spasms. 09/26/19   Fayrene Helper, PA-C  ibuprofen (ADVIL) 600 MG  tablet Take 1 tablet (600 mg total) by mouth every 6 (six) hours as needed. 09/26/19   Fayrene Helper, PA-C  metoprolol tartrate (LOPRESSOR) 25 MG tablet Take 25 mg by mouth 2 (two) times daily.    [provider]    Allergies    Dilaudid [hydromorphone hcl]  Review of Systems   Review of Systems  Constitutional: Negative for chills, diaphoresis and fever.  HENT: Negative for congestion.   Respiratory: Positive for cough (Baseline). Negative for shortness of breath.   Cardiovascular: Positive for chest pain (Only when coughs).  Gastrointestinal: Negative for abdominal pain, diarrhea, nausea and vomiting.  Musculoskeletal: Negative for back pain and neck pain.  Neurological: Negative for weakness and headaches.       Subjective decreased sensation along the dorsal aspect of all of the fingers on her right hand  All other systems reviewed and are negative.   Physical Exam Updated Vital Signs BP (!) 155/98 (BP Location: Right Arm)   Pulse 83   Temp 99.3 F (37.4 C) (Oral)   Resp 20   Ht 5\' 4"  (1.626 m)   Wt 52.2 kg   SpO2 99%   BMI 19.74 kg/m   Physical Exam Vitals and nursing note reviewed.  Constitutional:  General: She is not in acute distress.    Appearance: She is well-developed. She is not diaphoretic.  HENT:     Head: Normocephalic and atraumatic.  Eyes:     General: No scleral icterus.       Right eye: No discharge.        Left eye: No discharge.     Conjunctiva/sclera: Conjunctivae normal.  Cardiovascular:     Rate and Rhythm: Normal rate and regular rhythm.     Pulses:          Radial pulses are 2+ on the right side and 2+ on the left side.       Dorsalis pedis pulses are 2+ on the right side and 2+ on the left side.       Posterior tibial pulses are 2+ on the right side and 2+ on the left side.     Heart sounds: Normal heart sounds. No murmur.  Pulmonary:     Effort: Pulmonary effort is normal. No accessory muscle usage or respiratory distress.      Breath sounds: Normal breath sounds. No stridor.  Chest:     Chest wall: Tenderness (Patient over anterior chest wall both recreates and exacerbates her reported pain.) present. No deformity or crepitus.  Abdominal:     General: There is no distension.  Musculoskeletal:        General: No deformity.     Cervical back: Normal range of motion.     Right lower leg: No tenderness. No edema.     Left lower leg: No tenderness. No edema.     Comments: Splint present on right forearm sugar tong splint.  This was removed.  There is minimal swelling of the wrist.  There is no tenderness to palpation along the proximal forearm.  Compartments are soft and easily compressible without pain. There is no edema of the right upper arm.  Skin:    General: Skin is warm and dry.  Neurological:     Mental Status: She is alert.     Motor: No abnormal muscle tone.     Comments: Initially subjective decreased sensation to light touch over only the dorsum of all fingers on patient's right hand, sensation intact on the palmar aspect. After splint removal sensation returned to normal and was intact to light touch.  Psychiatric:        Behavior: Behavior normal.     ED Results / Procedures / Treatments   Labs (all labs ordered are listed, but only abnormal results are displayed) Labs Reviewed  BASIC METABOLIC PANEL - Abnormal; Notable for the following components:      Result Value   Sodium 134 (*)    Glucose, Bld 128 (*)    All other components within normal limits  CBC WITH DIFFERENTIAL/PLATELET - Abnormal; Notable for the following components:   MCV 107.7 (*)    MCH 36.8 (*)    All other components within normal limits  TROPONIN I (HIGH SENSITIVITY)  TROPONIN I (HIGH SENSITIVITY)    EKG EKG Interpretation  Date/Time:  Saturday January 16 2020 21:09:25 EDT Ventricular Rate:  95 PR Interval:  140 QRS Duration: 76 QT Interval:  356 QTC Calculation: 447 R Axis:   80 Text Interpretation: Normal  sinus rhythm Minimal voltage criteria for LVH, may be normal variant ( Sokolow-Lyon ) Borderline ECG T wave flatting inferiorly Confirmed by Rolan Bucco [phone]) on 01/16/2020 10:41:05 PM   Radiology DG Chest Port 1 View  Result Date: 01/16/2020  CLINICAL DATA:  Chest pain with coughing EXAM: PORTABLE CHEST 1 VIEW COMPARISON:  September 28, 2018 FINDINGS: The heart size and mediastinal contours are within normal limits. Both lungs are clear. The visualized skeletal structures are unremarkable. IMPRESSION: No active disease. Electronically Signed   By: Prudencio Pair M.D.   On: 01/16/2020 22:35    Procedures .Splint Application  Date/Time: 01/17/2020 12:14 AM Performed by: Lorin Glass, PA-C Authorized by: Lorin Glass, PA-C   Consent:    Consent obtained:  Verbal   Consent given by:  Patient   Risks discussed:  Discoloration, numbness, pain and swelling   Alternatives discussed:  No treatment, alternative treatment and referral Pre-procedure details:    Pre-procedure CMS: Had been subjectively decreased, however normalized after removal of previous splint.   Skin color:  Ecchymosis, however brisk capillary refill Procedure details:    Laterality:  Right   Location:  Wrist   Wrist:  R wrist   Splint type:  Sugar tong   Supplies:  Cotton padding, Ortho-Glass and elastic bandage Post-procedure details:    Pain:  Improved   Sensation:  Unchanged (Intact)   Skin color:  Unchanged, still brisk capillary refill.   Patient tolerance of procedure:  Tolerated well, no immediate complications   (including critical care time)  Medications Ordered in ED Medications  oxyCODONE-acetaminophen (PERCOCET/ROXICET) 5-325 MG per tablet 1 tablet (1 tablet Oral Given 01/16/20 2254)    ED Course  I have reviewed the triage vital signs and the nursing notes.  Pertinent labs & imaging results that were available during my care of the patient were reviewed by me and considered in my  medical decision making (see chart for details).    MDM Rules/Calculators/A&P                     Patient presents today for evaluation of 2 complaints. Her primary complaint is decreased sensation to light touch over the dorsal aspect of her right hand which is in a splint.  Splint was removed, after which her sensation returned to normal.  She is scheduled to follow-up with hand early this week.  New sugar tong splint was applied, inspected splint after with no change in sensation.  She does not have significant edema and does not have any evidence of compartment syndrome.  We discussed the importance of continuing elevation and conservative measures at home.  She also reports chest pain only when she coughs.  She has a chronic cough at baseline from her smoking.  She does not have any edema of the right upper arm and no tenderness over the deep venous structures.  No edema or calf tenderness.  In addition labs are obtained and reviewed without significant hematologic or electrolyte derangements.  Troponin is not elevated, EKG without evidence of ischemia.  Patient is not consistently tachycardic or tachypneic, her pain is really producible with palpation over the chest wall. Suspect musculoskeletal cause of her chest pain.  Delta troponin was not obtained as her pain has been going on for 2 days.  Given that it is consistently recreatable no indication for additional evaluation at this time.  Do not suspect DVT clinically.   Return precautions were discussed with patient who states their understanding.  At the time of discharge patient denied any unaddressed complaints or concerns.  Patient is agreeable for discharge home.  Note: Portions of this report may have been transcribed using voice recognition software. Every effort was made to ensure accuracy;  however, inadvertent computerized transcription errors may be present Final Clinical Impression(s) / ED Diagnoses Final diagnoses:  Paresthesia of  right arm    Rx / DC Orders ED Discharge Orders    None       Cristina Gong, PA-C 01/17/20 0016    Rolan Bucco, MD 01/17/20 [phone]

## 2020-01-16 NOTE — ED Notes (Signed)
Pt verbalizes understanding that a driver is required for discharge. Pt endorses that daughter is driving

## 2020-01-16 NOTE — ED Triage Notes (Signed)
States she fx her R wrist on Monday, was seen and splinted. States today she cannot feel the dorsal surface of R hand. Also reports L sided constant chest pressure, worse when "awake and upright". Denies worsening with inspiration.

## 2020-01-18 ENCOUNTER — Ambulatory Visit (INDEPENDENT_AMBULATORY_CARE_PROVIDER_SITE_OTHER): Payer: Worker's Compensation | Admitting: Family Medicine

## 2020-01-18 ENCOUNTER — Encounter: Payer: Self-pay | Admitting: Family Medicine

## 2020-01-18 ENCOUNTER — Ambulatory Visit (HOSPITAL_BASED_OUTPATIENT_CLINIC_OR_DEPARTMENT_OTHER)
Admission: RE | Admit: 2020-01-18 | Discharge: 2020-01-18 | Disposition: A | Discharge status: Home / Self Care | Payer: Worker's Compensation | Source: Ambulatory Visit | Attending: Family Medicine | Admitting: Family Medicine

## 2020-01-18 ENCOUNTER — Other Ambulatory Visit: Payer: Self-pay

## 2020-01-18 VITALS — BP 156/106 | HR 74 | Ht 64.0 in | Wt 115.0 lb

## 2020-01-18 DIAGNOSIS — S52591A Other fractures of lower end of right radius, initial encounter for closed fracture: Secondary | ICD-10-CM | POA: Diagnosis present

## 2020-01-18 DIAGNOSIS — S52501A Unspecified fracture of the lower end of right radius, initial encounter for closed fracture: Secondary | ICD-10-CM | POA: Insufficient documentation

## 2020-01-18 NOTE — Patient Instructions (Signed)
Nice to meet you Please try vitamin k2  Please send me a message in MyChart with any questions or updates.  Please see me back in 3 weeks.   --Dr. Jordan Likes

## 2020-01-18 NOTE — Assessment & Plan Note (Signed)
Injury occurred at work on 3/22.  Independent review x-ray from today showing a nondisplaced distal radius fracture. -Placed in Exos. -Counseled supportive care. -Follow-up in 3 weeks. -Provided work note.

## 2020-01-18 NOTE — Progress Notes (Signed)
Melanie Moreno - 44 y.o. female MRN [id number]  Date of birth: [birth year]  SUBJECTIVE:  Including CC & ROS.  Chief Complaint  Patient presents with  . Wrist Injury    right wrist x 01/11/2020    Melanie Moreno is a 44 y.o. female that is presenting with right wrist pain.  She suffered an injury at work.  She was seen in the emergency department on 3/22 and placed in a posterior splint.  She has had swelling and bruising of the dorsal distal radius.  She did have numbness and tingling was seen in the emergency department again on 3/27 and the strip splint was changed.  Has had improvement of numbness and tingling..  Independent review of the right hand x-ray from 3/22 shows a cortical irregularity of the dorsal distal radius.   Review of Systems See HPI   HISTORY: Past Medical, Surgical, Social, and Family History Reviewed & Updated per EMR.   Pertinent Historical Findings include:  Past Medical History:  Diagnosis Date  . Anemia   . Hypertension   . Irregular heart rate   . Ovarian cyst     Past Surgical History:  Procedure Laterality Date  . APPENDECTOMY    . BREAST ENHANCEMENT SURGERY      No family history on file.  Social History   Socioeconomic History  . Marital status: Single    Spouse name: Not on file  . Number of children: Not on file  . Years of education: Not on file  . Highest education level: Not on file  Occupational History  . Not on file  Tobacco Use  . Smoking status: Current Every Day Smoker    Packs/day: 1.00    Types: Cigarettes  . Smokeless tobacco: Never Used  Substance and Sexual Activity  . Alcohol use: Yes    Comment: occassional  . Drug use: Never  . Sexual activity: Yes  Other Topics Concern  . Not on file  Social History Narrative  . Not on file   Social Determinants of Health   Financial Resource Strain:   . Difficulty of Paying Living Expenses:   Food Insecurity:   . Worried About Charity fundraiser in the Last Year:   .  Arboriculturist in the Last Year:   Transportation Needs:   . Film/video editor (Medical):   Marland Kitchen Lack of Transportation (Non-Medical):   Physical Activity:   . Days of Exercise per Week:   . Minutes of Exercise per Session:   Stress:   . Feeling of Stress :   Social Connections:   . Frequency of Communication with Friends and Family:   . Frequency of Social Gatherings with Friends and Family:   . Attends Religious Services:   . Active Member of Clubs or Organizations:   . Attends Archivist Meetings:   Marland Kitchen Marital Status:   Intimate Partner Violence:   . Fear of Current or Ex-Partner:   . Emotionally Abused:   Marland Kitchen Physically Abused:   . Sexually Abused:      PHYSICAL EXAM:  VS: BP (!) 156/106   Pulse 74   Ht 5\' 4"  (1.626 m)   Wt 115 lb (52.2 kg)   BMI 19.74 kg/m  Physical Exam Gen: NAD, alert, cooperative with exam, well-appearing MSK:  Right wrist: Ecchymosis and mild swelling of the dorsum of the wrist and hand. Limited flexion and extension. Normal abduction and extension of the thumb. Normal elbow range of motion. Normal  finger abduction and adduction.  Neurovascularly intact     ASSESSMENT & PLAN:   Closed fracture of right distal radius Injury occurred at work on 3/22.  Independent review x-ray from today showing a nondisplaced distal radius fracture. -Placed in Exos. -Counseled supportive care. -Follow-up in 3 weeks. -Provided work note.

## 2020-02-08 ENCOUNTER — Encounter: Payer: Worker's Compensation | Admitting: Family Medicine

## 2020-02-08 NOTE — Progress Notes (Deleted)
  Melanie Moreno - 43 y.o. female MRN [id number]  Date of birth: 09/02/1976  SUBJECTIVE:  Including CC & ROS.  No chief complaint on file.   Melanie Moreno is a 43 y.o. female that is  ***.  ***   Review of Systems See HPI   HISTORY: Past Medical, Surgical, Social, and Family History Reviewed & Updated per EMR.   Pertinent Historical Findings include:  Past Medical History:  Diagnosis Date  . Anemia   . Hypertension   . Irregular heart rate   . Ovarian cyst     Past Surgical History:  Procedure Laterality Date  . APPENDECTOMY    . BREAST ENHANCEMENT SURGERY      No family history on file.  Social History   Socioeconomic History  . Marital status: Single    Spouse name: Not on file  . Number of children: Not on file  . Years of education: Not on file  . Highest education level: Not on file  Occupational History  . Not on file  Tobacco Use  . Smoking status: Current Every Day Smoker    Packs/day: 1.00    Types: Cigarettes  . Smokeless tobacco: Never Used  Substance and Sexual Activity  . Alcohol use: Yes    Comment: occassional  . Drug use: Never  . Sexual activity: Yes  Other Topics Concern  . Not on file  Social History Narrative  . Not on file   Social Determinants of Health   Financial Resource Strain:   . Difficulty of Paying Living Expenses:   Food Insecurity:   . Worried About Running Out of Food in the Last Year:   . Ran Out of Food in the Last Year:   Transportation Needs:   . Lack of Transportation (Medical):   . Lack of Transportation (Non-Medical):   Physical Activity:   . Days of Exercise per Week:   . Minutes of Exercise per Session:   Stress:   . Feeling of Stress :   Social Connections:   . Frequency of Communication with Friends and Family:   . Frequency of Social Gatherings with Friends and Family:   . Attends Religious Services:   . Active Member of Clubs or Organizations:   . Attends Club or Organization Meetings:   . Marital  Status:   Intimate Partner Violence:   . Fear of Current or Ex-Partner:   . Emotionally Abused:   . Physically Abused:   . Sexually Abused:      PHYSICAL EXAM:  VS: There were no vitals taken for this visit. Physical Exam Gen: NAD, alert, cooperative with exam, well-appearing MSK:  ***      ASSESSMENT & PLAN:   No problem-specific Assessment & Plan notes found for this encounter.     

## 2020-02-17 ENCOUNTER — Ambulatory Visit (HOSPITAL_BASED_OUTPATIENT_CLINIC_OR_DEPARTMENT_OTHER)
Admission: RE | Admit: 2020-02-17 | Discharge: 2020-02-17 | Disposition: A | Discharge status: Home / Self Care | Payer: Worker's Compensation | Source: Ambulatory Visit | Attending: Family Medicine | Admitting: Family Medicine

## 2020-02-17 ENCOUNTER — Encounter: Payer: Self-pay | Admitting: Family Medicine

## 2020-02-17 ENCOUNTER — Ambulatory Visit (INDEPENDENT_AMBULATORY_CARE_PROVIDER_SITE_OTHER): Payer: Worker's Compensation | Admitting: Family Medicine

## 2020-02-17 ENCOUNTER — Other Ambulatory Visit: Payer: Self-pay

## 2020-02-17 VITALS — BP 162/113 | HR 81 | Ht 64.0 in | Wt 115.0 lb

## 2020-02-17 DIAGNOSIS — S52591A Other fractures of lower end of right radius, initial encounter for closed fracture: Secondary | ICD-10-CM | POA: Insufficient documentation

## 2020-02-17 DIAGNOSIS — S52591D Other fractures of lower end of right radius, subsequent encounter for closed fracture with routine healing: Secondary | ICD-10-CM | POA: Diagnosis not present

## 2020-02-17 NOTE — Patient Instructions (Signed)
Good to see you Please use the wrist brace when active  Please try the range of motion movements  Please try ice as needed   Please send me a message in MyChart with any questions or updates.  Please see me back in 4 weeks.   --Dr. Jordan Likes

## 2020-02-17 NOTE — Progress Notes (Signed)
  Melanie Moreno - 44 y.o. female MRN [id number]  Date of birth: Jul 29, 1976  SUBJECTIVE:  Including CC & ROS.  Chief Complaint  Patient presents with  . Follow-up    follow up for right wrist    Melanie Moreno is a 44 y.o. female that is following up for her right distal radius fracture.  Her swelling and ecchymosis of improved.  She has some loss of range of motion.  No significant pain.   Review of Systems See HPI   HISTORY: Past Medical, Surgical, Social, and Family History Reviewed & Updated per EMR.   Pertinent Historical Findings include:  Past Medical History:  Diagnosis Date  . Anemia   . Hypertension   . Irregular heart rate   . Ovarian cyst     Past Surgical History:  Procedure Laterality Date  . APPENDECTOMY    . BREAST ENHANCEMENT SURGERY      No family history on file.  Social History   Socioeconomic History  . Marital status: Single    Spouse name: Not on file  . Number of children: Not on file  . Years of education: Not on file  . Highest education level: Not on file  Occupational History  . Not on file  Tobacco Use  . Smoking status: Current Every Day Smoker    Packs/day: 1.00    Types: Cigarettes  . Smokeless tobacco: Never Used  Substance and Sexual Activity  . Alcohol use: Yes    Comment: occassional  . Drug use: Never  . Sexual activity: Yes  Other Topics Concern  . Not on file  Social History Narrative  . Not on file   Social Determinants of Health   Financial Resource Strain:   . Difficulty of Paying Living Expenses:   Food Insecurity:   . Worried About Programme researcher, broadcasting/film/video in the Last Year:   . Barista in the Last Year:   Transportation Needs:   . Freight forwarder (Medical):   Marland Kitchen Lack of Transportation (Non-Medical):   Physical Activity:   . Days of Exercise per Week:   . Minutes of Exercise per Session:   Stress:   . Feeling of Stress :   Social Connections:   . Frequency of Communication with Friends and Family:    . Frequency of Social Gatherings with Friends and Family:   . Attends Religious Services:   . Active Member of Clubs or Organizations:   . Attends Banker Meetings:   Marland Kitchen Marital Status:   Intimate Partner Violence:   . Fear of Current or Ex-Partner:   . Emotionally Abused:   Marland Kitchen Physically Abused:   . Sexually Abused:      PHYSICAL EXAM:  VS: BP (!) 162/113   Pulse 81   Ht 5\' 4"  (1.626 m)   Wt 115 lb (52.2 kg)   BMI 19.74 kg/m  Physical Exam Gen: NAD, alert, cooperative with exam, well-appearing MSK:  Right wrist: No ecchymosis or swelling. Limitations in flexion extension. Normal thumb abduction. Normal grip strength. Neurovascularly intact     ASSESSMENT & PLAN:   Closed fracture of right distal radius Injury occurred at work on 3/22.  Improvement noted with ecchymosis and swelling. -Provided wrist splint. -Counseled on home exercise therapy and supportive cares. -X-ray. -Provided work note. -Follow-up in 4 weeks

## 2020-02-17 NOTE — Assessment & Plan Note (Signed)
Injury occurred at work on 3/22.  Improvement noted with ecchymosis and swelling. -Provided wrist splint. -Counseled on home exercise therapy and supportive cares. -X-ray. -Provided work note. -Follow-up in 4 weeks

## 2020-02-18 ENCOUNTER — Telehealth: Payer: Self-pay | Admitting: Family Medicine

## 2020-02-18 NOTE — Telephone Encounter (Signed)
Left VM for patient. If she calls back please have her speak with a nurse/CMA and inform that her xray is showing healing of the fracture.   If any questions then please take the best time and phone number to call and I will try to call her back.   Myra Rude, MD [facility] 02/18/2020, 9:27 AM

## 2020-03-17 ENCOUNTER — Encounter: Payer: Worker's Compensation | Admitting: Family Medicine

## 2020-03-17 NOTE — Progress Notes (Deleted)
  Melanie Moreno - 43 y.o. female MRN [id number]  Date of birth: 08/25/1976  SUBJECTIVE:  Including CC & ROS.  No chief complaint on file.   Melanie Moreno is a 43 y.o. female that is  ***.  ***   Review of Systems See HPI   HISTORY: Past Medical, Surgical, Social, and Family History Reviewed & Updated per EMR.   Pertinent Historical Findings include:  Past Medical History:  Diagnosis Date  . Anemia   . Hypertension   . Irregular heart rate   . Ovarian cyst     Past Surgical History:  Procedure Laterality Date  . APPENDECTOMY    . BREAST ENHANCEMENT SURGERY      No family history on file.  Social History   Socioeconomic History  . Marital status: Single    Spouse name: Not on file  . Number of children: Not on file  . Years of education: Not on file  . Highest education level: Not on file  Occupational History  . Not on file  Tobacco Use  . Smoking status: Current Every Day Smoker    Packs/day: 1.00    Types: Cigarettes  . Smokeless tobacco: Never Used  Substance and Sexual Activity  . Alcohol use: Yes    Comment: occassional  . Drug use: Never  . Sexual activity: Yes  Other Topics Concern  . Not on file  Social History Narrative  . Not on file   Social Determinants of Health   Financial Resource Strain:   . Difficulty of Paying Living Expenses:   Food Insecurity:   . Worried About Running Out of Food in the Last Year:   . Ran Out of Food in the Last Year:   Transportation Needs:   . Lack of Transportation (Medical):   . Lack of Transportation (Non-Medical):   Physical Activity:   . Days of Exercise per Week:   . Minutes of Exercise per Session:   Stress:   . Feeling of Stress :   Social Connections:   . Frequency of Communication with Friends and Family:   . Frequency of Social Gatherings with Friends and Family:   . Attends Religious Services:   . Active Member of Clubs or Organizations:   . Attends Club or Organization Meetings:   . Marital  Status:   Intimate Partner Violence:   . Fear of Current or Ex-Partner:   . Emotionally Abused:   . Physically Abused:   . Sexually Abused:      PHYSICAL EXAM:  VS: There were no vitals taken for this visit. Physical Exam Gen: NAD, alert, cooperative with exam, well-appearing MSK:  ***      ASSESSMENT & PLAN:   No problem-specific Assessment & Plan notes found for this encounter.     

## 2020-03-31 ENCOUNTER — Encounter: Payer: Worker's Compensation | Admitting: Family Medicine

## 2020-03-31 NOTE — Progress Notes (Deleted)
  Melanie Moreno - 44 y.o. female MRN [id number]  Date of birth: 09-19-1976  SUBJECTIVE:  Including CC & ROS.  No chief complaint on file.   Melanie Moreno is a 44 y.o. female that is  ***.  ***   Review of Systems See HPI   HISTORY: Past Medical, Surgical, Social, and Family History Reviewed & Updated per EMR.   Pertinent Historical Findings include:  Past Medical History:  Diagnosis Date  . Anemia   . Hypertension   . Irregular heart rate   . Ovarian cyst     Past Surgical History:  Procedure Laterality Date  . APPENDECTOMY    . BREAST ENHANCEMENT SURGERY      No family history on file.  Social History   Socioeconomic History  . Marital status: Single    Spouse name: Not on file  . Number of children: Not on file  . Years of education: Not on file  . Highest education level: Not on file  Occupational History  . Not on file  Tobacco Use  . Smoking status: Current Every Day Smoker    Packs/day: 1.00    Types: Cigarettes  . Smokeless tobacco: Never Used  Substance and Sexual Activity  . Alcohol use: Yes    Comment: occassional  . Drug use: Never  . Sexual activity: Yes  Other Topics Concern  . Not on file  Social History Narrative  . Not on file   Social Determinants of Health   Financial Resource Strain:   . Difficulty of Paying Living Expenses:   Food Insecurity:   . Worried About Programme researcher, broadcasting/film/video in the Last Year:   . Barista in the Last Year:   Transportation Needs:   . Freight forwarder (Medical):   Marland Kitchen Lack of Transportation (Non-Medical):   Physical Activity:   . Days of Exercise per Week:   . Minutes of Exercise per Session:   Stress:   . Feeling of Stress :   Social Connections:   . Frequency of Communication with Friends and Family:   . Frequency of Social Gatherings with Friends and Family:   . Attends Religious Services:   . Active Member of Clubs or Organizations:   . Attends Banker Meetings:   Marland Kitchen Marital  Status:   Intimate Partner Violence:   . Fear of Current or Ex-Partner:   . Emotionally Abused:   Marland Kitchen Physically Abused:   . Sexually Abused:      PHYSICAL EXAM:  VS: There were no vitals taken for this visit. Physical Exam Gen: NAD, alert, cooperative with exam, well-appearing MSK:  ***      ASSESSMENT & PLAN:   No problem-specific Assessment & Plan notes found for this encounter.

## 2020-06-05 ENCOUNTER — Emergency Department (HOSPITAL_BASED_OUTPATIENT_CLINIC_OR_DEPARTMENT_OTHER)
Admission: EM | Admit: 2020-06-05 | Discharge: 2020-06-05 | Disposition: A | Discharge status: Home / Self Care | Payer: Self-pay | Attending: Emergency Medicine | Admitting: Emergency Medicine

## 2020-06-05 ENCOUNTER — Encounter (HOSPITAL_BASED_OUTPATIENT_CLINIC_OR_DEPARTMENT_OTHER): Payer: Self-pay | Admitting: Emergency Medicine

## 2020-06-05 ENCOUNTER — Emergency Department (HOSPITAL_BASED_OUTPATIENT_CLINIC_OR_DEPARTMENT_OTHER): Payer: Self-pay

## 2020-06-05 ENCOUNTER — Other Ambulatory Visit: Payer: Self-pay

## 2020-06-05 DIAGNOSIS — F17210 Nicotine dependence, cigarettes, uncomplicated: Secondary | ICD-10-CM | POA: Insufficient documentation

## 2020-06-05 DIAGNOSIS — R1011 Right upper quadrant pain: Secondary | ICD-10-CM

## 2020-06-05 DIAGNOSIS — K2971 Gastritis, unspecified, with bleeding: Secondary | ICD-10-CM | POA: Insufficient documentation

## 2020-06-05 DIAGNOSIS — K2900 Acute gastritis without bleeding: Secondary | ICD-10-CM

## 2020-06-05 DIAGNOSIS — I10 Essential (primary) hypertension: Secondary | ICD-10-CM | POA: Insufficient documentation

## 2020-06-05 LAB — COMPREHENSIVE METABOLIC PANEL
ALT: 107 U/L — ABNORMAL HIGH (ref 0–44)
AST: 135 U/L — ABNORMAL HIGH (ref 15–41)
Albumin: 4 g/dL (ref 3.5–5.0)
Alkaline Phosphatase: 58 U/L (ref 38–126)
Anion gap: 14 (ref 5–15)
BUN: 10 mg/dL (ref 6–20)
CO2: 22 mmol/L (ref 22–32)
Calcium: 9.7 mg/dL (ref 8.9–10.3)
Chloride: 99 mmol/L (ref 98–111)
Creatinine, Ser: 0.63 mg/dL (ref 0.44–1.00)
GFR calc Af Amer: >60 mL/min (ref >60)
GFR calc non Af Amer: >60 mL/min (ref >60)
Glucose, Bld: 89 mg/dL (ref 70–99)
Potassium: 4.4 mmol/L (ref 3.5–5.1)
Sodium: 135 mmol/L (ref 135–145)
Total Bilirubin: 1.4 mg/dL — ABNORMAL HIGH (ref 0.3–1.2)
Total Protein: 7.2 g/dL (ref 6.5–8.1)

## 2020-06-05 LAB — URINALYSIS, ROUTINE W REFLEX MICROSCOPIC
Glucose, UA: NEGATIVE mg/dL
Hgb urine dipstick: NEGATIVE
Ketones, ur: 40 mg/dL — AB
Nitrite: POSITIVE — AB
Protein, ur: NEGATIVE mg/dL
Specific Gravity, Urine: 1.025 (ref 1.005–1.030)
pH: 5.5 (ref 5.0–8.0)

## 2020-06-05 LAB — URINALYSIS, MICROSCOPIC (REFLEX): RBC / HPF: NONE SEEN RBC/hpf (ref 0–5)

## 2020-06-05 LAB — CBC
HCT: 42.5 % (ref 36.0–46.0)
Hemoglobin: 14.7 g/dL (ref 12.0–15.0)
MCH: 36.1 pg — ABNORMAL HIGH (ref 26.0–34.0)
MCHC: 34.6 g/dL (ref 30.0–36.0)
MCV: 104.4 fL — ABNORMAL HIGH (ref 80.0–100.0)
Platelets: 108 10*3/uL — ABNORMAL LOW (ref 150–400)
RBC: 4.07 MIL/uL (ref 3.87–5.11)
RDW: 13.2 % (ref 11.5–15.5)
WBC: 7.5 10*3/uL (ref 4.0–10.5)
nRBC: 0 % (ref 0.0–0.2)

## 2020-06-05 LAB — LIPASE, BLOOD: Lipase: 27 U/L (ref 11–51)

## 2020-06-05 LAB — PREGNANCY, URINE: Preg Test, Ur: NEGATIVE

## 2020-06-05 MED ORDER — ONDANSETRON HCL 4 MG/2ML IJ SOLN
4.0000 mg | Freq: Once | INTRAMUSCULAR | Status: AC | PRN
  Administered 2020-06-05: 4 mg via INTRAVENOUS
  Filled 2020-06-05: qty 2

## 2020-06-05 MED ORDER — OMEPRAZOLE 20 MG PO CPDR: 20.0000 mg | DELAYED_RELEASE_CAPSULE | Freq: Two times a day (BID) | ORAL | 0 refills | Status: DC

## 2020-06-05 MED ORDER — FENTANYL CITRATE (PF) 100 MCG/2ML IJ SOLN
50.0000 ug | Freq: Once | INTRAMUSCULAR | Status: AC
  Administered 2020-06-05: 50 ug via INTRAVENOUS
  Filled 2020-06-05: qty 2

## 2020-06-05 MED ORDER — SODIUM CHLORIDE 0.9 % IV BOLUS
1000.0000 mL | Freq: Once | INTRAVENOUS | Status: AC
  Administered 2020-06-05: 1000 mL via INTRAVENOUS

## 2020-06-05 MED ORDER — PANTOPRAZOLE SODIUM 40 MG IV SOLR
40.0000 mg | Freq: Once | INTRAVENOUS | Status: AC
  Administered 2020-06-05: 40 mg via INTRAVENOUS
  Filled 2020-06-05: qty 40

## 2020-06-05 MED ORDER — SODIUM CHLORIDE 0.9 % IV SOLN
1.0000 g | Freq: Once | INTRAVENOUS | Status: DC
  Filled 2020-06-05: qty 10

## 2020-06-05 MED ORDER — MORPHINE SULFATE (PF) 4 MG/ML IV SOLN
4.0000 mg | Freq: Once | INTRAVENOUS | Status: AC
  Administered 2020-06-05: 4 mg via INTRAVENOUS
  Filled 2020-06-05: qty 1

## 2020-06-05 MED ORDER — SODIUM CHLORIDE 0.9 % IV SOLN: INTRAVENOUS | Status: DC | PRN

## 2020-06-05 MED ORDER — ONDANSETRON HCL 4 MG/2ML IJ SOLN
4.0000 mg | Freq: Once | INTRAMUSCULAR | Status: AC
  Administered 2020-06-05: 4 mg via INTRAVENOUS
  Filled 2020-06-05: qty 2

## 2020-06-05 MED ORDER — ALUM & MAG HYDROXIDE-SIMETH 200-200-20 MG/5ML PO SUSP
30.0000 mL | Freq: Once | ORAL | Status: AC
  Administered 2020-06-05: 30 mL via ORAL
  Filled 2020-06-05: qty 30

## 2020-06-05 MED ORDER — SUCRALFATE 1 G PO TABS: 1.0000 g | ORAL_TABLET | Freq: Three times a day (TID) | ORAL | 0 refills | Status: DC

## 2020-06-05 MED ORDER — IOHEXOL 300 MG/ML  SOLN
100.0000 mL | Freq: Once | INTRAMUSCULAR | Status: AC | PRN
  Administered 2020-06-05: 100 mL via INTRAVENOUS

## 2020-06-05 NOTE — ED Provider Notes (Addendum)
[facility] EMERGENCY DEPARTMENT Provider Note   CSN: [id number] Arrival date & time: 06/05/20  1749     History Chief Complaint  Patient presents with  . Abdominal Pain  . Chest Pain    Melanie Moreno is a 44 y.o. female.  The history is provided by the patient. No language interpreter was used.  Abdominal Pain Associated symptoms: chest pain   Chest Pain Associated symptoms: abdominal pain      44 year old female significant history of hypertension, anemia, ovarian cyst, presenting for complaint of abdominal pain.  Patient reports since yesterday she has been experiencing gradual onset of pain about her abdomen.  Pain is localized to her epigastric region, sharp, burning, stabbing, radiates towards the back and have been waxing waning.  Currently rates pain as 8 out of 10.  She also endorsed nausea, and has vomited multiple episodes of yellow emesis.  She does endorse some lightheadedness.  She does not pain any fever chills no pleuritic chest pain, shortness of breath, productive cough, dysuria, hematuria, vaginal bleeding, vaginal discharge, bowel bladder changes.  She admits to recent alcohol use 2 days ago.  She denies drug use.  She mention she is currently premenopausal.  She denies any prior history of pancreatitis or hepatitis.  She denies any black tarry stool.  Past Medical History:  Diagnosis Date  . Anemia   . Hypertension   . Irregular heart rate   . Ovarian cyst     Patient Active Problem List   Diagnosis Date Noted  . Closed fracture of right distal radius 01/18/2020    Past Surgical History:  Procedure Laterality Date  . APPENDECTOMY    . BREAST ENHANCEMENT SURGERY       OB History   No obstetric history on file.     No family history on file.  Social History   Tobacco Use  . Smoking status: Current Every Day Smoker    Packs/day: 1.00    Types: Cigarettes  . Smokeless tobacco: Never Used  Substance Use Topics  . Alcohol use: Yes     Comment: occassional  . Drug use: Never    Home Medications Prior to Admission medications   Medication Sig Start Date End Date Taking? Authorizing Provider  cyclobenzaprine (FLEXERIL) 10 MG tablet Take 1 tablet (10 mg total) by mouth 2 (two) times daily as needed for muscle spasms. 09/26/19   Fayrene Helper, PA-C  ibuprofen (ADVIL) 600 MG tablet Take 1 tablet (600 mg total) by mouth every 6 (six) hours as needed. 09/26/19   Fayrene Helper, PA-C  metoprolol tartrate (LOPRESSOR) 25 MG tablet Take 25 mg by mouth 2 (two) times daily.    [provider]    Allergies    Dilaudid [hydromorphone hcl]  Review of Systems   Review of Systems  Cardiovascular: Positive for chest pain.  Gastrointestinal: Positive for abdominal pain.  All other systems reviewed and are negative.   Physical Exam Updated Vital Signs BP (!) 156/116 (BP Location: Right Arm)   Pulse 81   Temp 99.3 F (37.4 C)   Resp 16   Ht 5\' 4"  (1.626 m)   Wt 52.2 kg   SpO2 100%   BMI 19.74 kg/m   Physical Exam Vitals and nursing note reviewed.  Constitutional:      General: She is not in acute distress.    Appearance: She is well-developed.  HENT:     Head: Atraumatic.  Eyes:     Conjunctiva/sclera: Conjunctivae normal.  Cardiovascular:     Rate and Rhythm: Normal rate and regular rhythm.     Pulses: Normal pulses.     Heart sounds: Normal heart sounds.  Pulmonary:     Effort: Pulmonary effort is normal.     Breath sounds: Normal breath sounds.  Abdominal:     General: Abdomen is flat.     Palpations: Abdomen is soft.     Tenderness: There is abdominal tenderness (Tenderness to epigastric region and right upper quadrant without guarding or rebound tenderness.  No overlying skin changes.).  Musculoskeletal:     Cervical back: Neck supple.  Skin:    Findings: No rash.  Neurological:     Mental Status: She is alert and oriented to person, place, and time.  Psychiatric:        Mood and Affect: Mood normal.      ED Results / Procedures / Treatments   Labs (all labs ordered are listed, but only abnormal results are displayed) Labs Reviewed  COMPREHENSIVE METABOLIC PANEL - Abnormal; Notable for the following components:      Result Value   AST 135 (*)    ALT 107 (*)    Total Bilirubin 1.4 (*)    All other components within normal limits  CBC - Abnormal; Notable for the following components:   MCV 104.4 (*)    MCH 36.1 (*)    Platelets 108 (*)    All other components within normal limits  URINALYSIS, ROUTINE W REFLEX MICROSCOPIC - Abnormal; Notable for the following components:   Color, Urine BROWN (*)    APPearance CLOUDY (*)    Bilirubin Urine MODERATE (*)    Ketones, ur 40 (*)    Nitrite POSITIVE (*)    Leukocytes,Ua TRACE (*)    All other components within normal limits  URINALYSIS, MICROSCOPIC (REFLEX) - Abnormal; Notable for the following components:   Bacteria, UA RARE (*)    All other components within normal limits  URINE CULTURE  LIPASE, BLOOD  PREGNANCY, URINE  HEPATITIS PANEL, ACUTE    EKG EKG Interpretation  Date/Time:  Sunday June 05 2020 17:54:06 EDT Ventricular Rate:  92 PR Interval:  138 QRS Duration: 74 QT Interval:  352 QTC Calculation: 435 R Axis:   78 Text Interpretation: Normal sinus rhythm Normal ECG No significant change since last tracing Confirmed by Susy Frizzle [id number]) on 06/05/2020 6:15:11 PM   Radiology CT ABDOMEN PELVIS W CONTRAST  Result Date: 06/05/2020 CLINICAL DATA:  Right upper quadrant and epigastric pain EXAM: CT ABDOMEN AND PELVIS WITH CONTRAST TECHNIQUE: Multidetector CT imaging of the abdomen and pelvis was performed using the standard protocol following bolus administration of intravenous contrast. CONTRAST:  OMNIPAQUE IOHEXOL 300 MG/ML  SOLN COMPARISON:  None. FINDINGS: Lower chest: The visualized heart size within normal limits. No pericardial fluid/thickening. No hiatal hernia. The visualized portions of the lungs  are clear. Hepatobiliary: There is diffuse low density seen throughout the liver parenchyma. Main portal vein is patent. No evidence of calcified gallstones, gallbladder wall thickening or biliary dilatation. Pancreas: Unremarkable. No pancreatic ductal dilatation or surrounding inflammatory changes. Spleen: Normal in size without focal abnormality. Adrenals/Urinary Tract: Both adrenal glands appear normal. The kidneys and collecting system appear normal without evidence of urinary tract calculus or hydronephrosis. Bladder is unremarkable. Stomach/Bowel: There is circumferential wall thickening seen around the distal gastric body. Minimal surrounding fat stranding changes are seen. No free air or loculated fluid collections are noted. The remainder of the small bowel and  colon are normal in appearance. The patient is status post appendectomy. Vascular/Lymphatic: There are no enlarged mesenteric, retroperitoneal, or pelvic lymph nodes. No significant vascular findings are present. Reproductive: The uterus and adnexa are unremarkable. Other: No evidence of abdominal wall mass or hernia. Musculoskeletal: No acute or significant osseous findings. IMPRESSION: Findings suggestive of diffuse gastritis involving the distal stomach. No surrounding loculated fluid collections or free air. Would recommend direct visualization upon resolution of symptoms. Hepatic steatosis Electronically Signed   By: Jonna ClarkBindu  Avutu M.D.   On: 06/05/2020 23:06   US Abdomen Limited RUQ  Result Date: 06/05/2020 CLINICAL DATA:  Epigastric pain for 2 days. EXAM: ULTRASOUND ABDOMEN LIMITED RIGHT UPPER QUADRANT COMPARISON:  CT abdomen pelvis dated 05/17/2018. FINDINGS: Gallbladder: No gallstones or wall thickening visualized. No sonographic Murphy sign noted by sonographer. Common bile duct: Diameter: 2 mm Liver: No focal lesion identified. Increased parenchymal echogenicity. Portal vein is patent on color Doppler imaging with normal direction of  blood flow towards the liver. Other: None. IMPRESSION: 1. No evidence of acute cholecystitis. 2. Increased parenchymal echogenicity of the liver is nonspecific but can be seen with hepatic steatosis. Electronically Signed   By: Romona Curlsyler  Litton M.D.   On: 06/05/2020 18:37    Procedures Procedures (including critical care time)  Medications Ordered in ED Medications  0.9 %  sodium chloride infusion ( Intravenous Stopped 06/05/20 2159)  alum & mag hydroxide-simeth (MAALOX/MYLANTA) 200-200-20 MG/5ML suspension 30 mL (has no administration in time range)  ondansetron (ZOFRAN) injection 4 mg (4 mg Intravenous Given 06/05/20 1810)  morphine 4 MG/ML injection 4 mg (4 mg Intravenous Given 06/05/20 2105)  ondansetron (ZOFRAN) injection 4 mg (4 mg Intravenous Given 06/05/20 2105)  sodium chloride 0.9 % bolus 1,000 mL (1,000 mLs Intravenous New Bag/Given 06/05/20 2103)  pantoprazole (PROTONIX) injection 40 mg (40 mg Intravenous Given 06/05/20 2104)  morphine 4 MG/ML injection 4 mg (4 mg Intravenous Given 06/05/20 2136)  iohexol (OMNIPAQUE) 300 MG/ML solution 100 mL (100 mLs Intravenous Contrast Given 06/05/20 2243)  fentaNYL (SUBLIMAZE) injection 50 mcg (50 mcg Intravenous Given 06/05/20 2310)    ED Course  I have reviewed the triage vital signs and the nursing notes.  Pertinent labs & imaging results that were available during my care of the patient were reviewed by me and considered in my medical decision making (see chart for details).    MDM Rules/Calculators/A&P                          BP (!) 160/99 (BP Location: Right Arm)   Pulse 73   Temp 99.3 F (37.4 C)   Resp 18   Ht 5\' 4"  (1.626 m)   Wt 52.2 kg   SpO2 100%   BMI 19.74 kg/m   Final Clinical Impression(s) / ED Diagnoses Final diagnoses:  Other acute gastritis, presence of bleeding unspecified    Rx / DC Orders ED Discharge Orders         Ordered    sucralfate (CARAFATE) 1 g tablet  3 times daily with meals & bedtime      Discontinue  Reprint     06/05/20 2311    omeprazole (PRILOSEC) 20 MG capsule  2 times daily before meals     Discontinue  Reprint     06/05/20 2311         8:52 PM Patient here with epigastric tenderness.  She does admits to alcohol use as well as regular use  of [employer] powders.  I suspect this may contribute to potential gastric ulcer causing her discomfort.  Labs remarkable for transaminitis with AST 135, ALT 107, total bili of 1.4.  UA shows nitrite positive but with trace leukocyte esterase and rare bacteria.  I am not convinced that she has a urinary tract infection causing her symptoms.  An ultrasound obtained show no acute finding.  Will obtain a hepatitis panel, and obtain an abdominal pelvis CT scan to ensure this is not a perforated gastric ulcer.  Protonix given as well as pain medication.  IV fluid given. Pt denies tylenol use.   11:01 PM CT scan of the abdomen pelvis demonstrate finding suggestive of diffuse gastritis involving the distal stomach.  No surrounding loculated fluid collections or free air.  Radiologist recommend direct visualization upon resolution of symptoms.  Plan to have patient follow-up with GI specialist for further care.  Patient discharged home with PPI.  GI cocktail given.  Return precaution discussed.  Care discussed with Dr. Bernette Mayers.   Urine culture sent, doubt UTI.  Hepatitis panel is currently pending however I suspect transaminitis likely 2/2 alcohol use.  Encourage pt to f/u her hepatitis labs via MyChart.     Fayrene Helper, PA-C 06/05/20 2317    Fayrene Helper, PA-C 06/05/20 2318    Pollyann Savoy, MD 06/05/20 [phone]

## 2020-06-05 NOTE — ED Triage Notes (Signed)
Reports RUQ pain that radiates into center of chest and feels like a pinched nerve in center of her back.  Endorses vomiting all day yesterday.  Denies any diarrhea.

## 2020-06-05 NOTE — ED Notes (Signed)
Pt did not provide enough urine to complete urine culture prior to leaving department.

## 2020-06-05 NOTE — ED Notes (Signed)
Pt requesting pain medications for her abd pain. Returned from CT

## 2020-06-05 NOTE — ED Notes (Signed)
Called lab to add on urine cx. Lab requesting another sample due to volume.

## 2020-06-05 NOTE — ED Notes (Signed)
Patient reporting worsening pain. EDP made aware, see new orders.

## 2020-06-05 NOTE — Discharge Instructions (Signed)
You have been evaluated for your symptoms.  Finding is consistent with gastritis.  This is likely due to your regular anti-inflammatory medication use as well as alcohol use.  Please avoid these as it may cause worsening of your symptoms.  Take medication prescribed.  Call and follow-up closely with GI specialist for recheck within a week.

## 2020-06-06 LAB — URINE CULTURE

## 2020-06-06 LAB — HEPATITIS PANEL, ACUTE
HCV Ab: NONREACTIVE
Hep A IgM: NONREACTIVE
Hep B C IgM: NONREACTIVE
Hepatitis B Surface Ag: NONREACTIVE

## 2020-08-29 ENCOUNTER — Emergency Department (HOSPITAL_BASED_OUTPATIENT_CLINIC_OR_DEPARTMENT_OTHER): Payer: Medicaid Other

## 2020-08-29 ENCOUNTER — Emergency Department (HOSPITAL_BASED_OUTPATIENT_CLINIC_OR_DEPARTMENT_OTHER)
Admission: EM | Admit: 2020-08-29 | Discharge: 2020-08-29 | Disposition: A | Discharge status: Home / Self Care | Payer: Medicaid Other | Attending: Emergency Medicine | Admitting: Emergency Medicine

## 2020-08-29 ENCOUNTER — Other Ambulatory Visit: Payer: Self-pay

## 2020-08-29 ENCOUNTER — Encounter (HOSPITAL_BASED_OUTPATIENT_CLINIC_OR_DEPARTMENT_OTHER): Payer: Self-pay | Admitting: *Deleted

## 2020-08-29 DIAGNOSIS — R509 Fever, unspecified: Secondary | ICD-10-CM | POA: Insufficient documentation

## 2020-08-29 DIAGNOSIS — F17210 Nicotine dependence, cigarettes, uncomplicated: Secondary | ICD-10-CM | POA: Insufficient documentation

## 2020-08-29 DIAGNOSIS — I10 Essential (primary) hypertension: Secondary | ICD-10-CM | POA: Insufficient documentation

## 2020-08-29 DIAGNOSIS — R110 Nausea: Secondary | ICD-10-CM | POA: Insufficient documentation

## 2020-08-29 DIAGNOSIS — Z79899 Other long term (current) drug therapy: Secondary | ICD-10-CM | POA: Insufficient documentation

## 2020-08-29 DIAGNOSIS — Z20822 Contact with and (suspected) exposure to covid-19: Secondary | ICD-10-CM | POA: Insufficient documentation

## 2020-08-29 LAB — CBC
HCT: 43.4 % (ref 36.0–46.0)
Hemoglobin: 15 g/dL (ref 12.0–15.0)
MCH: 35.9 pg — ABNORMAL HIGH (ref 26.0–34.0)
MCHC: 34.6 g/dL (ref 30.0–36.0)
MCV: 103.8 fL — ABNORMAL HIGH (ref 80.0–100.0)
Platelets: 163 10*3/uL (ref 150–400)
RBC: 4.18 MIL/uL (ref 3.87–5.11)
RDW: 13.3 % (ref 11.5–15.5)
WBC: 8.9 10*3/uL (ref 4.0–10.5)
nRBC: 0 % (ref 0.0–0.2)

## 2020-08-29 LAB — BASIC METABOLIC PANEL
Anion gap: 20 — ABNORMAL HIGH (ref 5–15)
BUN: 10 mg/dL (ref 6–20)
CO2: 20 mmol/L — ABNORMAL LOW (ref 22–32)
Calcium: 9.8 mg/dL (ref 8.9–10.3)
Chloride: 95 mmol/L — ABNORMAL LOW (ref 98–111)
Creatinine, Ser: 0.67 mg/dL (ref 0.44–1.00)
GFR, Estimated: >60 mL/min (ref >60)
Glucose, Bld: 73 mg/dL (ref 70–99)
Potassium: 3.6 mmol/L (ref 3.5–5.1)
Sodium: 135 mmol/L (ref 135–145)

## 2020-08-29 LAB — RESPIRATORY PANEL BY RT PCR (FLU A&B, COVID)
Influenza A by PCR: NEGATIVE
Influenza B by PCR: NEGATIVE
SARS Coronavirus 2 by RT PCR: NEGATIVE

## 2020-08-29 LAB — TROPONIN I (HIGH SENSITIVITY)
Troponin I (High Sensitivity): 7 ng/L (ref <18)
Troponin I (High Sensitivity): 9 ng/L (ref <18)

## 2020-08-29 MED ORDER — METOPROLOL TARTRATE 50 MG PO TABS: 50.0000 mg | ORAL_TABLET | Freq: Two times a day (BID) | ORAL | 3 refills | Status: DC

## 2020-08-29 MED ORDER — LORAZEPAM 2 MG/ML IJ SOLN
1.0000 mg | Freq: Once | INTRAMUSCULAR | Status: AC
  Administered 2020-08-29: 1 mg via INTRAVENOUS
  Filled 2020-08-29: qty 1

## 2020-08-29 MED ORDER — KETOROLAC TROMETHAMINE 15 MG/ML IJ SOLN
15.0000 mg | Freq: Once | INTRAMUSCULAR | Status: AC
  Administered 2020-08-29: 15 mg via INTRAVENOUS
  Filled 2020-08-29: qty 1

## 2020-08-29 MED ORDER — SODIUM CHLORIDE 0.9 % IV BOLUS
1000.0000 mL | Freq: Once | INTRAVENOUS | Status: AC
  Administered 2020-08-29: 1000 mL via INTRAVENOUS

## 2020-08-29 MED ORDER — METOPROLOL TARTRATE 25 MG PO TABS
25.0000 mg | ORAL_TABLET | Freq: Two times a day (BID) | ORAL | Status: DC
  Administered 2020-08-29: 25 mg via ORAL
  Filled 2020-08-29: qty 1

## 2020-08-29 MED ORDER — ONDANSETRON HCL 4 MG/2ML IJ SOLN
4.0000 mg | Freq: Once | INTRAMUSCULAR | Status: AC
  Administered 2020-08-29: 4 mg via INTRAVENOUS
  Filled 2020-08-29: qty 2

## 2020-08-29 NOTE — ED Triage Notes (Addendum)
C/o mid sternal chest pain x 1 day , pt states w/o  bp meds , also c/o palpations , SOB and nausea

## 2020-08-29 NOTE — ED Provider Notes (Signed)
[facility] EMERGENCY DEPARTMENT Provider Note   CSN: [id number] Arrival date & time: 08/29/20  1827     History Chief Complaint  Patient presents with  . Chest Pain    Melanie Moreno is a 44 y.o. female presenting to the ED with 1 day of substernal chest pressure and pain, complaining of palpitations, also some nausea.  She also reports a headache in the back of her neck.  She reports some subjective fevers and chills.  This all began abruptly today around 2 PM.  She rates her chest pain is a 9 out of 10.  It does not radiate anywhere.  She has not had the symptoms before.  She reports she has not had any of her home medications for several weeks or months because her doctor will not prescribe her any without an office visit, she cannot afford going to the office.  This includes metoprolol 25 mg twice daily for blood pressure, she has not been taking.  She reports that she smokes a pack of cigarettes daily.  She also reports that she drinks alcohol daily, a couple of beers per day, as well as "going heavy on Sunday".  She says she has not had a day without drinking in at least 6 months.  She denies any known history of alcohol withdrawal, reports childhood history of seizures but no alcohol withdrawal seizures.  HPI     Past Medical History:  Diagnosis Date  . Anemia   . Hypertension   . Irregular heart rate   . Ovarian cyst     Patient Active Problem List   Diagnosis Date Noted  . Closed fracture of right distal radius 01/18/2020    Past Surgical History:  Procedure Laterality Date  . APPENDECTOMY    . BREAST ENHANCEMENT SURGERY       OB History   No obstetric history on file.     No family history on file.  Social History   Tobacco Use  . Smoking status: Current Every Day Smoker    Packs/day: 1.00    Types: Cigarettes  . Smokeless tobacco: Never Used  Substance Use Topics  . Alcohol use: Yes    Comment: occassional  . Drug use: Never    Home  Medications Prior to Admission medications   Medication Sig Start Date End Date Taking? Authorizing Provider  metoprolol tartrate (LOPRESSOR) 25 MG tablet Take 25 mg by mouth 2 (two) times daily.   Yes [provider]  cyclobenzaprine (FLEXERIL) 10 MG tablet Take 1 tablet (10 mg total) by mouth 2 (two) times daily as needed for muscle spasms. 09/26/19   Fayrene Helper, PA-C  ibuprofen (ADVIL) 600 MG tablet Take 1 tablet (600 mg total) by mouth every 6 (six) hours as needed. 09/26/19   Fayrene Helper, PA-C  metoprolol tartrate (LOPRESSOR) 50 MG tablet Take 1 tablet (50 mg total) by mouth 2 (two) times daily. 08/29/20 09/28/20  Terald Sleeper, MD  omeprazole (PRILOSEC) 20 MG capsule Take 1 capsule (20 mg total) by mouth 2 (two) times daily before a meal. 06/05/20   Fayrene Helper, PA-C  sucralfate (CARAFATE) 1 g tablet Take 1 tablet (1 g total) by mouth 4 (four) times daily -  with meals and at bedtime. 06/05/20   Fayrene Helper, PA-C    Allergies    Dilaudid [hydromorphone hcl]  Review of Systems   Review of Systems  Constitutional: Negative for chills and fever.  HENT: Negative for ear pain and sore throat.  Eyes: Negative for pain and visual disturbance.  Respiratory: Positive for shortness of breath. Negative for cough.   Cardiovascular: Positive for chest pain. Negative for palpitations.  Gastrointestinal: Positive for abdominal pain, nausea and vomiting.  Genitourinary: Negative for dysuria and hematuria.  Musculoskeletal: Positive for arthralgias and myalgias.  Skin: Negative for color change and rash.  Neurological: Positive for headaches. Negative for syncope.  Psychiatric/Behavioral: Negative for agitation and confusion.  All other systems reviewed and are negative.   Physical Exam Updated Vital Signs BP (!) 151/100   Pulse 86   Temp 98.1 F (36.7 C) (Oral)   Resp (!) 24   Ht 5\' 4"  (1.626 m)   Wt 54.4 kg   LMP 09/20/2015   SpO2 98%   BMI 20.60 kg/m   Physical  Exam Vitals and nursing note reviewed.  Constitutional:      General: She is not in acute distress.    Appearance: She is well-developed.  HENT:     Head: Normocephalic and atraumatic.  Eyes:     Conjunctiva/sclera: Conjunctivae normal.  Cardiovascular:     Rate and Rhythm: Normal rate and regular rhythm.     Heart sounds: No murmur heard.   Pulmonary:     Effort: Pulmonary effort is normal. No respiratory distress.     Breath sounds: Normal breath sounds.  Abdominal:     General: There is no abdominal bruit.     Palpations: Abdomen is soft. There is no fluid wave or mass.     Tenderness: There is no abdominal tenderness.  Musculoskeletal:     Cervical back: Normal range of motion and neck supple.  Skin:    General: Skin is warm and dry.  Neurological:     General: No focal deficit present.     Mental Status: She is alert and oriented to person, place, and time.  Psychiatric:        Mood and Affect: Mood normal.        Behavior: Behavior normal.     ED Results / Procedures / Treatments   Labs (all labs ordered are listed, but only abnormal results are displayed) Labs Reviewed  BASIC METABOLIC PANEL - Abnormal; Notable for the following components:      Result Value   Chloride 95 (*)    CO2 20 (*)    Anion gap 20 (*)    All other components within normal limits  CBC - Abnormal; Notable for the following components:   MCV 103.8 (*)    MCH 35.9 (*)    All other components within normal limits  RESPIRATORY PANEL BY RT PCR (FLU A&B, COVID)  TROPONIN I (HIGH SENSITIVITY)  TROPONIN I (HIGH SENSITIVITY)    EKG EKG Interpretation  Date/Time:  Monday August 29 2020 18:29:13 EST Ventricular Rate:  149 PR Interval:  124 QRS Duration: 64 QT Interval:  334 QTC Calculation: 526 R Axis:   78 Text Interpretation: Sinus tachycardia Left atrial enlargement Cannot rule out Anterior infarct , age undetermined Abnormal ECG No STEMI Confirmed by 09-19-1984 [phone]) on  08/29/2020 7:44:09 PM   Radiology DG Chest Portable 1 View  Result Date: 08/29/2020 CLINICAL DATA:  44 year old female with mid sternal chest pain for 1 day. EXAM: PORTABLE CHEST 1 VIEW COMPARISON:  Portable chest 01/16/2020 and earlier. FINDINGS: Portable AP upright view at 1928 hours. Lung volumes and mediastinal contours remain normal. Visualized tracheal air column is within normal limits. Allowing for portable technique the lungs are clear. No pneumothorax  or pleural effusion. Incidental breast implants. Stable mild thoracic scoliosis. No acute osseous abnormality identified. Negative visible bowel gas in the upper abdomen. IMPRESSION: Negative portable chest. Electronically Signed   By: Odessa Fleming M.D.   On: 08/29/2020 19:43    Procedures Procedures (including critical care time)  Medications Ordered in ED Medications  metoprolol tartrate (LOPRESSOR) tablet 25 mg (25 mg Oral Given 08/29/20 2116)  ondansetron (ZOFRAN) injection 4 mg (4 mg Intravenous Given 08/29/20 1903)  LORazepam (ATIVAN) injection 1 mg (1 mg Intravenous Given 08/29/20 2114)  sodium chloride 0.9 % bolus 1,000 mL ( Intravenous Stopped 08/29/20 2215)  ketorolac (TORADOL) 15 MG/ML injection 15 mg (15 mg Intravenous Given 08/29/20 2112)  ondansetron (ZOFRAN) injection 4 mg (4 mg Intravenous Given 08/29/20 2109)    ED Course  I have reviewed the triage vital signs and the nursing notes.  Pertinent labs & imaging results that were available during my care of the patient were reviewed by me and considered in my medical decision making (see chart for details).  44 yo female here with constellation of symptoms including tachycardia, palpitations, nausea, and hypertension and headache.  She has sinus tachycardia on arrival on her ecg per my interpretation, no STEMI.  Does not appear to be A Flutter or Fib to me.  She is out of her metoprolol medication which may be contibuting to a symptomatic arrhythmia & HTN  DDx includes viral  syndrome including covid vs arrhythmia vs anemia vs other  This also may be related to etoh withdrawal given her hx of persistent daily drinking.  Last drink was > 12 hours ago (yesterday)  I personally reviewed her labs including trop 7 -> 9, CBC unremarkable, BMP with anion gap which I suspect is likely 2/2 dehydration or some alcoholic ketosis.   Covid negative.  Pt given IV fluids, IV ativan, IV toradol, and IV zofran for nausea, headache, dehydration, and possible etoh withdrawal.  DG Chest personally reviewed with no acute findings  *  I have a low suspicion for ACS, PE, Aortic dissection, or other acute intraabdominal infection or emergency, based on her reassessment, vital signs, and clinical history.  Clinical Course as of Aug 30 2331  [facility] Aug 29, 2020  2224 Troponins flat, covid negative.   [MT]  2246 VS improved, feeling better after medications, will discharge, will provide prescription for BP meds   [MT]  2254 Patient insists that she was prescribed 100 mg metoprolol BID.  I see records only for 25 mg BID.  I am hesitant to prescribe that large of a dose.  I advised we would do 50 mg BID, and gave titration parameters for home.   [MT]    Clinical Course User Index [MT] Kymberlie Brazeau, Kermit Balo, MD    Final Clinical Impression(s) / ED Diagnoses Final diagnoses:  Hypertension, unspecified type    Rx / DC Orders ED Discharge Orders         Ordered    metoprolol tartrate (LOPRESSOR) 50 MG tablet  2 times daily        08/29/20 2254           Terald Sleeper, MD 08/29/20 2332

## 2020-08-29 NOTE — Discharge Instructions (Addendum)
Instructions  I prescribed blood pressure medications.  You can start by taking 25 mg tablets twice per day (1/2 a tablet).  Check your blood pressures in the morning and at night and keep a diary.  Your goal is a top number between 110 mmhg and 150 mmhg.  Make sure you buy a blood pressure cuff to monitor yourself at home.  If your top number is ABOVE 150 mmhg, increase your daily dose to 50 mg metoprolol twice daily.    If your blood pressure drops below 110 mmhg, or your heart rate drops under 60 beats per minute, or you feel very lightheaded, sluggish, or tired, you can reduce your dose to 25 mg metoprolol once daily.

## 2021-03-14 ENCOUNTER — Emergency Department (HOSPITAL_BASED_OUTPATIENT_CLINIC_OR_DEPARTMENT_OTHER)
Admission: EM | Admit: 2021-03-14 | Discharge: 2021-03-14 | Disposition: A | Discharge status: Home / Self Care | Payer: Medicaid Other | Attending: Emergency Medicine | Admitting: Emergency Medicine

## 2021-03-14 ENCOUNTER — Encounter (HOSPITAL_BASED_OUTPATIENT_CLINIC_OR_DEPARTMENT_OTHER): Payer: Self-pay

## 2021-03-14 ENCOUNTER — Other Ambulatory Visit: Payer: Self-pay

## 2021-03-14 DIAGNOSIS — K2900 Acute gastritis without bleeding: Secondary | ICD-10-CM

## 2021-03-14 DIAGNOSIS — F17210 Nicotine dependence, cigarettes, uncomplicated: Secondary | ICD-10-CM | POA: Insufficient documentation

## 2021-03-14 DIAGNOSIS — K2970 Gastritis, unspecified, without bleeding: Secondary | ICD-10-CM | POA: Insufficient documentation

## 2021-03-14 DIAGNOSIS — Z79899 Other long term (current) drug therapy: Secondary | ICD-10-CM | POA: Insufficient documentation

## 2021-03-14 DIAGNOSIS — I10 Essential (primary) hypertension: Secondary | ICD-10-CM | POA: Insufficient documentation

## 2021-03-14 MED ORDER — ALUM & MAG HYDROXIDE-SIMETH 200-200-20 MG/5ML PO SUSP
30.0000 mL | Freq: Once | ORAL | Status: AC
  Administered 2021-03-14: 30 mL via ORAL
  Filled 2021-03-14: qty 30

## 2021-03-14 NOTE — ED Notes (Signed)
Pt c/o pain to RUQ "comes in waves" states that it is similar to the pain she had when she was admitted with pancreatitis, with worsening pain.

## 2021-03-14 NOTE — ED Triage Notes (Signed)
Pt c/o abd pain started 5/20-seen at [facility] ED yesterday for same-states pain is worse-NAD-steady gait

## 2021-03-14 NOTE — ED Notes (Signed)
No relief from pain med.

## 2021-03-14 NOTE — ED Provider Notes (Signed)
[facility] EMERGENCY DEPARTMENT Provider Note  CSN: [id number] Arrival date & time: 03/14/21 1629    History Chief Complaint  Patient presents with  . Abdominal Pain    HPI  Melanie Moreno is a 45 y.o. female with history of gastritis and alcoholic pancreatitis, reports 2 days of epigastric pain, no vomiting. She was seen at [facility] ED last night for same and had labs which were unremarkable as well as a CT scan which showed gastritis vs PUD. She was advised to avoid alcohol and NSAIDs, increase home prilosec and given Rx for carafate which she has not gotten filled. She came here today due to continued pain. No vomiting, no diarrhea, no melena, no fevers.    Past Medical History:  Diagnosis Date  . Anemia   . Hypertension   . Irregular heart rate   . Ovarian cyst     Past Surgical History:  Procedure Laterality Date  . APPENDECTOMY    . BREAST ENHANCEMENT SURGERY      No family history on file.  Social History   Tobacco Use  . Smoking status: Current Every Day Smoker    Packs/day: 1.00    Types: Cigarettes  . Smokeless tobacco: Never Used  Substance Use Topics  . Alcohol use: Yes    Comment: occassional  . Drug use: Never     Home Medications Prior to Admission medications   Medication Sig Start Date End Date Taking? Authorizing Provider  cyclobenzaprine (FLEXERIL) 10 MG tablet Take 1 tablet (10 mg total) by mouth 2 (two) times daily as needed for muscle spasms. 09/26/19   Fayrene Helper, PA-C  ibuprofen (ADVIL) 600 MG tablet Take 1 tablet (600 mg total) by mouth every 6 (six) hours as needed. 09/26/19   Fayrene Helper, PA-C  metoprolol tartrate (LOPRESSOR) 25 MG tablet Take 25 mg by mouth 2 (two) times daily.    [provider]  metoprolol tartrate (LOPRESSOR) 50 MG tablet Take 1 tablet (50 mg total) by mouth 2 (two) times daily. 08/29/20 09/28/20  Terald Sleeper, MD  omeprazole (PRILOSEC) 20 MG capsule Take 1 capsule (20 mg total) by mouth 2 (two)  times daily before a meal. 06/05/20   Fayrene Helper, PA-C  sucralfate (CARAFATE) 1 g tablet Take 1 tablet (1 g total) by mouth 4 (four) times daily -  with meals and at bedtime. 06/05/20   Fayrene Helper, PA-C     Allergies    Dilaudid [hydromorphone hcl]   Review of Systems   Review of Systems A comprehensive review of systems was completed and negative except as noted in HPI.    Physical Exam BP (!) 168/127 (BP Location: Left Arm)   Pulse (!) 117   Temp 98.7 F (37.1 C) (Oral)   Resp 20   Ht 5\' 4"  (1.626 m)   Wt 59.4 kg   LMP 03/09/2021   SpO2 100%   BMI 22.49 kg/m   Physical Exam Vitals and nursing note reviewed.  Constitutional:      Appearance: Normal appearance.  HENT:     Head: Normocephalic and atraumatic.     Nose: Nose normal.     Mouth/Throat:     Mouth: Mucous membranes are moist.  Eyes:     Extraocular Movements: Extraocular movements intact.     Conjunctiva/sclera: Conjunctivae normal.  Cardiovascular:     Rate and Rhythm: Normal rate.  Pulmonary:     Effort: Pulmonary effort is normal.     Breath sounds: Normal  breath sounds.  Abdominal:     General: Abdomen is flat.     Palpations: Abdomen is soft.     Tenderness: There is abdominal tenderness in the epigastric area. There is no guarding. Negative signs include Murphy's sign and McBurney's sign.  Musculoskeletal:        General: No swelling. Normal range of motion.     Cervical back: Neck supple.  Skin:    General: Skin is warm and dry.  Neurological:     General: No focal deficit present.     Mental Status: She is alert.  Psychiatric:        Mood and Affect: Mood normal.      ED Results / Procedures / Treatments   Labs (all labs ordered are listed, but only abnormal results are displayed) Labs Reviewed - No data to display  EKG None   Radiology No results found.  Procedures Procedures  Medications Ordered in the ED Medications  alum & mag hydroxide-simeth (MAALOX/MYLANTA)  200-200-20 MG/5ML suspension 30 mL (30 mLs Oral Given 03/14/21 1656)     MDM Rules/Calculators/A&P MDM Patient with recurrent abdominal pain, admits to continued alcohol use (only on the weekends) and daily NSAID use. Comprehensive workup at [facility] <24hours ago was negative for acute pancreatitis but did show gastritis vs PUD on CT. She has diffuse tenderness here but no peritoneal signs to suggest perforation. She is upset she wasn't given Rx for narcotics but did not get her other Rx filled either. She was encouraged to get her Rx filled from last night, avoid irritating agents such as NSAIDs and alcohol and follow up with GI as directed.  ED Course  I have reviewed the triage vital signs and the nursing notes.  Pertinent labs & imaging results that were available during my care of the patient were reviewed by me and considered in my medical decision making (see chart for details).     Final Clinical Impression(s) / ED Diagnoses Final diagnoses:  Acute gastritis without hemorrhage, unspecified gastritis type    Rx / DC Orders ED Discharge Orders    None       Pollyann Savoy, MD 03/14/21 1714

## 2022-03-12 IMAGING — US US ABDOMEN LIMITED
1 series · 14 of 25 positions shown · non-contrast
Comparison: CT abdomen pelvis dated 05/17/2018.

CLINICAL DATA: Epigastric pain for 2 days.

EXAM:
ULTRASOUND ABDOMEN LIMITED RIGHT UPPER QUADRANT

[Series 1: us abdomen limited · 14 of 35 slices shown]
[im 1/35]
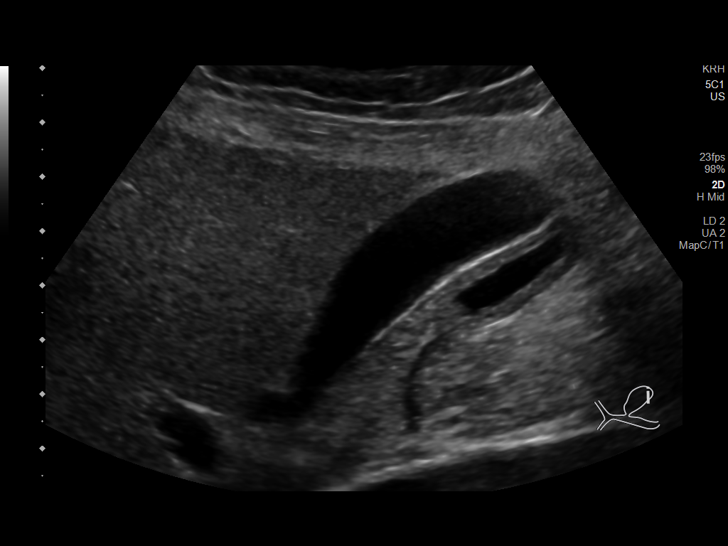
[im 3/35]
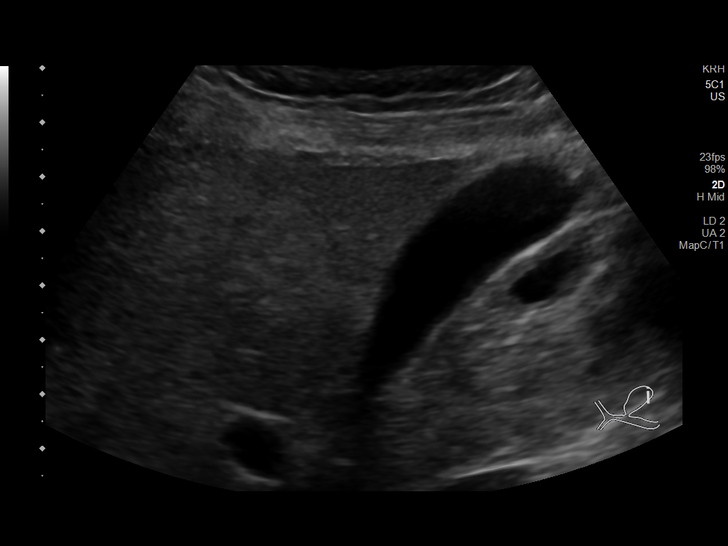
[im 6/35]
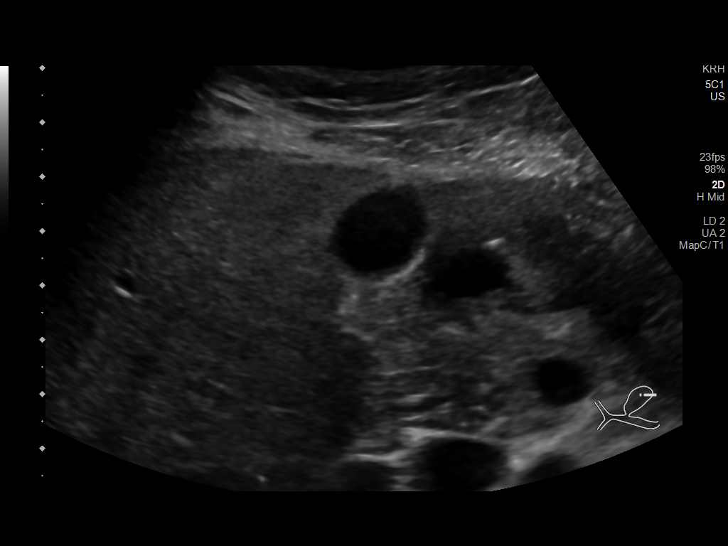
[im 9/35]
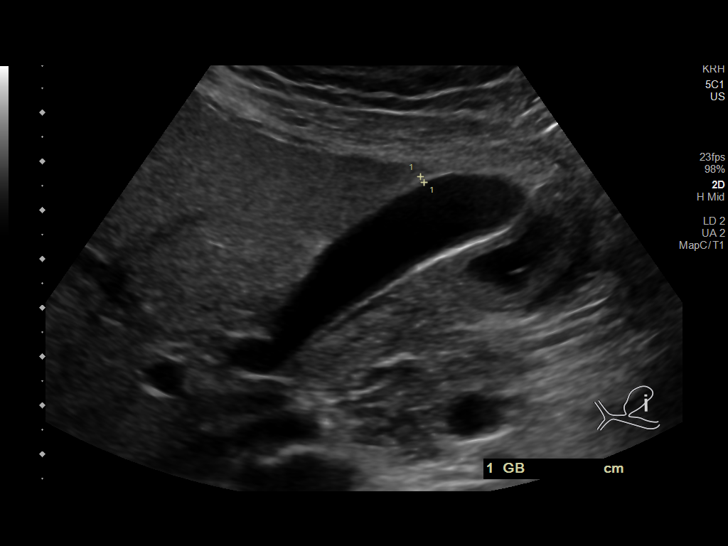
[im 12/35]
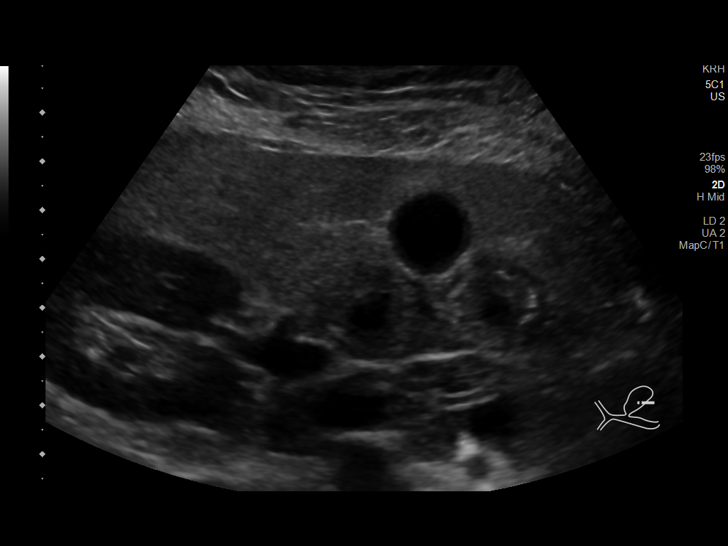
[im 13/35]
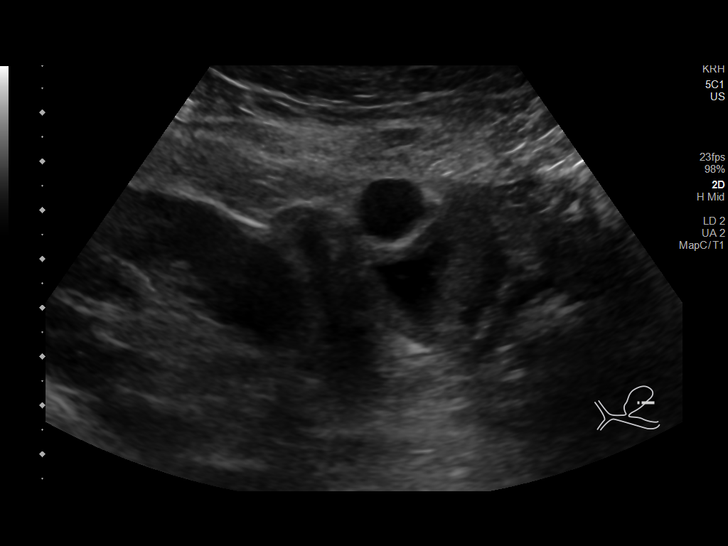
[im 16/35]
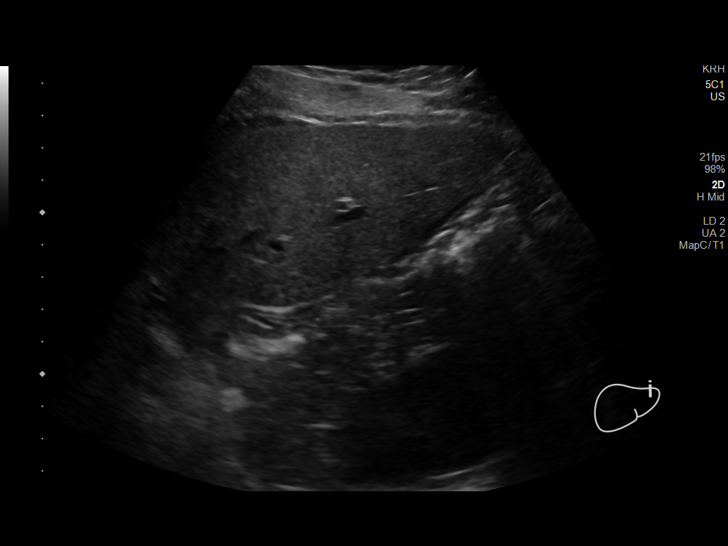
[im 19/35]
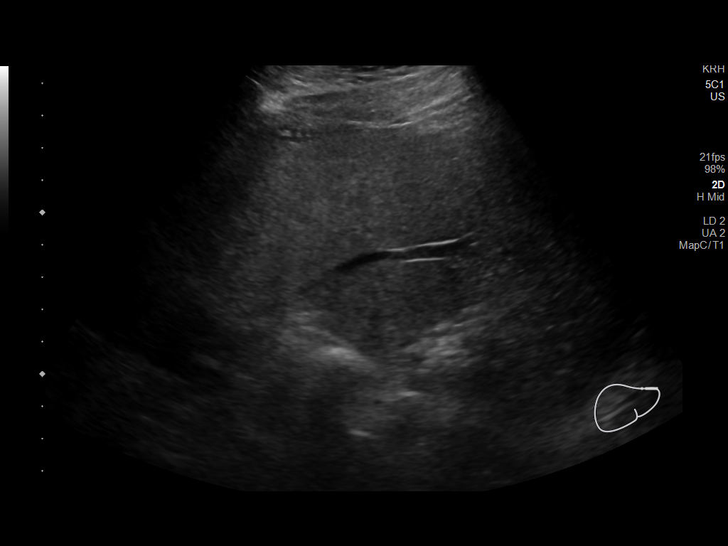
[im 22/35]
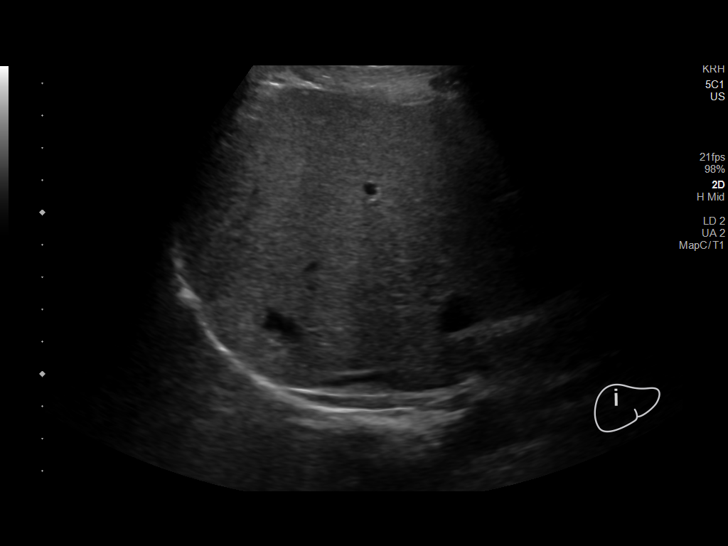
[im 23/35]
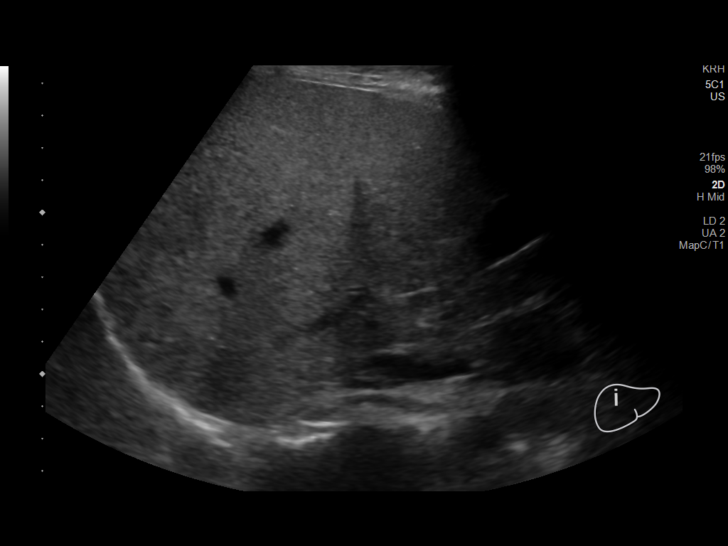
[im 26/35]
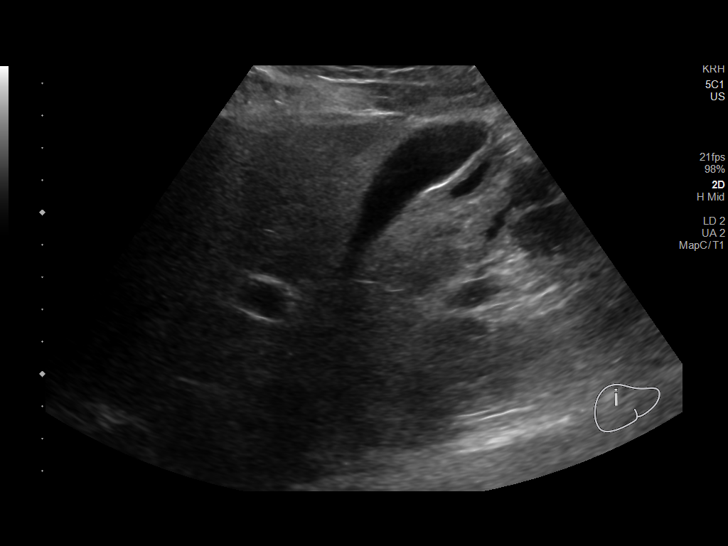
[im 29/35]
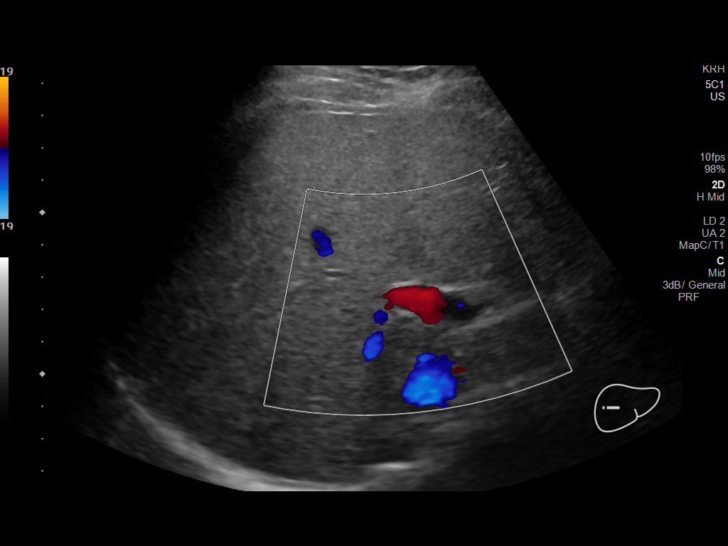
[im 32/35]
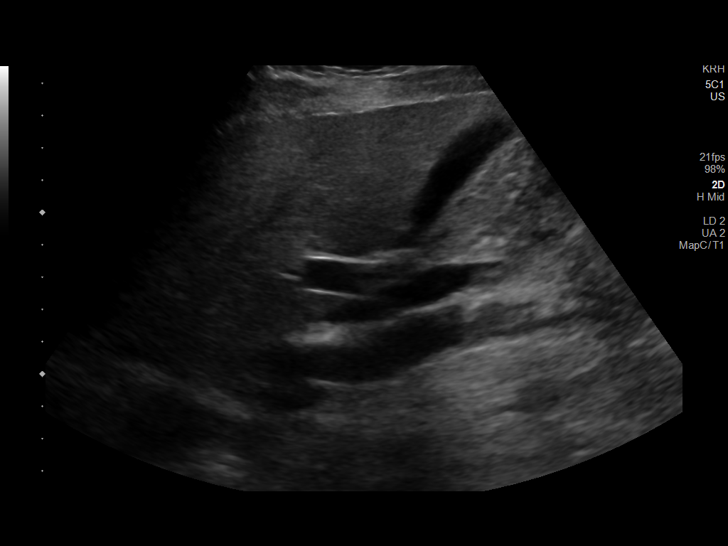
[im 35/35]
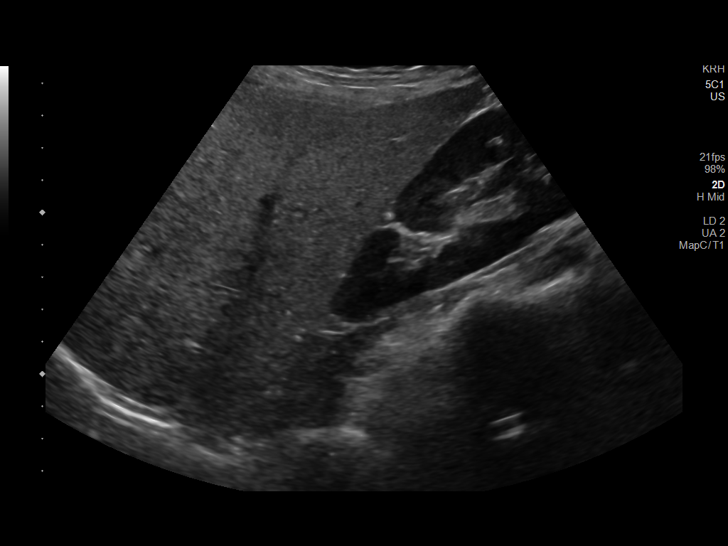

[14 of 25 positions shown; findings below may reference images not displayed]

FINDINGS: Gallbladder:

No gallstones or wall thickening visualized. No sonographic Murphy
sign noted by sonographer.

Common bile duct:

Diameter: 2 mm

Liver:

No focal lesion identified. Increased parenchymal echogenicity.
Portal vein is patent on color Doppler imaging with normal direction
of blood flow towards the liver.

Other: None.
IMPRESSION: 1. No evidence of acute cholecystitis.
2. Increased parenchymal echogenicity of the liver is nonspecific
but can be seen with hepatic steatosis.

## 2022-05-14 ENCOUNTER — Emergency Department (HOSPITAL_BASED_OUTPATIENT_CLINIC_OR_DEPARTMENT_OTHER): Payer: Medicaid Other

## 2022-05-14 ENCOUNTER — Encounter (HOSPITAL_BASED_OUTPATIENT_CLINIC_OR_DEPARTMENT_OTHER): Payer: Self-pay | Admitting: Emergency Medicine

## 2022-05-14 ENCOUNTER — Other Ambulatory Visit: Payer: Self-pay

## 2022-05-14 ENCOUNTER — Emergency Department (HOSPITAL_BASED_OUTPATIENT_CLINIC_OR_DEPARTMENT_OTHER)
Admission: EM | Admit: 2022-05-14 | Discharge: 2022-05-14 | Disposition: A | Discharge status: Home / Self Care | Payer: Medicaid Other | Attending: Emergency Medicine | Admitting: Emergency Medicine

## 2022-05-14 DIAGNOSIS — I10 Essential (primary) hypertension: Secondary | ICD-10-CM | POA: Insufficient documentation

## 2022-05-14 DIAGNOSIS — Z79899 Other long term (current) drug therapy: Secondary | ICD-10-CM | POA: Insufficient documentation

## 2022-05-14 DIAGNOSIS — E119 Type 2 diabetes mellitus without complications: Secondary | ICD-10-CM | POA: Insufficient documentation

## 2022-05-14 DIAGNOSIS — R0789 Other chest pain: Secondary | ICD-10-CM | POA: Insufficient documentation

## 2022-05-14 DIAGNOSIS — I160 Hypertensive urgency: Secondary | ICD-10-CM

## 2022-05-14 DIAGNOSIS — Y903 Blood alcohol level of 60-79 mg/100 ml: Secondary | ICD-10-CM | POA: Insufficient documentation

## 2022-05-14 DIAGNOSIS — F17200 Nicotine dependence, unspecified, uncomplicated: Secondary | ICD-10-CM | POA: Insufficient documentation

## 2022-05-14 LAB — RAPID URINE DRUG SCREEN, HOSP PERFORMED
Amphetamines: NOT DETECTED
Barbiturates: NOT DETECTED
Benzodiazepines: NOT DETECTED
Cocaine: NOT DETECTED
Opiates: NOT DETECTED
Tetrahydrocannabinol: NOT DETECTED

## 2022-05-14 LAB — BASIC METABOLIC PANEL
Anion gap: 13 (ref 5–15)
BUN: 13 mg/dL (ref 6–20)
CO2: 19 mmol/L — ABNORMAL LOW (ref 22–32)
Calcium: 9.2 mg/dL (ref 8.9–10.3)
Chloride: 105 mmol/L (ref 98–111)
Creatinine, Ser: 0.63 mg/dL (ref 0.44–1.00)
GFR, Estimated: >60 mL/min (ref >60)
Glucose, Bld: 107 mg/dL — ABNORMAL HIGH (ref 70–99)
Potassium: 3.4 mmol/L — ABNORMAL LOW (ref 3.5–5.1)
Sodium: 137 mmol/L (ref 135–145)

## 2022-05-14 LAB — CBC
HCT: 42.9 % (ref 36.0–46.0)
Hemoglobin: 15.2 g/dL — ABNORMAL HIGH (ref 12.0–15.0)
MCH: 36.3 pg — ABNORMAL HIGH (ref 26.0–34.0)
MCHC: 35.4 g/dL (ref 30.0–36.0)
MCV: 102.4 fL — ABNORMAL HIGH (ref 80.0–100.0)
Platelets: 221 10*3/uL (ref 150–400)
RBC: 4.19 MIL/uL (ref 3.87–5.11)
RDW: 14.2 % (ref 11.5–15.5)
WBC: 10.1 10*3/uL (ref 4.0–10.5)
nRBC: 0 % (ref 0.0–0.2)

## 2022-05-14 LAB — TROPONIN I (HIGH SENSITIVITY)
Troponin I (High Sensitivity): 3 ng/L (ref <18)
Troponin I (High Sensitivity): 4 ng/L (ref <18)

## 2022-05-14 LAB — PREGNANCY, URINE: Preg Test, Ur: NEGATIVE

## 2022-05-14 LAB — CBG MONITORING, ED: Glucose-Capillary: 121 mg/dL — ABNORMAL HIGH (ref 70–99)

## 2022-05-14 LAB — ETHANOL: Alcohol, Ethyl (B): 77 mg/dL — ABNORMAL HIGH (ref <10)

## 2022-05-14 MED ORDER — METOPROLOL TARTRATE 25 MG PO TABS: 25.0000 mg | ORAL_TABLET | Freq: Two times a day (BID) | ORAL | 0 refills | Status: DC

## 2022-05-14 MED ORDER — HYDRALAZINE HCL 20 MG/ML IJ SOLN
5.0000 mg | Freq: Once | INTRAMUSCULAR | Status: AC
  Administered 2022-05-14: 5 mg via INTRAVENOUS
  Filled 2022-05-14: qty 1

## 2022-05-14 MED ORDER — LORAZEPAM 1 MG PO TABS
1.0000 mg | ORAL_TABLET | Freq: Once | ORAL | Status: AC
  Administered 2022-05-14: 1 mg via ORAL
  Filled 2022-05-14: qty 1

## 2022-05-14 MED ORDER — METOPROLOL TARTRATE 5 MG/5ML IV SOLN
5.0000 mg | Freq: Once | INTRAVENOUS | Status: AC
  Administered 2022-05-14: 5 mg via INTRAVENOUS
  Filled 2022-05-14: qty 5

## 2022-05-14 NOTE — ED Notes (Signed)
Dc instructions and scripts reviewed with pt no questions or concerns at this time. Will follow up with wellness clinic.

## 2022-05-14 NOTE — Discharge Instructions (Signed)
Please follow-up with the wellness clinic can get you in quickly and help prescribe your antihypertensives.  Refills sent to your pharmacy.

## 2022-05-14 NOTE — ED Notes (Signed)
46yo white female presents with feeling confused, states she had been out in the sun today at a pool for less than and began having some chest pain, nausea, shortness of breath. Upon presentation to the Triage Room, NBP elevated, pt appeared altered and delayed in responding to some questions, capillary refill was delayed >5 sec, immediately placed in exam room 8 with EDP at bedside.

## 2022-05-14 NOTE — ED Provider Notes (Signed)
[facility] EMERGENCY DEPARTMENT Provider Note   CSN: [id number] Arrival date & time: 05/14/22  1542     History  Chief Complaint  Patient presents with   Hypertension   Chest Pain    Melanie Moreno is a 46 y.o. female.  Patient is a 46 year old female with past medical history of hypertension and current tobacco smoker presenting for complaints of " feeling disoriented".  Patient states she just met up with her friends to go swimming in the pool had been outside for approximately 5 minutes before she started to feel disoriented, confused, with chest pressure.  Patient states she recently ran out of her blood pressure medications 1 week ago and has not had it since.  Blood pressure on arrival 187/116.  Otherwise denies any difficulty breathing.  Denies any dehydration, fever, chills, nausea, vomiting, diarrhea.  Patient states she is not under the influence of alcohol or any drugs at this time.  History of diabetes or hypoglycemia.  No falls or head trauma.  The history is provided by the patient. No language interpreter was used.  Hypertension Associated symptoms include chest pain. Pertinent negatives include no abdominal pain and no shortness of breath.  Chest Pain Associated symptoms: no abdominal pain, no back pain, no cough, no fever, no palpitations, no shortness of breath and no vomiting        Home Medications Prior to Admission medications   Medication Sig Start Date End Date Taking? Authorizing Provider  cyclobenzaprine (FLEXERIL) 10 MG tablet Take 1 tablet (10 mg total) by mouth 2 (two) times daily as needed for muscle spasms. 09/26/19   Domenic Moras, PA-C  ibuprofen (ADVIL) 600 MG tablet Take 1 tablet (600 mg total) by mouth every 6 (six) hours as needed. 09/26/19   Domenic Moras, PA-C  metoprolol tartrate (LOPRESSOR) 25 MG tablet Take 25 mg by mouth 2 (two) times daily.    [provider]  metoprolol tartrate (LOPRESSOR) 50 MG tablet Take 1 tablet (50 mg  total) by mouth 2 (two) times daily. 08/29/20 09/28/20  Wyvonnia Dusky, MD  omeprazole (PRILOSEC) 20 MG capsule Take 1 capsule (20 mg total) by mouth 2 (two) times daily before a meal. 06/05/20   Domenic Moras, PA-C  sucralfate (CARAFATE) 1 g tablet Take 1 tablet (1 g total) by mouth 4 (four) times daily -  with meals and at bedtime. 06/05/20   Domenic Moras, PA-C      Allergies    Dilaudid [hydromorphone hcl]    Review of Systems   Review of Systems  Constitutional:  Negative for chills and fever.  HENT:  Negative for ear pain and sore throat.   Eyes:  Negative for pain and visual disturbance.  Respiratory:  Negative for cough and shortness of breath.   Cardiovascular:  Positive for chest pain. Negative for palpitations.  Gastrointestinal:  Negative for abdominal pain and vomiting.  Genitourinary:  Negative for dysuria and hematuria.  Musculoskeletal:  Negative for arthralgias and back pain.  Skin:  Negative for color change and rash.  Neurological:  Negative for seizures and syncope.  All other systems reviewed and are negative.   Physical Exam Updated Vital Signs BP (!) 145/87   Pulse 91   Temp 98.2 F (36.8 C) (Oral)   Resp 19   Wt 56.7 kg   LMP 01/14/2022 (Approximate)   SpO2 99%   BMI 21.46 kg/m  Physical Exam Vitals and nursing note reviewed.  Constitutional:      General: She  is not in acute distress.    Appearance: She is well-developed.  HENT:     Head: Normocephalic and atraumatic.  Eyes:     Conjunctiva/sclera: Conjunctivae normal.  Cardiovascular:     Rate and Rhythm: Normal rate and regular rhythm.     Heart sounds: No murmur heard. Pulmonary:     Effort: Pulmonary effort is normal. No respiratory distress.     Breath sounds: Normal breath sounds.  Abdominal:     Palpations: Abdomen is soft.     Tenderness: There is no abdominal tenderness.  Musculoskeletal:        General: No swelling.     Cervical back: Neck supple.  Skin:    General: Skin is warm  and dry.     Capillary Refill: Capillary refill takes less than 2 seconds.  Neurological:     Mental Status: She is alert.  Psychiatric:        Mood and Affect: Mood normal.     ED Results / Procedures / Treatments   Labs (all labs ordered are listed, but only abnormal results are displayed) Labs Reviewed  BASIC METABOLIC PANEL - Abnormal; Notable for the following components:      Result Value   Potassium 3.4 (*)    CO2 19 (*)    Glucose, Bld 107 (*)    All other components within normal limits  CBC - Abnormal; Notable for the following components:   Hemoglobin 15.2 (*)    MCV 102.4 (*)    MCH 36.3 (*)    All other components within normal limits  ETHANOL - Abnormal; Notable for the following components:   Alcohol, Ethyl (B) 77 (*)    All other components within normal limits  CBG MONITORING, ED - Abnormal; Notable for the following components:   Glucose-Capillary 121 (*)    All other components within normal limits  PREGNANCY, URINE  RAPID URINE DRUG SCREEN, HOSP PERFORMED  TROPONIN I (HIGH SENSITIVITY)  TROPONIN I (HIGH SENSITIVITY)    EKG EKG Interpretation  Date/Time:  Monday May 14 2022 15:55:40 EDT Ventricular Rate:  94 PR Interval:  190 QRS Duration: 83 QT Interval:  363 QTC Calculation: 454 R Axis:   68 Text Interpretation: Sinus rhythm Probable left atrial enlargement Borderline T wave abnormalities Confirmed by Campbell Stall ([id number]) on 04/16/9484 5:53:04 PM  Radiology DG Chest 2 View  Result Date: 05/14/2022 CLINICAL DATA:  Chest pressure and hypertension EXAM: CHEST - 2 VIEW COMPARISON:  08/29/2020 FINDINGS: The heart size and mediastinal contours are within normal limits. Both lungs are clear. The visualized skeletal structures are unremarkable. IMPRESSION: No active cardiopulmonary disease. Electronically Signed   By: Van Clines M.D.   On: 05/14/2022 16:25    Procedures Procedures    Medications Ordered in ED Medications  metoprolol  tartrate (LOPRESSOR) injection 5 mg (5 mg Intravenous Given 05/14/22 1633)  LORazepam (ATIVAN) tablet 1 mg (1 mg Oral Given 05/14/22 1747)  hydrALAZINE (APRESOLINE) injection 5 mg (5 mg Intravenous Given 05/14/22 1748)    ED Course/ Medical Decision Making/ A&P                           Medical Decision Making Amount and/or Complexity of Data Reviewed Labs: ordered. Radiology: ordered.  Risk Prescription drug management.   58:62 PM 46 year old female with past medical history of hypertension and current tobacco smoker presenting for complaints of " feeling disoriented".  Patient is alert and oriented x3,  no acute distress, afebrile, stable vital signs.  No signs of respiratory distress.  Patient equal bilateral breath sounds with no adventitious lung sounds.  No new heart murmurs.  ECG stable without ST segment elevation or depression.  Troponins, electrolytes, laboratory studies all within normal limits.  Chest x-ray stable.  Low suspicion for cardiopulmonary process.  No hypoglycemia.  No anemia.  Patient hypertensive at 187/116 on arrival with improvement to 160/93 without intervention.  Patient states has been out of her blood pressure meds for a month.  Blood pressure medications given.  Consider hypertensive urgency without emergency resulting in chest tightness.   I independently interpreted patient's labs and EKG.   Stable this time for discharge with follow-up with primary care physician for further management of hypertension.  Refill of home meds sent to pharmacy.  Patient in no distress and overall condition improved here in the ED. Detailed discussions were had with the patient regarding current findings, and need for close f/u with PCP or on call doctor. The patient has been instructed to return immediately if the symptoms worsen in any way for re-evaluation. Patient verbalized understanding and is in agreement with current care plan. All questions answered prior to  discharge.             Final Clinical Impression(s) / ED Diagnoses Final diagnoses:  None    Rx / DC Orders ED Discharge Orders     None         Lianne Cure, DO 89/84/21 2100

## 2022-05-14 NOTE — ED Notes (Signed)
Patient transported to X-ray via stretcher with RT staff, sr x 2 up

## 2022-05-14 NOTE — ED Triage Notes (Signed)
Pt was at pool and started to feel disoriented and heart racing. Pt states hasn't had BP meds in 1 week. Pt is alert and oriented but slow to answer questions.

## 2022-05-14 NOTE — ED Notes (Signed)
Pt. Has not been taking her B/P meds for approx. 1 week or more and is feeling like she has a tight chest and now has anxiety as well.  Pt. Has no stroke like symptoms.  Speech is clear with no visual disturbances.

## 2022-05-31 ENCOUNTER — Emergency Department (HOSPITAL_BASED_OUTPATIENT_CLINIC_OR_DEPARTMENT_OTHER): Payer: Medicaid Other | Admitting: Radiology

## 2022-05-31 ENCOUNTER — Encounter (HOSPITAL_BASED_OUTPATIENT_CLINIC_OR_DEPARTMENT_OTHER): Payer: Self-pay

## 2022-05-31 ENCOUNTER — Emergency Department (HOSPITAL_BASED_OUTPATIENT_CLINIC_OR_DEPARTMENT_OTHER)
Admission: EM | Admit: 2022-05-31 | Discharge: 2022-05-31 | Disposition: A | Discharge status: Home / Self Care | Payer: Medicaid Other | Attending: Emergency Medicine | Admitting: Emergency Medicine

## 2022-05-31 ENCOUNTER — Other Ambulatory Visit: Payer: Self-pay

## 2022-05-31 DIAGNOSIS — W19XXXA Unspecified fall, initial encounter: Secondary | ICD-10-CM | POA: Insufficient documentation

## 2022-05-31 DIAGNOSIS — M25561 Pain in right knee: Secondary | ICD-10-CM | POA: Insufficient documentation

## 2022-05-31 DIAGNOSIS — M25562 Pain in left knee: Secondary | ICD-10-CM | POA: Insufficient documentation

## 2022-05-31 DIAGNOSIS — I10 Essential (primary) hypertension: Secondary | ICD-10-CM | POA: Insufficient documentation

## 2022-05-31 MED ORDER — METOPROLOL TARTRATE 50 MG PO TABS: 50.0000 mg | ORAL_TABLET | Freq: Two times a day (BID) | ORAL | 0 refills | Status: DC

## 2022-05-31 NOTE — Discharge Instructions (Signed)
You may take some over-the-counter anti-inflammatories to help with your bilateral knee pain.  You were provided with a copy of your x-rays of both of these were negative without any fracture.  You were given the phone number to orthopedics if you To schedule an appointment for further evaluation of your ongoing knee pain.  You were provided with a prescription for your blood pressure medication, please this as prescribed.

## 2022-05-31 NOTE — ED Provider Notes (Signed)
[facility] EMERGENCY DEPT Provider Note   CSN: [id number] Arrival date & time: 05/31/22  1846     History  Chief Complaint  Patient presents with   Marletta Lor    Melanie Moreno is a 46 y.o. female.  46 year old female with a past medical history hypertension presents to the ED status post mechanical fall.  Patient reports she was playing with the reality with her daughters last night when suddenly she "fell off a cliff ", landing on both of her knees.  She reports pain along the left knee and right knee exacerbated with any ambulation along with any flexion.  She reports trying to sit with her legs crossed at work and there was no improvement in her symptoms.  Did not strike her head, she has taken some BC powders to help with pain control without much improvement.  Denies any other injury  The history is provided by the patient.  Fall       Home Medications Prior to Admission medications   Medication Sig Start Date End Date Taking? Authorizing Provider  cyclobenzaprine (FLEXERIL) 10 MG tablet Take 1 tablet (10 mg total) by mouth 2 (two) times daily as needed for muscle spasms. 09/26/19   Fayrene Helper, PA-C  ibuprofen (ADVIL) 600 MG tablet Take 1 tablet (600 mg total) by mouth every 6 (six) hours as needed. 09/26/19   Fayrene Helper, PA-C  metoprolol tartrate (LOPRESSOR) 50 MG tablet Take 1 tablet (50 mg total) by mouth 2 (two) times daily. 05/31/22 06/30/22 Yes Lavanna Rog, Leonie Douglas, PA-C  omeprazole (PRILOSEC) 20 MG capsule Take 1 capsule (20 mg total) by mouth 2 (two) times daily before a meal. 06/05/20   Fayrene Helper, PA-C  sucralfate (CARAFATE) 1 g tablet Take 1 tablet (1 g total) by mouth 4 (four) times daily -  with meals and at bedtime. 06/05/20   Fayrene Helper, PA-C      Allergies    Dilaudid [hydromorphone hcl]    Review of Systems   Review of Systems  Constitutional:  Negative for fever.  Musculoskeletal:  Positive for arthralgias.    Physical Exam Updated Vital Signs BP  124/77   Pulse 79   Temp (!) 97.4 F (36.3 C)   Resp 18   Ht 5\' 4"  (1.626 m)   Wt 56.7 kg   LMP 01/14/2022 (Approximate)   SpO2 99%   BMI 21.46 kg/m  Physical Exam Vitals and nursing note reviewed.  Constitutional:      Appearance: Normal appearance.  HENT:     Head: Normocephalic and atraumatic.  Eyes:     Pupils: Pupils are equal, round, and reactive to light.  Cardiovascular:     Rate and Rhythm: Normal rate.  Pulmonary:     Effort: Pulmonary effort is normal.  Abdominal:     General: Abdomen is flat.     Tenderness: There is no abdominal tenderness.  Musculoskeletal:     Cervical back: Normal range of motion and neck supple.     Right knee: Tenderness present.     Left knee: Tenderness present over the medial joint line.     Comments: No BL effusions noted. Mild erythema, no abrasions noted.   Skin:    General: Skin is warm and dry.  Neurological:     Mental Status: She is alert and oriented to person, place, and time.     ED Results / Procedures / Treatments   Labs (all labs ordered are listed, but only abnormal results are displayed) Labs  Reviewed - No data to display  EKG None  Radiology DG Knee Complete 4 Views Left  Result Date: 05/31/2022 CLINICAL DATA:  Fall, bilateral knee pain EXAM: LEFT KNEE - COMPLETE 4+ VIEW COMPARISON:  None Available. FINDINGS: No evidence of fracture, dislocation, or joint effusion. No evidence of arthropathy or other focal bone abnormality. Soft tissues are unremarkable. IMPRESSION: Negative. Electronically Signed   By: Charlett Nose M.D.   On: 05/31/2022 19:16   DG Knee Complete 4 Views Right  Result Date: 05/31/2022 CLINICAL DATA:  Fall, bilateral knee pain EXAM: RIGHT KNEE - COMPLETE 4+ VIEW COMPARISON:  None Available. FINDINGS: No evidence of fracture, dislocation, or joint effusion. No evidence of arthropathy or other focal bone abnormality. Soft tissues are unremarkable. IMPRESSION: Negative. Electronically Signed   By:  Charlett Nose M.D.   On: 05/31/2022 19:15    Procedures Procedures    Medications Ordered in ED Medications - No data to display  ED Course/ Medical Decision Making/ A&P                           Medical Decision Making Amount and/or Complexity of Data Reviewed Radiology: ordered.   Patient with here s/p fall while playing virtual reality with her daughters.  Reports pain exacerbated with any kind of ambulation.  She has not taken anything aside from a BC powder without much improvement in symptoms.  During evaluation patient was ambulatory in the ED with a steady gait.  There is no signs of knee instability bilaterally however there is pain along the medial aspect of her left knee.  Mild bruising along with slight swelling to the right knee dose noted but no notable effusion. Neurovascularly intact.    Xray of her right and left leg without any acute findings.  Discussed these results at length with patient, we discussed anti-inflammatories at home.  She is also requesting medication refill on her blood pressure medication.  She is currently takes Lopressor 50 mg twice daily, states that she has not been able to set up an appointment with primary care physician for a refill.  This was given today to patient. Her blood pressure is within normal limits today, no chest pain, no sob, no headache. Patient stable for discharge.    Portions of this note were generated with Scientist, clinical (histocompatibility and immunogenetics). Dictation errors may occur despite best attempts at proofreading.   Final Clinical Impression(s) / ED Diagnoses Final diagnoses:  Fall, initial encounter  Acute bilateral knee pain    Rx / DC Orders ED Discharge Orders          Ordered    metoprolol tartrate (LOPRESSOR) 50 MG tablet  2 times daily        05/31/22 1931              Claude Manges, PA-C 05/31/22 1935    Virgina Norfolk, DO 05/31/22 2210

## 2022-05-31 NOTE — ED Triage Notes (Signed)
Patient here POV from Home.  Endorses Falling Yesterday PM while Playing a VR Game. No Head Injury. No LOC. No Anticoagulants.  Pain worsened today to Bilateral Knees.   NAD Noted during Triage. A&Ox4. GCS 15. Ambulatory.

## 2022-06-05 IMAGING — DX DG CHEST 1V PORT
1 series · 1 of 1 positions shown · non-contrast
Comparison: Portable chest 01/16/2020 and earlier.

CLINICAL DATA: 44-year-old female with mid sternal chest pain for 1
day.

EXAM:
PORTABLE CHEST 1 VIEW

[chest ap]
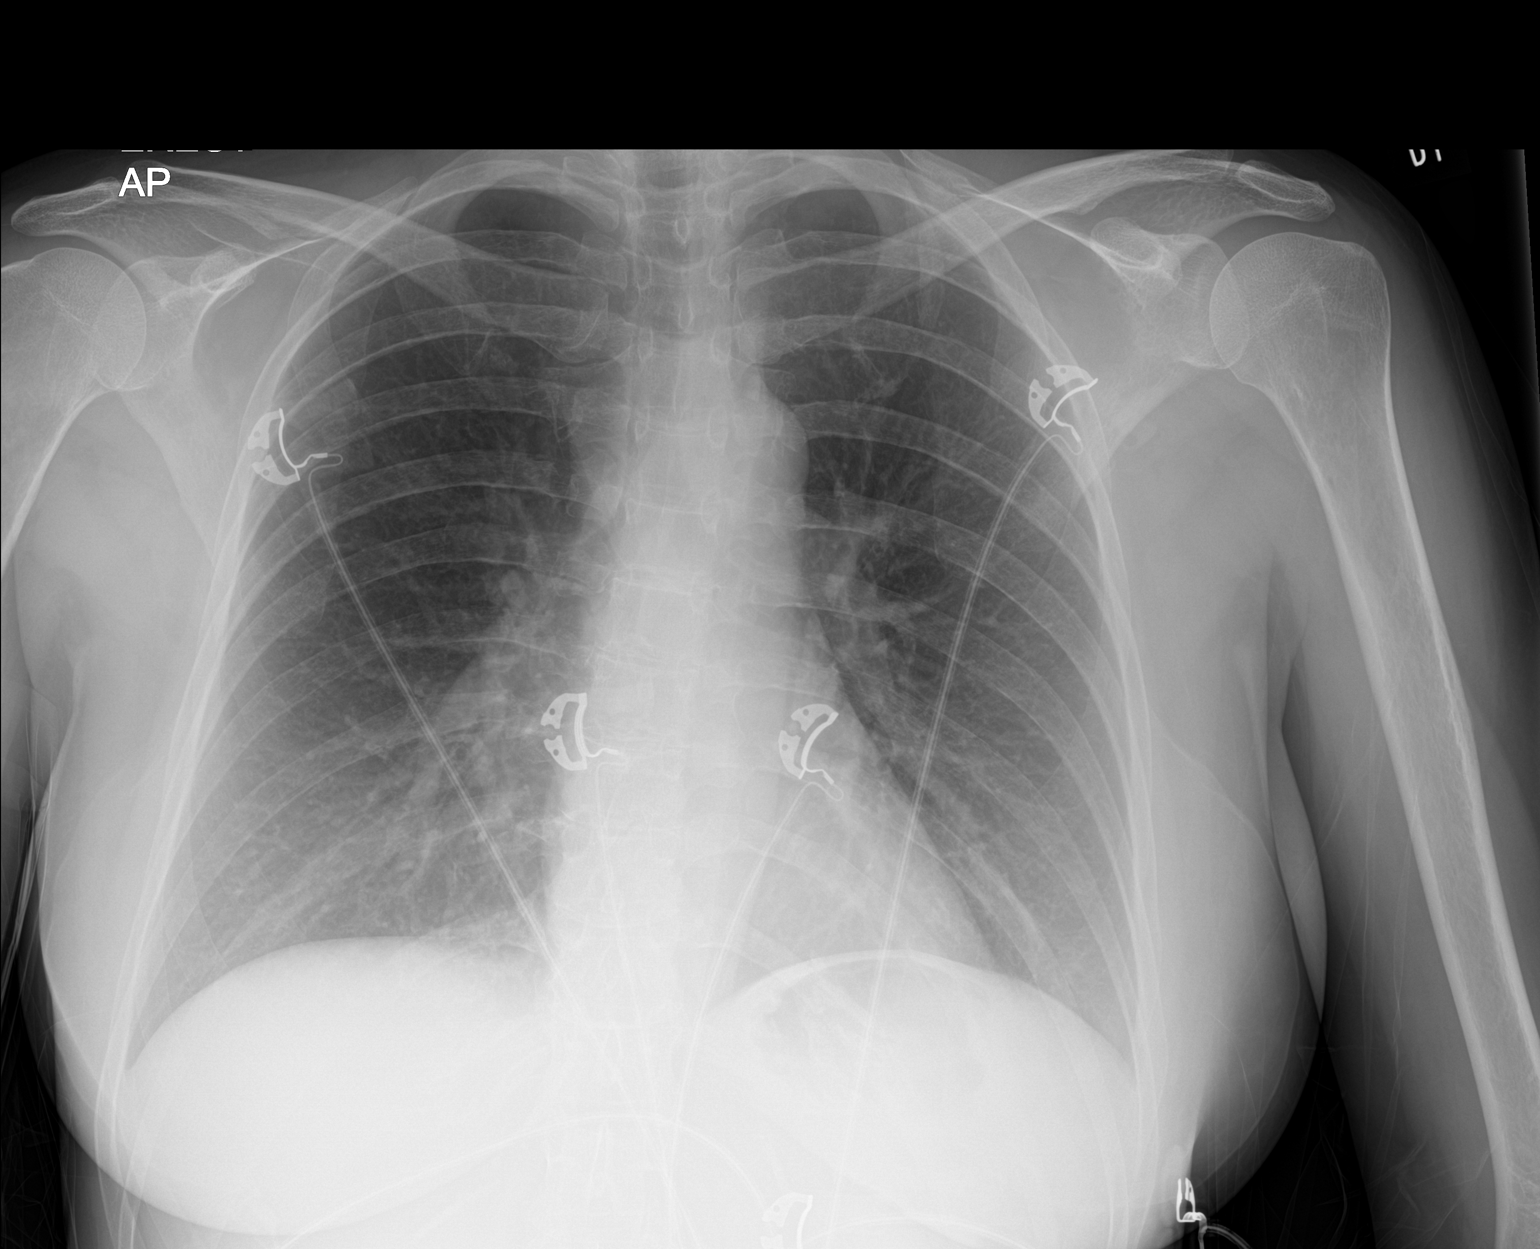

[1 of 1 positions shown; findings below may reference images not displayed]

FINDINGS: Portable AP upright view at 3207 hours. Lung volumes and mediastinal
contours remain normal. Visualized tracheal air column is within
normal limits. Allowing for portable technique the lungs are clear.
No pneumothorax or pleural effusion. Incidental breast implants.
Stable mild thoracic scoliosis. No acute osseous abnormality
identified. Negative visible bowel gas in the upper abdomen.
IMPRESSION: Negative portable chest.

## 2022-06-29 ENCOUNTER — Ambulatory Visit: Payer: Self-pay | Admitting: *Deleted

## 2022-06-29 NOTE — Telephone Encounter (Signed)
   Chief Complaint: thumb pain Symptoms: sore and swollen Frequency: constant 1 month Pertinent Negatives: Patient denies fever and redness Disposition: [] ED /[] Urgent Care (no appt availability in office) / [] Appointment(In office/virtual)/ []  [facility]/ [] Home Care/ [] Refused Recommended Disposition /[x] [facility]/ []  Follow-up with PCP Additional Notes: Pt awaiting a new pt appt, advised to switch from Tylenol to Advil and to go to Tuesday. Pt agrees with plan, home care reviewed. Reason for Disposition  [1] Swollen joint AND [2] no fever or redness  Answer Assessment - Initial Assessment Questions 1. ONSET: "When did the pain start?"      A month 2. LOCATION and RADIATION: "Where is the pain located?"  (e.g., fingertip, around nail, joint, entire  finger)      Right thumb 3. SEVERITY: "How bad is the pain?" "What does it keep you from doing?"   (Scale 1-10; or mild, moderate, severe)  - MILD (1-3): doesn't interfere with normal activities.   - MODERATE (4-7): interferes with normal activities or awakens from sleep.  - SEVERE (8-10): excruciating pain, unable to hold a glass of water or bend finger even a little.     8 4. APPEARANCE: "What does the finger look like?" (e.g., redness, swelling, bruising, pallor)     Swollen, can not bend, whole thumb is swollen, no injury 5. WORK OR EXERCISE: "Has there been any recent work or exercise that involved this part (i.e., fingers or hand) of the body?"     no 6. CAUSE: "What do you think is causing the pain?"     no 7. AGGRAVATING FACTORS: "What makes the pain worse?" (e.g., using computer)     Moving it 8. OTHER SYMPTOMS: "Do you have any other symptoms?" (e.g., fever, neck pain, numbness)     Feels numb 9. PREGNANCY: "Is there any chance you are pregnant?" "When was your last menstrual period?"     no  Protocols used: Finger Pain-A-AH

## 2022-07-03 ENCOUNTER — Encounter: Payer: Self-pay | Admitting: Physician Assistant

## 2022-07-03 ENCOUNTER — Ambulatory Visit: Payer: Self-pay | Admitting: Physician Assistant

## 2022-07-03 VITALS — BP 154/111 | HR 81 | Ht 64.0 in | Wt 123.0 lb

## 2022-07-03 DIAGNOSIS — M79644 Pain in right finger(s): Secondary | ICD-10-CM

## 2022-07-03 DIAGNOSIS — F17210 Nicotine dependence, cigarettes, uncomplicated: Secondary | ICD-10-CM

## 2022-07-03 DIAGNOSIS — Z8719 Personal history of other diseases of the digestive system: Secondary | ICD-10-CM

## 2022-07-03 DIAGNOSIS — I10 Essential (primary) hypertension: Secondary | ICD-10-CM

## 2022-07-03 DIAGNOSIS — F17200 Nicotine dependence, unspecified, uncomplicated: Secondary | ICD-10-CM

## 2022-07-03 DIAGNOSIS — Z13220 Encounter for screening for lipoid disorders: Secondary | ICD-10-CM

## 2022-07-03 MED ORDER — METOPROLOL TARTRATE 50 MG PO TABS: 50.0000 mg | ORAL_TABLET | Freq: Two times a day (BID) | ORAL | 1 refills | Status: DC

## 2022-07-03 MED ORDER — MELOXICAM 7.5 MG PO TABS: 7.5000 mg | ORAL_TABLET | Freq: Every day | ORAL | 0 refills | Status: DC

## 2022-07-03 NOTE — Progress Notes (Signed)
New Patient Office Visit  Subjective    Patient ID: Melanie Moreno, female    DOB: [birth year]  Age: 46 y.o. MRN: [id number]  CC:  Chief Complaint  Patient presents with   Hypertension    HPI Melanie Moreno requests a refill of her blood pressure medication.  States that she had restarted it approximately 1 month ago, last dose was yesterday morning.  States that she does not check her blood pressure at home.  Denies hypertensive symptoms.  States that she has been experiencing a sore right thumb with swelling for the past month.  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  States that both of her hands hurt first thing in the morning, mainly in the joints, states that this has been going on for the past 2 weeks.  States that she will have numbness in both of her hands when waking up despite sleeping positions.  States that she has tried Tylenol and ibuprofen 800 mg without relief.     Outpatient Encounter Medications as of 07/03/2022  Medication Sig   meloxicam (MOBIC) 7.5 MG tablet Take 1 tablet (7.5 mg total) by mouth daily.   cyclobenzaprine (FLEXERIL) 10 MG tablet Take 1 tablet (10 mg total) by mouth 2 (two) times daily as needed for muscle spasms.   ibuprofen (ADVIL) 600 MG tablet Take 1 tablet (600 mg total) by mouth every 6 (six) hours as needed.   metoprolol tartrate (LOPRESSOR) 50 MG tablet Take 1 tablet (50 mg total) by mouth 2 (two) times daily.   omeprazole (PRILOSEC) 20 MG capsule Take 1 capsule (20 mg total) by mouth 2 (two) times daily before a meal.   sucralfate (CARAFATE) 1 g tablet Take 1 tablet (1 g total) by mouth 4 (four) times daily -  with meals and at bedtime.   [DISCONTINUED] metoprolol tartrate (LOPRESSOR) 50 MG tablet Take 1 tablet (50 mg total) by mouth 2 (two) times daily.   No facility-administered encounter medications on file as of 07/03/2022.    Past Medical History:  Diagnosis Date   Anemia    Hypertension    Irregular heart  rate    Ovarian cyst     Past Surgical History:  Procedure Laterality Date   APPENDECTOMY     BREAST ENHANCEMENT SURGERY      History reviewed. No pertinent family history.  Social History   Socioeconomic History   Marital status: Single    Spouse name: Not on file   Number of children: Not on file   Years of education: Not on file   Highest education level: Not on file  Occupational History   Not on file  Tobacco Use   Smoking status: Every Day    Packs/day: 1.00    Types: Cigarettes   Smokeless tobacco: Never  Substance and Sexual Activity   Alcohol use: Not Currently    Comment: occassional   Drug use: Never   Sexual activity: Not on file  Other Topics Concern   Not on file  Social History Narrative   Not on file   Social Determinants of Health   Financial Resource Strain: Not on file  Food Insecurity: Not on file  Transportation Needs: Not on file  Physical Activity: Not on file  Stress: Not on file  Social Connections: Not on file  Intimate Partner Violence: Not on file    Review of Systems  Constitutional: Negative.   HENT: Negative.    Eyes: Negative.  Respiratory:  Negative for shortness of breath.   Cardiovascular:  Negative for chest pain.  Gastrointestinal: Negative.   Genitourinary: Negative.   Musculoskeletal:  Positive for joint pain.  Skin: Negative.   Neurological: Negative.   Endo/Heme/Allergies: Negative.   Psychiatric/Behavioral: Negative.          Objective    BP (!) 154/111 (BP Location: Right Arm)   Pulse 81   Ht 5\' 4"  (1.626 m)   Wt 123 lb (55.8 kg)   LMP 06/25/2022   BMI 21.11 kg/m   Physical Exam Vitals reviewed.  Constitutional:      Appearance: Normal appearance.  HENT:     Head: Normocephalic and atraumatic.     Right Ear: External ear normal.     Left Ear: External ear normal.     Nose: Nose normal.     Mouth/Throat:     Mouth: Mucous membranes are moist.     Pharynx: Oropharynx is clear.  Eyes:      Extraocular Movements: Extraocular movements intact.     Conjunctiva/sclera: Conjunctivae normal.     Pupils: Pupils are equal, round, and reactive to light.  Cardiovascular:     Rate and Rhythm: Normal rate and regular rhythm.     Pulses: Normal pulses.     Heart sounds: Normal heart sounds.  Pulmonary:     Effort: Pulmonary effort is normal.     Breath sounds: Normal breath sounds.  Musculoskeletal:     Right hand: Swelling, tenderness and bony tenderness present. Decreased range of motion. Normal capillary refill.     Left hand: Bony tenderness present. No swelling. Normal range of motion. Normal capillary refill.     Cervical back: Normal range of motion and neck supple.     Comments: Edema noted right thumb, tenderness noted with range of motion testing right  Skin:    General: Skin is warm and dry.  Neurological:     General: No focal deficit present.     Mental Status: She is alert and oriented to person, place, and time.  Psychiatric:        Mood and Affect: Mood normal.        Behavior: Behavior normal.        Thought Content: Thought content normal.        Judgment: Judgment normal.      Assessment & Plan:   Problem List Items Addressed This Visit   None Visit Diagnoses     Essential hypertension    -  Primary   Relevant Medications   metoprolol tartrate (LOPRESSOR) 50 MG tablet   Other Relevant Orders   CBC with Differential/Platelet (Completed)   Comp. Metabolic Panel (12) (Completed)   Pain of right thumb       Relevant Medications   meloxicam (MOBIC) 7.5 MG tablet   Other Relevant Orders   DG Hand Complete Right   History of pancreatitis       Tobacco use disorder       Screening, lipid       Relevant Orders   Lipid panel (Completed)     1. Essential hypertension Continue current regimen.  Patient encouraged to check blood pressure at home on a daily basis, keep a written log and have available for all office visits Red flags given for prompt  reevaluation  [facility].  Patient given appointment to [facility] care at [facility].  Patient education  given on [facility] financial assistance. - CBC with Differential/Platelet; Future - Comp. Metabolic Panel (12); Future - metoprolol tartrate (LOPRESSOR) 50 MG tablet; Take 1 tablet (50 mg total) by mouth 2 (two) times daily.  Dispense: 60 tablet; Refill: 1  2. Pain of right thumb Trial meloxicam.  Patient education given on supportive care - DG Hand Complete Right; Future - meloxicam (MOBIC) 7.5 MG tablet; Take 1 tablet (7.5 mg total) by mouth daily.  Dispense: 30 tablet; Refill: 0  3. History of pancreatitis   4. Tobacco use disorder   5. Screening, lipid  - Lipid panel; Future  Patient declines my chart, no AVS printed due to no printer in screening van.  Teach back method used for   I have reviewed the patient's medical history (PMH, PSH, Social History, Family History, Medications, and allergies) , and have been updated if relevant. I spent 30 minutes reviewing chart and  face to face time with patient.     Return in about 1 day (around 07/04/2022) for Fasting  labs, At [facility].   Kasandra Knudsen Mayers, PA-C

## 2022-07-04 ENCOUNTER — Ambulatory Visit: Payer: Medicaid Other | Attending: Family Medicine

## 2022-07-04 DIAGNOSIS — I10 Essential (primary) hypertension: Secondary | ICD-10-CM

## 2022-07-04 DIAGNOSIS — Z13220 Encounter for screening for lipoid disorders: Secondary | ICD-10-CM

## 2022-07-05 DIAGNOSIS — F17200 Nicotine dependence, unspecified, uncomplicated: Secondary | ICD-10-CM | POA: Insufficient documentation

## 2022-07-05 DIAGNOSIS — I10 Essential (primary) hypertension: Secondary | ICD-10-CM | POA: Insufficient documentation

## 2022-07-05 DIAGNOSIS — Z8719 Personal history of other diseases of the digestive system: Secondary | ICD-10-CM | POA: Insufficient documentation

## 2022-07-05 LAB — CBC WITH DIFFERENTIAL/PLATELET
Basophils Absolute: 0 10*3/uL (ref 0.0–0.2)
Basos: 0 %
EOS (ABSOLUTE): 0.1 10*3/uL (ref 0.0–0.4)
Eos: 1 %
Hematocrit: 39.4 % (ref 34.0–46.6)
Hemoglobin: 14.2 g/dL (ref 11.1–15.9)
Immature Grans (Abs): 0 10*3/uL (ref 0.0–0.1)
Immature Granulocytes: 0 %
Lymphocytes Absolute: 2.7 10*3/uL (ref 0.7–3.1)
Lymphs: 30 %
MCH: 36.2 pg — ABNORMAL HIGH (ref 26.6–33.0)
MCHC: 36 g/dL — ABNORMAL HIGH (ref 31.5–35.7)
MCV: 101 fL — ABNORMAL HIGH (ref 79–97)
Monocytes Absolute: 0.8 10*3/uL (ref 0.1–0.9)
Monocytes: 9 %
Neutrophils Absolute: 5.4 10*3/uL (ref 1.4–7.0)
Neutrophils: 60 %
Platelets: 211 10*3/uL (ref 150–450)
RBC: 3.92 x10E6/uL (ref 3.77–5.28)
RDW: 11.8 % (ref 11.7–15.4)
WBC: 9 10*3/uL (ref 3.4–10.8)

## 2022-07-05 LAB — COMP. METABOLIC PANEL (12)
AST: 23 IU/L (ref 0–40)
Albumin/Globulin Ratio: 1.9 (ref 1.2–2.2)
Albumin: 4.6 g/dL (ref 3.9–4.9)
Alkaline Phosphatase: 63 IU/L (ref 44–121)
BUN/Creatinine Ratio: 20 (ref 9–23)
BUN: 13 mg/dL (ref 6–24)
Bilirubin Total: 0.4 mg/dL (ref 0.0–1.2)
Calcium: 10 mg/dL (ref 8.7–10.2)
Chloride: 98 mmol/L (ref 96–106)
Creatinine, Ser: 0.66 mg/dL (ref 0.57–1.00)
Globulin, Total: 2.4 g/dL (ref 1.5–4.5)
Glucose: 89 mg/dL (ref 70–99)
Potassium: 4.3 mmol/L (ref 3.5–5.2)
Sodium: 138 mmol/L (ref 134–144)
Total Protein: 7 g/dL (ref 6.0–8.5)
eGFR: 111 mL/min/{1.73_m2} (ref >59)

## 2022-07-05 LAB — LIPID PANEL
Chol/HDL Ratio: 1.8 ratio (ref 0.0–4.4)
Cholesterol, Total: 196 mg/dL (ref 100–199)
HDL: 107 mg/dL (ref >39)
LDL Chol Calc (NIH): 78 mg/dL (ref 0–99)
Triglycerides: 62 mg/dL (ref 0–149)
VLDL Cholesterol Cal: 11 mg/dL (ref 5–40)

## 2022-07-09 ENCOUNTER — Telehealth: Payer: Self-pay

## 2022-07-09 NOTE — Telephone Encounter (Signed)
Pt given lab results per notes of Cari, PA on 07/09/22. Pt verbalized understanding.

## 2022-07-09 NOTE — Telephone Encounter (Signed)
Second call, Lvm for patient to call office back to receive her recent lab results

## 2022-07-09 NOTE — Telephone Encounter (Signed)
-----   Message from Melanie Moreno, [place] sent at 07/09/2022 10:33 AM EDT ----- Please call patient and let her know that her kidney function and liver function are within normal limits, she does not show signs of anemia.  Her cholesterol is also within normal limits.

## 2022-07-12 ENCOUNTER — Telehealth: Payer: Self-pay

## 2022-07-12 NOTE — Telephone Encounter (Signed)
2nd attempt . Called Lvm informing patient Im trying reach to go over recent lab results

## 2022-07-12 NOTE — Telephone Encounter (Signed)
-----   Message from Cari S Mayers, PA-C sent at 07/09/2022 10:33 AM EDT ----- Please call patient and let her know that her kidney function and liver function are within normal limits, she does not show signs of anemia.  Her cholesterol is also within normal limits.   

## 2022-07-12 NOTE — Telephone Encounter (Signed)
Made several attempts to contact patient in regards to recent labs per provider. A Vm was left informing patient, that labs were within normal limits and no signs of amenia is detected. A call back number was left if she has any questions

## 2022-07-20 ENCOUNTER — Other Ambulatory Visit: Payer: Self-pay

## 2022-07-20 ENCOUNTER — Encounter (HOSPITAL_BASED_OUTPATIENT_CLINIC_OR_DEPARTMENT_OTHER): Payer: Self-pay

## 2022-07-20 ENCOUNTER — Inpatient Hospital Stay (HOSPITAL_BASED_OUTPATIENT_CLINIC_OR_DEPARTMENT_OTHER)
Admission: EM | Admit: 2022-07-20 | Discharge: 2022-07-25 | DRG: 440 | Disposition: A | Discharge status: Home / Self Care | Payer: Self-pay | Attending: Internal Medicine | Admitting: Internal Medicine

## 2022-07-20 DIAGNOSIS — Z791 Long term (current) use of non-steroidal anti-inflammatories (NSAID): Secondary | ICD-10-CM

## 2022-07-20 DIAGNOSIS — G43009 Migraine without aura, not intractable, without status migrainosus: Secondary | ICD-10-CM | POA: Diagnosis not present

## 2022-07-20 DIAGNOSIS — E876 Hypokalemia: Secondary | ICD-10-CM | POA: Diagnosis not present

## 2022-07-20 DIAGNOSIS — F17210 Nicotine dependence, cigarettes, uncomplicated: Secondary | ICD-10-CM | POA: Diagnosis present

## 2022-07-20 DIAGNOSIS — I10 Essential (primary) hypertension: Secondary | ICD-10-CM | POA: Diagnosis present

## 2022-07-20 DIAGNOSIS — Z885 Allergy status to narcotic agent status: Secondary | ICD-10-CM

## 2022-07-20 DIAGNOSIS — F17200 Nicotine dependence, unspecified, uncomplicated: Secondary | ICD-10-CM | POA: Diagnosis present

## 2022-07-20 DIAGNOSIS — Z79899 Other long term (current) drug therapy: Secondary | ICD-10-CM

## 2022-07-20 DIAGNOSIS — G4700 Insomnia, unspecified: Secondary | ICD-10-CM | POA: Diagnosis not present

## 2022-07-20 DIAGNOSIS — K2970 Gastritis, unspecified, without bleeding: Secondary | ICD-10-CM | POA: Diagnosis present

## 2022-07-20 DIAGNOSIS — K2980 Duodenitis without bleeding: Secondary | ICD-10-CM | POA: Diagnosis present

## 2022-07-20 DIAGNOSIS — F1010 Alcohol abuse, uncomplicated: Secondary | ICD-10-CM | POA: Diagnosis present

## 2022-07-20 DIAGNOSIS — E162 Hypoglycemia, unspecified: Secondary | ICD-10-CM | POA: Diagnosis not present

## 2022-07-20 DIAGNOSIS — K852 Alcohol induced acute pancreatitis without necrosis or infection: Principal | ICD-10-CM | POA: Diagnosis present

## 2022-07-20 LAB — CBC
HCT: 40.6 % (ref 36.0–46.0)
Hemoglobin: 13.9 g/dL (ref 12.0–15.0)
MCH: 35 pg — ABNORMAL HIGH (ref 26.0–34.0)
MCHC: 34.2 g/dL (ref 30.0–36.0)
MCV: 102.3 fL — ABNORMAL HIGH (ref 80.0–100.0)
Platelets: 206 10*3/uL (ref 150–400)
RBC: 3.97 MIL/uL (ref 3.87–5.11)
RDW: 12.1 % (ref 11.5–15.5)
WBC: 9.2 10*3/uL (ref 4.0–10.5)
nRBC: 0 % (ref 0.0–0.2)

## 2022-07-20 LAB — COMPREHENSIVE METABOLIC PANEL
ALT: 61 U/L — ABNORMAL HIGH (ref 0–44)
AST: 60 U/L — ABNORMAL HIGH (ref 15–41)
Albumin: 4.4 g/dL (ref 3.5–5.0)
Alkaline Phosphatase: 67 U/L (ref 38–126)
Anion gap: 9 (ref 5–15)
BUN: 15 mg/dL (ref 6–20)
CO2: 27 mmol/L (ref 22–32)
Calcium: 9.7 mg/dL (ref 8.9–10.3)
Chloride: 102 mmol/L (ref 98–111)
Creatinine, Ser: 0.65 mg/dL (ref 0.44–1.00)
GFR, Estimated: >60 mL/min (ref >60)
Glucose, Bld: 92 mg/dL (ref 70–99)
Potassium: 4.1 mmol/L (ref 3.5–5.1)
Sodium: 138 mmol/L (ref 135–145)
Total Bilirubin: 0.4 mg/dL (ref 0.3–1.2)
Total Protein: 7.9 g/dL (ref 6.5–8.1)

## 2022-07-20 LAB — URINALYSIS, ROUTINE W REFLEX MICROSCOPIC
Glucose, UA: NEGATIVE mg/dL
Hgb urine dipstick: NEGATIVE
Ketones, ur: NEGATIVE mg/dL
Leukocytes,Ua: NEGATIVE
Nitrite: NEGATIVE
Protein, ur: 30 mg/dL — AB
Specific Gravity, Urine: >=1.03 (ref 1.005–1.030)
pH: 6 (ref 5.0–8.0)

## 2022-07-20 LAB — PREGNANCY, URINE: Preg Test, Ur: NEGATIVE

## 2022-07-20 LAB — LIPASE, BLOOD: Lipase: 104 U/L — ABNORMAL HIGH (ref 11–51)

## 2022-07-20 LAB — URINALYSIS, MICROSCOPIC (REFLEX): WBC, UA: NONE SEEN WBC/hpf (ref 0–5)

## 2022-07-20 NOTE — ED Triage Notes (Signed)
Pt ambulatory to triage with c/o LUQ pain that radiates to back and up to chest since yesterday. Pt reports she feels like pancreatitis and has hx of same. Pt reports last used alcohol 3 days ago. Pt endorses some nausea and reports one episode of emesis this morning.

## 2022-07-21 ENCOUNTER — Other Ambulatory Visit: Payer: Self-pay

## 2022-07-21 ENCOUNTER — Emergency Department (HOSPITAL_BASED_OUTPATIENT_CLINIC_OR_DEPARTMENT_OTHER): Payer: Self-pay

## 2022-07-21 ENCOUNTER — Encounter (HOSPITAL_COMMUNITY): Payer: Self-pay

## 2022-07-21 DIAGNOSIS — I10 Essential (primary) hypertension: Secondary | ICD-10-CM

## 2022-07-21 DIAGNOSIS — F17200 Nicotine dependence, unspecified, uncomplicated: Secondary | ICD-10-CM

## 2022-07-21 DIAGNOSIS — K852 Alcohol induced acute pancreatitis without necrosis or infection: Secondary | ICD-10-CM | POA: Diagnosis present

## 2022-07-21 LAB — COMPREHENSIVE METABOLIC PANEL
ALT: 45 U/L — ABNORMAL HIGH (ref 0–44)
AST: 38 U/L (ref 15–41)
Albumin: 3.4 g/dL — ABNORMAL LOW (ref 3.5–5.0)
Alkaline Phosphatase: 48 U/L (ref 38–126)
Anion gap: 6 (ref 5–15)
BUN: 11 mg/dL (ref 6–20)
CO2: 25 mmol/L (ref 22–32)
Calcium: 8.9 mg/dL (ref 8.9–10.3)
Chloride: 105 mmol/L (ref 98–111)
Creatinine, Ser: 0.53 mg/dL (ref 0.44–1.00)
GFR, Estimated: >60 mL/min (ref >60)
Glucose, Bld: 82 mg/dL (ref 70–99)
Potassium: 3.9 mmol/L (ref 3.5–5.1)
Sodium: 136 mmol/L (ref 135–145)
Total Bilirubin: 0.8 mg/dL (ref 0.3–1.2)
Total Protein: 6.2 g/dL — ABNORMAL LOW (ref 6.5–8.1)

## 2022-07-21 LAB — RAPID URINE DRUG SCREEN, HOSP PERFORMED
Amphetamines: NOT DETECTED
Barbiturates: NOT DETECTED
Benzodiazepines: NOT DETECTED
Cocaine: NOT DETECTED
Opiates: POSITIVE — AB
Tetrahydrocannabinol: NOT DETECTED

## 2022-07-21 LAB — CBC WITH DIFFERENTIAL/PLATELET
Abs Immature Granulocytes: 0.02 10*3/uL (ref 0.00–0.07)
Basophils Absolute: 0 10*3/uL (ref 0.0–0.1)
Basophils Relative: 0 %
Eosinophils Absolute: 0.2 10*3/uL (ref 0.0–0.5)
Eosinophils Relative: 2 %
HCT: 38.4 % (ref 36.0–46.0)
Hemoglobin: 12.7 g/dL (ref 12.0–15.0)
Immature Granulocytes: 0 %
Lymphocytes Relative: 42 %
Lymphs Abs: 3.3 10*3/uL (ref 0.7–4.0)
MCH: 35.1 pg — ABNORMAL HIGH (ref 26.0–34.0)
MCHC: 33.1 g/dL (ref 30.0–36.0)
MCV: 106.1 fL — ABNORMAL HIGH (ref 80.0–100.0)
Monocytes Absolute: 0.7 10*3/uL (ref 0.1–1.0)
Monocytes Relative: 9 %
Neutro Abs: 3.8 10*3/uL (ref 1.7–7.7)
Neutrophils Relative %: 47 %
Platelets: 175 10*3/uL (ref 150–400)
RBC: 3.62 MIL/uL — ABNORMAL LOW (ref 3.87–5.11)
RDW: 12.1 % (ref 11.5–15.5)
WBC: 8 10*3/uL (ref 4.0–10.5)
nRBC: 0 % (ref 0.0–0.2)

## 2022-07-21 LAB — LIPASE, BLOOD: Lipase: 130 U/L — ABNORMAL HIGH (ref 11–51)

## 2022-07-21 LAB — HIV ANTIBODY (ROUTINE TESTING W REFLEX): HIV Screen 4th Generation wRfx: NONREACTIVE

## 2022-07-21 LAB — PHOSPHORUS: Phosphorus: 2.9 mg/dL (ref 2.5–4.6)

## 2022-07-21 LAB — MAGNESIUM: Magnesium: 1.6 mg/dL — ABNORMAL LOW (ref 1.7–2.4)

## 2022-07-21 LAB — ETHANOL: Alcohol, Ethyl (B): <10 mg/dL (ref <10)

## 2022-07-21 MED ORDER — SODIUM CHLORIDE 0.9 % IV BOLUS
1000.0000 mL | Freq: Once | INTRAVENOUS | Status: AC
  Administered 2022-07-21: 1000 mL via INTRAVENOUS

## 2022-07-21 MED ORDER — IOHEXOL 300 MG/ML  SOLN
100.0000 mL | Freq: Once | INTRAMUSCULAR | Status: AC | PRN
  Administered 2022-07-21: 80 mL via INTRAVENOUS

## 2022-07-21 MED ORDER — FENTANYL 12 MCG/HR TD PT72
1.0000 | MEDICATED_PATCH | TRANSDERMAL | Status: DC
  Administered 2022-07-21: 1 via TRANSDERMAL
  Filled 2022-07-21: qty 1

## 2022-07-21 MED ORDER — MORPHINE SULFATE (PF) 4 MG/ML IV SOLN
4.0000 mg | Freq: Once | INTRAVENOUS | Status: AC
  Administered 2022-07-21: 4 mg via INTRAVENOUS
  Filled 2022-07-21: qty 1

## 2022-07-21 MED ORDER — ONDANSETRON HCL 4 MG PO TABS: 4.0000 mg | ORAL_TABLET | Freq: Four times a day (QID) | ORAL | Status: DC | PRN

## 2022-07-21 MED ORDER — ACETAMINOPHEN 650 MG RE SUPP: 650.0000 mg | Freq: Four times a day (QID) | RECTAL | Status: DC | PRN

## 2022-07-21 MED ORDER — SODIUM PHOSPHATES 45 MMOLE/15ML IV SOLN
30.0000 mmol | Freq: Once | INTRAVENOUS | Status: AC
  Administered 2022-07-21: 30 mmol via INTRAVENOUS
  Filled 2022-07-21: qty 10

## 2022-07-21 MED ORDER — METOPROLOL TARTRATE 5 MG/5ML IV SOLN
5.0000 mg | Freq: Once | INTRAVENOUS | Status: AC
  Administered 2022-07-21: 5 mg via INTRAVENOUS
  Filled 2022-07-21: qty 5

## 2022-07-21 MED ORDER — MORPHINE SULFATE (PF) 2 MG/ML IV SOLN
2.0000 mg | INTRAVENOUS | Status: DC | PRN
  Administered 2022-07-21 – 2022-07-23 (×9): 4 mg via INTRAVENOUS
  Filled 2022-07-21 (×9): qty 2

## 2022-07-21 MED ORDER — OXYCODONE HCL 5 MG PO TABS
5.0000 mg | ORAL_TABLET | Freq: Four times a day (QID) | ORAL | Status: DC | PRN
  Administered 2022-07-21: 5 mg via ORAL
  Filled 2022-07-21: qty 1

## 2022-07-21 MED ORDER — PANTOPRAZOLE SODIUM 40 MG IV SOLR
40.0000 mg | Freq: Two times a day (BID) | INTRAVENOUS | Status: DC
  Administered 2022-07-21 – 2022-07-24 (×8): 40 mg via INTRAVENOUS
  Filled 2022-07-21 (×9): qty 10

## 2022-07-21 MED ORDER — NICOTINE POLACRILEX 2 MG MT GUM: 2.0000 mg | CHEWING_GUM | OROMUCOSAL | Status: DC | PRN

## 2022-07-21 MED ORDER — OXYCODONE HCL 5 MG PO TABS
5.0000 mg | ORAL_TABLET | ORAL | Status: DC | PRN
  Administered 2022-07-21: 5 mg via ORAL
  Filled 2022-07-21: qty 1

## 2022-07-21 MED ORDER — METOPROLOL TARTRATE 50 MG PO TABS
50.0000 mg | ORAL_TABLET | Freq: Two times a day (BID) | ORAL | Status: DC
  Administered 2022-07-21 – 2022-07-25 (×8): 50 mg via ORAL
  Filled 2022-07-21 (×10): qty 1

## 2022-07-21 MED ORDER — ONDANSETRON HCL 4 MG/2ML IJ SOLN
4.0000 mg | Freq: Once | INTRAMUSCULAR | Status: AC
  Administered 2022-07-21: 4 mg via INTRAVENOUS
  Filled 2022-07-21: qty 2

## 2022-07-21 MED ORDER — SODIUM CHLORIDE 0.9 % IV SOLN: INTRAVENOUS | Status: DC

## 2022-07-21 MED ORDER — NALOXONE HCL 0.4 MG/ML IJ SOLN: 0.4000 mg | INTRAMUSCULAR | Status: DC | PRN

## 2022-07-21 MED ORDER — POTASSIUM CHLORIDE 10 MEQ/100ML IV SOLN
10.0000 meq | Freq: Once | INTRAVENOUS | Status: AC
  Administered 2022-07-21: 10 meq via INTRAVENOUS
  Filled 2022-07-21: qty 100

## 2022-07-21 MED ORDER — ONDANSETRON HCL 4 MG/2ML IJ SOLN
4.0000 mg | Freq: Four times a day (QID) | INTRAMUSCULAR | Status: DC | PRN
  Administered 2022-07-21: 4 mg via INTRAVENOUS
  Filled 2022-07-21: qty 2

## 2022-07-21 MED ORDER — KETOROLAC TROMETHAMINE 30 MG/ML IJ SOLN
30.0000 mg | Freq: Once | INTRAMUSCULAR | Status: AC
  Administered 2022-07-21: 30 mg via INTRAVENOUS
  Filled 2022-07-21: qty 1

## 2022-07-21 MED ORDER — ACETAMINOPHEN 325 MG PO TABS
650.0000 mg | ORAL_TABLET | Freq: Four times a day (QID) | ORAL | Status: DC | PRN
  Administered 2022-07-22 – 2022-07-24 (×4): 650 mg via ORAL
  Filled 2022-07-21 (×4): qty 2

## 2022-07-21 NOTE — Plan of Care (Signed)
  Problem: Education: Goal: Knowledge of General Education information will improve Description Including pain rating scale, medication(s)/side effects and non-pharmacologic comfort measures Outcome: Progressing   

## 2022-07-21 NOTE — Progress Notes (Signed)
Melanie Moreno TKW:[id number] DOB: 10/23/1975 DOA: 07/20/2022 PCP: Sandi Mariscal, MD   Subj: CC: abd pain HPI: 46 year old WF PMHx Essential HTN, tobacco abuse, EtOH abuse,  presents to the ER today with onset of abdominal pain 2 days ago.  Patient states that she went out with her children to help celebrate her upcoming 46th birthday.  She drank excessively 2 days ago woke up with severe abdominal pain.  She has been nauseous and vomiting for the last 24 hours.  Came to the ER for evaluation.  Lipase minimally elevated to 104.  Mild pancreatic head edema.  Patient refused to be discharged.   Obj: A/O x4, does not have PCP but has appointment 22 November for establish care care.  Does not have endocrinologist.  Began to have flares of pancreatitis 3 years ago.    Objective: VITAL SIGNS: Temp: 98 F (36.7 C) (09/30 0532) Temp Source: Oral (09/30 0532) BP: 173/116 (09/30 0801) Pulse Rate: 62 (09/30 0801) SPO2; FIO2:   Intake/Output Summary (Last 24 hours) at 07/21/2022 0823 Last data filed at 07/21/2022 0600 Gross per 24 hour  Intake 1227.01 ml  Output --  Net 1227.01 ml     Exam: Evaluated by [facility] physician 9/30 no charge .   Mobility Assessment (last 72 hours)     Mobility Assessment     Row Name 07/21/22 0802 07/21/22 0535         Does patient have an order for bedrest or is patient medically unstable No - Continue assessment No - Continue assessment      What is the highest level of mobility based on the progressive mobility assessment? Level 6 (Walks independently in room and hall) - Balance while walking in room without assist - Complete Level 6 (Walks independently in room and hall) - Balance while walking in room without assist - Complete                 DVT prophylaxis: SCD Code Status: Full Family Communication:  Status is: Inpatient    Dispo: The patient is from:               Anticipated d/c is to:               Anticipated d/c date is:                Patient currently     Procedures/Significant Events:    Consultants:     Cultures   Antimicrobials:   A/P  Acute EtOH Pancreatitis Observation medical bed.  Due to excessive alcohol use.  Continue with IV Protonix.  Would minimize opiate use.  Continue IV fluids.  N.p.o. except ice chips.  Repeat lipase this afternoon.  Hopefully discharge in 24 hours. Lab Results  Component Value Date   LIPASE 130 (H) 07/21/2022   LIPASE 104 (H) 07/20/2022   LIPASE 27 06/05/2020   LIPASE 28 09/28/2018   LIPASE 31 05/17/2018     Tobacco abuse Patient smokes 1 pack a day.  Declines nicotine patch.   Essential HTN Continue Lopressor 50 mg twice daily.        Care during the described time interval was provided by me .  I have reviewed this patient's available data, including medical history, events of note, physical examination, and all test results as part of my evaluation.

## 2022-07-21 NOTE — Assessment & Plan Note (Signed)
Observation medical bed.  Due to excessive alcohol use.  Continue with IV Protonix.  Would minimize opiate use.  Continue IV fluids.  N.p.o. except ice chips.  Repeat lipase this afternoon.  Hopefully discharge in 24 hours.

## 2022-07-21 NOTE — Assessment & Plan Note (Signed)
Continue Lopressor 50 mg twice daily.

## 2022-07-21 NOTE — Progress Notes (Signed)
Melanie Moreno is a 46 y.o. F with HTN, alcohol related pancreatitis and gastritis who presented with 1 day abdominal pain and vomiting.    In the ER, lipase 104, AST/ALT 60/61.  CT shows acute pancreatitis, gastritis and duodenitis. Endorsed alcohol to Dr. Stark Jock.  Started on fluids, NPO.  Admitted to med surg.

## 2022-07-21 NOTE — H&P (Signed)
History and Physical    Melanie Moreno [id number] DOB: 07-07-1976 DOA: 07/20/2022  DOS: the patient was seen and examined on 07/20/2022  PCP: Sandi Mariscal, MD   Patient coming from: Home  I have personally briefly reviewed patient's old medical records in [place]  CC: abd pain HPI: 46 year old female history of hypertension, tobacco abuse, alcohol abuse presents to the ER today with onset of abdominal pain 2 days ago.  Patient states that she went out with her children to help celebrate her upcoming 46th birthday.  She drank excessively 2 days ago woke up with severe abdominal pain.  She has been nauseous and vomiting for the last 24 hours.  Came to the ER for evaluation.  Lipase minimally elevated to 104.  Mild pancreatic head edema.  Patient refused to be discharged.  Patient transferred to [facility] for admission.   ED Course: Minimal lipase elevation to 104.  Mild pancreatic head inflammation on CT.  Review of Systems:  Review of Systems  Constitutional: Negative.   HENT: Negative.    Eyes: Negative.   Respiratory: Negative.    Cardiovascular: Negative.   Gastrointestinal:  Positive for abdominal pain, nausea and vomiting.  Genitourinary: Negative.   Musculoskeletal: Negative.   Skin: Negative.   Neurological: Negative.   Endo/Heme/Allergies: Negative.   Psychiatric/Behavioral:  Positive for substance abuse.   All other systems reviewed and are negative.   Past Medical History:  Diagnosis Date   Anemia    Hypertension    Irregular heart rate    Ovarian cyst     Past Surgical History:  Procedure Laterality Date   APPENDECTOMY     BREAST ENHANCEMENT SURGERY       reports that she has been smoking cigarettes. She has been smoking an average of 1 pack per day. She has never used smokeless tobacco. She reports that she does not currently use alcohol. She reports that she does not use drugs.  Allergies  Allergen Reactions   Dilaudid [Hydromorphone Hcl]  Other (See Comments)    headaches    History reviewed. No pertinent family history.  Prior to Admission medications   Medication Sig Start Date End Date Taking? Authorizing Provider  cyclobenzaprine (FLEXERIL) 10 MG tablet Take 1 tablet (10 mg total) by mouth 2 (two) times daily as needed for muscle spasms. 09/26/19   Domenic Moras, PA-C  ibuprofen (ADVIL) 600 MG tablet Take 1 tablet (600 mg total) by mouth every 6 (six) hours as needed. 09/26/19   Domenic Moras, PA-C  meloxicam (MOBIC) 7.5 MG tablet Take 1 tablet (7.5 mg total) by mouth daily. 07/03/22   Mayers, Cari S, PA-C  metoprolol tartrate (LOPRESSOR) 50 MG tablet Take 1 tablet (50 mg total) by mouth 2 (two) times daily. 07/03/22   Mayers, Cari S, PA-C  omeprazole (PRILOSEC) 20 MG capsule Take 1 capsule (20 mg total) by mouth 2 (two) times daily before a meal. 06/05/20   Domenic Moras, PA-C  sucralfate (CARAFATE) 1 g tablet Take 1 tablet (1 g total) by mouth 4 (four) times daily -  with meals and at bedtime. 06/05/20   Domenic Moras, PA-C    Physical Exam: Vitals:   07/21/22 0146 07/21/22 0230 07/21/22 0400 07/21/22 0532  BP: (!) 167/98 (!) 177/159 (!) 156/95 (!) 181/106  Pulse: 60 68 73 73  Resp: 18 18 (!) 29 (!) 22  Temp:    98 F (36.7 C)  TempSrc:    Oral  SpO2: 100% 100% 93% 100%  Weight:      Height:        Physical Exam Vitals and nursing note reviewed.  Constitutional:      General: She is not in acute distress.    Appearance: Normal appearance. She is normal weight. She is not ill-appearing, toxic-appearing or diaphoretic.  HENT:     Head: Normocephalic and atraumatic.     Nose: Nose normal.  Cardiovascular:     Rate and Rhythm: Normal rate and regular rhythm.     Pulses: Normal pulses.  Pulmonary:     Effort: Pulmonary effort is normal. No respiratory distress.     Breath sounds: Normal breath sounds. No wheezing or rales.  Abdominal:     General: Abdomen is flat. Bowel sounds are normal. There is no distension.      Palpations: Abdomen is soft.     Tenderness: There is abdominal tenderness in the epigastric area. There is no guarding or rebound.  Musculoskeletal:     Right hand: Swelling present.     Right lower leg: No edema.     Left lower leg: No edema.     Comments: Swelling at 1st MCP.   Skin:    General: Skin is warm and dry.     Capillary Refill: Capillary refill takes less than 2 seconds.  Neurological:     General: No focal deficit present.     Mental Status: She is alert and oriented to person, place, and time.      Labs on Admission: I have personally reviewed following labs and imaging studies  CBC: Recent Labs  Lab 07/20/22 2241  WBC 9.2  HGB 13.9  HCT 40.6  MCV 102.3*  PLT 99991111   Basic Metabolic Panel: Recent Labs  Lab 07/20/22 2241  NA 138  K 4.1  CL 102  CO2 27  GLUCOSE 92  BUN 15  CREATININE 0.65  CALCIUM 9.7   GFR: Estimated Creatinine Clearance: 77.5 mL/min (by C-G formula based on SCr of 0.65 mg/dL). Liver Function Tests: Recent Labs  Lab 07/20/22 2241  AST 60*  ALT 61*  ALKPHOS 67  BILITOT 0.4  PROT 7.9  ALBUMIN 4.4   Recent Labs  Lab 07/20/22 2241  LIPASE 104*   No results for input(s): "AMMONIA" in the last 168 hours. Coagulation Profile: No results for input(s): "INR", "PROTIME" in the last 168 hours. Cardiac Enzymes: No results for input(s): "CKTOTAL", "CKMB", "CKMBINDEX", "TROPONINI", "TROPONINIHS" in the last 168 hours. BNP (last 3 results) No results for input(s): "PROBNP" in the last 8760 hours. HbA1C: No results for input(s): "HGBA1C" in the last 72 hours. CBG: No results for input(s): "GLUCAP" in the last 168 hours. Lipid Profile: No results for input(s): "CHOL", "HDL", "LDLCALC", "TRIG", "CHOLHDL", "LDLDIRECT" in the last 72 hours. Thyroid Function Tests: No results for input(s): "TSH", "T4TOTAL", "FREET4", "T3FREE", "THYROIDAB" in the last 72 hours. Anemia Panel: No results for input(s): "VITAMINB12", "FOLATE", "FERRITIN",  "TIBC", "IRON", "RETICCTPCT" in the last 72 hours. Urine analysis:    Component Value Date/Time   COLORURINE YELLOW 07/20/2022 2241   APPEARANCEUR CLOUDY (A) 07/20/2022 2241   LABSPEC >=1.030 07/20/2022 2241   PHURINE 6.0 07/20/2022 2241   GLUCOSEU NEGATIVE 07/20/2022 2241   HGBUR NEGATIVE 07/20/2022 2241   BILIRUBINUR SMALL (A) 07/20/2022 2241   KETONESUR NEGATIVE 07/20/2022 2241   PROTEINUR 30 (A) 07/20/2022 2241   UROBILINOGEN 0.2 01/19/2015 1808   NITRITE NEGATIVE 07/20/2022 2241   LEUKOCYTESUR NEGATIVE 07/20/2022 2241    Radiological Exams on  Admission: I have personally reviewed images CT ABDOMEN PELVIS W CONTRAST  Result Date: 07/21/2022 CLINICAL DATA:  Left upper quadrant pain radiating to the back; feels like past episodes of pancreatitis. EXAM: CT ABDOMEN AND PELVIS WITH CONTRAST TECHNIQUE: Multidetector CT imaging of the abdomen and pelvis was performed using the standard protocol following bolus administration of intravenous contrast. RADIATION DOSE REDUCTION: This exam was performed according to the departmental dose-optimization program which includes automated exposure control, adjustment of the mA and/or kV according to patient size and/or use of iterative reconstruction technique. CONTRAST:  66mL OMNIPAQUE IOHEXOL 300 MG/ML  SOLN COMPARISON:  CT abdomen and pelvis 10/18/2021 FINDINGS: Lower chest: No acute abnormality. Hepatobiliary: Hepatic steatosis. No suspicious hepatic lesion. Hyperenhancement of the common bile duct is likely reactive due to pancreatitis. No biliary ductal dilation. Pancreas: Hazy fluid about the head of the pancreas compatible with acute pancreatitis. The pancreatic parenchyma is normally enhancing. No pseudocyst formation. No pancreatic duct dilation. Spleen: Unremarkable. Adrenals/Urinary Tract: Unremarkable adrenal glands. Prompt symmetric nephrograms. No urinary calculi or hydronephrosis. Stomach/Bowel: Marked wall thickening of the gastric antrum.  Additional wall thickening of the 1st-3rd portions of the duodenum. Normal caliber large and small bowel. The appendix is not visualized. Vascular/Lymphatic: Predominantly non calcified aortic atherosclerosis. No suspicious adenopathy. Reproductive: Uterus and bilateral adnexa are unremarkable. Other: No free intraperitoneal air. Musculoskeletal: No acute or significant osseous findings. IMPRESSION: Acute pancreatitis of the pancreatic head. No evidence of pancreatic necrosis or pseudocyst formation. Gastritis and duodenitis may be due to peptic ulcer disease or reactive from adjacent pancreatitis. Aortic Atherosclerosis (ICD10-I70.0). Electronically Signed   By: Placido Sou M.D.   On: 07/21/2022 03:03    EKG: My personal interpretation of EKG shows: NSR    Assessment/Plan Principal Problem:   Acute alcoholic pancreatitis Active Problems:   Essential hypertension   Tobacco use disorder    Assessment and Plan: * Acute alcoholic pancreatitis Observation medical bed.  Due to excessive alcohol use.  Continue with IV Protonix.  Would minimize opiate use.  Continue IV fluids.  N.p.o. except ice chips.  Repeat lipase this afternoon.  Hopefully discharge in 24 hours.  Tobacco use disorder Patient smokes 1 pack a day.  Declines nicotine patch.  Essential hypertension Continue Lopressor 50 mg twice daily.   DVT prophylaxis: SCDs Code Status: Full Code Family Communication: no family at bedside  Disposition Plan: return home  Consults called: none  Admission status: Observation, Med-Surg   Kristopher Oppenheim, DO [facility] 07/21/2022, 6:11 AM

## 2022-07-21 NOTE — ED Notes (Signed)
SBAR Report given to [place], [place]

## 2022-07-21 NOTE — ED Provider Notes (Signed)
[facility] EMERGENCY DEPARTMENT Provider Note   CSN: [id number] Arrival date & time: 07/20/22  2210     History  Chief Complaint  Patient presents with   Abdominal Pain    Melanie Moreno is a 46 y.o. female.  Patient is a 46 year old female with history of hypertension and alcohol induced pancreatitis.  Patient presenting today with complaints of epigastric pain, nausea.  This started yesterday and feels like her similar episodes of pancreatitis.  She does report alcohol consumption 2 days ago while celebrating her upcoming birthday.  She denies any diarrhea or constipation.  She denies fevers or chills.  Pain worse with movement and palpation with no alleviating factors.  The history is provided by the patient.       Home Medications Prior to Admission medications   Medication Sig Start Date End Date Taking? Authorizing Provider  cyclobenzaprine (FLEXERIL) 10 MG tablet Take 1 tablet (10 mg total) by mouth 2 (two) times daily as needed for muscle spasms. 09/26/19   Domenic Moras, PA-C  ibuprofen (ADVIL) 600 MG tablet Take 1 tablet (600 mg total) by mouth every 6 (six) hours as needed. 09/26/19   Domenic Moras, PA-C  meloxicam (MOBIC) 7.5 MG tablet Take 1 tablet (7.5 mg total) by mouth daily. 07/03/22   Mayers, Cari S, PA-C  metoprolol tartrate (LOPRESSOR) 50 MG tablet Take 1 tablet (50 mg total) by mouth 2 (two) times daily. 07/03/22   Mayers, Cari S, PA-C  omeprazole (PRILOSEC) 20 MG capsule Take 1 capsule (20 mg total) by mouth 2 (two) times daily before a meal. 06/05/20   Domenic Moras, PA-C  sucralfate (CARAFATE) 1 g tablet Take 1 tablet (1 g total) by mouth 4 (four) times daily -  with meals and at bedtime. 06/05/20   Domenic Moras, PA-C      Allergies    Dilaudid [hydromorphone hcl]    Review of Systems   Review of Systems  All other systems reviewed and are negative.   Physical Exam Updated Vital Signs BP (!) 191/103   Pulse 74   Temp 98.1 F (36.7 C) (Oral)   Resp  18   Ht 5\' 4"  (1.626 m)   Wt 55.8 kg   LMP 06/25/2022   SpO2 100%   BMI 21.11 kg/m  Physical Exam Vitals and nursing note reviewed.  Constitutional:      General: She is not in acute distress.    Appearance: She is well-developed. She is not diaphoretic.  HENT:     Head: Normocephalic and atraumatic.  Cardiovascular:     Rate and Rhythm: Normal rate and regular rhythm.     Heart sounds: No murmur heard.    No friction rub. No gallop.  Pulmonary:     Effort: Pulmonary effort is normal. No respiratory distress.     Breath sounds: Normal breath sounds. No wheezing.  Abdominal:     General: Bowel sounds are normal. There is no distension.     Palpations: Abdomen is soft.     Tenderness: There is abdominal tenderness in the epigastric area. There is no right CVA tenderness, left CVA tenderness, guarding or rebound.  Musculoskeletal:        General: Normal range of motion.     Cervical back: Normal range of motion and neck supple.  Skin:    General: Skin is warm and dry.  Neurological:     General: No focal deficit present.     Mental Status: She is alert and oriented  to person, place, and time.     ED Results / Procedures / Treatments   Labs (all labs ordered are listed, but only abnormal results are displayed) Labs Reviewed  LIPASE, BLOOD - Abnormal; Notable for the following components:      Result Value   Lipase 104 (*)    All other components within normal limits  COMPREHENSIVE METABOLIC PANEL - Abnormal; Notable for the following components:   AST 60 (*)    ALT 61 (*)    All other components within normal limits  CBC - Abnormal; Notable for the following components:   MCV 102.3 (*)    MCH 35.0 (*)    All other components within normal limits  URINALYSIS, ROUTINE W REFLEX MICROSCOPIC - Abnormal; Notable for the following components:   APPearance CLOUDY (*)    Bilirubin Urine SMALL (*)    Protein, ur 30 (*)    All other components within normal limits   URINALYSIS, MICROSCOPIC (REFLEX) - Abnormal; Notable for the following components:   Bacteria, UA RARE (*)    All other components within normal limits  PREGNANCY, URINE    EKG None  Radiology No results found.  Procedures Procedures    Medications Ordered in ED Medications  morphine (PF) 4 MG/ML injection 4 mg (has no administration in time range)  ondansetron (ZOFRAN) injection 4 mg (has no administration in time range)  sodium chloride 0.9 % bolus 1,000 mL (has no administration in time range)    ED Course/ Medical Decision Making/ A&P  Patient is a 46 year old female presenting with epigastric pain.  She has history of alcohol induced pancreatitis and admits to drinking alcohol 2 days ago.  She arrives here with stable vital signs, but with significant tenderness to palpation in the epigastric region.  Work-up initiated including CBC, CMP, lipase.  Results show an elevated lipase of 104, and potassium of 2.7.  CT scan also ordered showing inflammation around the pancreatic head consistent with pancreatitis, but no evidence for pseudocyst.  Patient has been hydrated, potassium supplement administered intravenously, and has received a total of 12 mg of morphine, but states that she is not feeling better or well enough to go home.  I have spoken with Dr. Maryfrances Bunnell who agrees to admit.  Final Clinical Impression(s) / ED Diagnoses Final diagnoses:  None    Rx / DC Orders ED Discharge Orders     None         Geoffery Lyons, MD 07/21/22 [phone]

## 2022-07-21 NOTE — Subjective & Objective (Signed)
CC: abd pain HPI: 46 year old female history of hypertension, tobacco abuse, alcohol abuse presents to the ER today with onset of abdominal pain 2 days ago.  Patient states that she went out with her children to help celebrate her upcoming 46th birthday.  She drank excessively 2 days ago woke up with severe abdominal pain.  She has been nauseous and vomiting for the last 24 hours.  Came to the ER for evaluation.  Lipase minimally elevated to 104.  Mild pancreatic head edema.  Patient refused to be discharged.  Patient transferred to [facility] for admission.

## 2022-07-21 NOTE — Assessment & Plan Note (Signed)
Patient smokes 1 pack a day.  Declines nicotine patch.

## 2022-07-21 NOTE — Hospital Course (Addendum)
Mrs. Boehlke is a 46 y.o. F with HTN, alcohol related pancreatitis and gastritis who presented with 1 day abdominal pain and vomiting.    In the ER, lipase 104, AST/ALT 60/61.  CT shows acute pancreatitis, gastritis and duodenitis.  Started on fluids, NPO.  Admitted to med surg.

## 2022-07-21 NOTE — Plan of Care (Signed)
Plan of care initiated and discussed with the patient. 

## 2022-07-22 LAB — COMPREHENSIVE METABOLIC PANEL
ALT: 44 U/L (ref 0–44)
AST: 41 U/L (ref 15–41)
Albumin: 3.4 g/dL — ABNORMAL LOW (ref 3.5–5.0)
Alkaline Phosphatase: 47 U/L (ref 38–126)
Anion gap: 8 (ref 5–15)
BUN: 8 mg/dL (ref 6–20)
CO2: 22 mmol/L (ref 22–32)
Calcium: 8.4 mg/dL — ABNORMAL LOW (ref 8.9–10.3)
Chloride: 107 mmol/L (ref 98–111)
Creatinine, Ser: 0.54 mg/dL (ref 0.44–1.00)
GFR, Estimated: >60 mL/min (ref >60)
Glucose, Bld: 74 mg/dL (ref 70–99)
Potassium: 3.3 mmol/L — ABNORMAL LOW (ref 3.5–5.1)
Sodium: 137 mmol/L (ref 135–145)
Total Bilirubin: 0.7 mg/dL (ref 0.3–1.2)
Total Protein: 5.9 g/dL — ABNORMAL LOW (ref 6.5–8.1)

## 2022-07-22 LAB — CBC WITH DIFFERENTIAL/PLATELET
Abs Immature Granulocytes: 0.02 10*3/uL (ref 0.00–0.07)
Basophils Absolute: 0 10*3/uL (ref 0.0–0.1)
Basophils Relative: 0 %
Eosinophils Absolute: 0.2 10*3/uL (ref 0.0–0.5)
Eosinophils Relative: 3 %
HCT: 36.3 % (ref 36.0–46.0)
Hemoglobin: 11.9 g/dL — ABNORMAL LOW (ref 12.0–15.0)
Immature Granulocytes: 0 %
Lymphocytes Relative: 37 %
Lymphs Abs: 2.5 10*3/uL (ref 0.7–4.0)
MCH: 35.1 pg — ABNORMAL HIGH (ref 26.0–34.0)
MCHC: 32.8 g/dL (ref 30.0–36.0)
MCV: 107.1 fL — ABNORMAL HIGH (ref 80.0–100.0)
Monocytes Absolute: 0.6 10*3/uL (ref 0.1–1.0)
Monocytes Relative: 8 %
Neutro Abs: 3.5 10*3/uL (ref 1.7–7.7)
Neutrophils Relative %: 52 %
Platelets: 151 10*3/uL (ref 150–400)
RBC: 3.39 MIL/uL — ABNORMAL LOW (ref 3.87–5.11)
RDW: 12.1 % (ref 11.5–15.5)
WBC: 6.8 10*3/uL (ref 4.0–10.5)
nRBC: 0 % (ref 0.0–0.2)

## 2022-07-22 LAB — PHOSPHORUS: Phosphorus: 4.1 mg/dL (ref 2.5–4.6)

## 2022-07-22 LAB — LIPASE, BLOOD: Lipase: 130 U/L — ABNORMAL HIGH (ref 11–51)

## 2022-07-22 LAB — GLUCOSE, CAPILLARY
Glucose-Capillary: 53 mg/dL — ABNORMAL LOW (ref 70–99)
Glucose-Capillary: 83 mg/dL (ref 70–99)
Glucose-Capillary: 120 mg/dL — ABNORMAL HIGH (ref 70–99)
Glucose-Capillary: 132 mg/dL — ABNORMAL HIGH (ref 70–99)
Glucose-Capillary: 135 mg/dL — ABNORMAL HIGH (ref 70–99)

## 2022-07-22 LAB — MAGNESIUM: Magnesium: 1.6 mg/dL — ABNORMAL LOW (ref 1.7–2.4)

## 2022-07-22 LAB — HEMOGLOBIN A1C
Hgb A1c MFr Bld: 5.5 % (ref 4.8–5.6)
Mean Plasma Glucose: 111.15 mg/dL

## 2022-07-22 MED ORDER — GLUCOSE 4 G PO CHEW
1.0000 | CHEWABLE_TABLET | Freq: Once | ORAL | Status: AC
  Administered 2022-07-22: 4 g via ORAL
  Filled 2022-07-22: qty 1

## 2022-07-22 MED ORDER — QUETIAPINE FUMARATE 50 MG PO TABS
50.0000 mg | ORAL_TABLET | Freq: Every day | ORAL | Status: DC
  Administered 2022-07-22 – 2022-07-24 (×3): 50 mg via ORAL
  Filled 2022-07-22 (×3): qty 1

## 2022-07-22 MED ORDER — HYDRALAZINE HCL 20 MG/ML IJ SOLN
10.0000 mg | Freq: Four times a day (QID) | INTRAMUSCULAR | Status: DC | PRN
  Administered 2022-07-22 – 2022-07-24 (×3): 10 mg via INTRAVENOUS
  Filled 2022-07-22 (×3): qty 1

## 2022-07-22 MED ORDER — MAGNESIUM SULFATE 50 % IJ SOLN
3.0000 g | Freq: Once | INTRAVENOUS | Status: AC
  Administered 2022-07-22: 3 g via INTRAVENOUS
  Filled 2022-07-22: qty 6

## 2022-07-22 MED ORDER — DEXTROSE 10 % IV SOLN: INTRAVENOUS | Status: DC

## 2022-07-22 MED ORDER — POTASSIUM CHLORIDE 10 MEQ/100ML IV SOLN
10.0000 meq | INTRAVENOUS | Status: DC
  Administered 2022-07-22: 10 meq via INTRAVENOUS
  Filled 2022-07-22: qty 100

## 2022-07-22 MED ORDER — POTASSIUM CHLORIDE 10 MEQ/100ML IV SOLN
10.0000 meq | INTRAVENOUS | Status: AC
  Administered 2022-07-22 (×4): 10 meq via INTRAVENOUS
  Filled 2022-07-22 (×5): qty 100

## 2022-07-22 MED ORDER — DEXTROSE-NACL 5-0.9 % IV SOLN: INTRAVENOUS | Status: DC

## 2022-07-22 MED ORDER — SODIUM CHLORIDE 0.9 % IV SOLN
25.0000 mg | Freq: Once | INTRAVENOUS | Status: AC
  Administered 2022-07-22: 25 mg via INTRAVENOUS
  Filled 2022-07-22: qty 1

## 2022-07-22 MED ORDER — SODIUM CHLORIDE 0.9 % IV SOLN: INTRAVENOUS | Status: DC | PRN

## 2022-07-22 MED ORDER — POTASSIUM CHLORIDE 10 MEQ/100ML IV SOLN
10.0000 meq | Freq: Once | INTRAVENOUS | Status: AC
  Administered 2022-07-22: 10 meq via INTRAVENOUS

## 2022-07-22 MED ORDER — INSULIN ASPART 100 UNIT/ML IJ SOLN
0.0000 [IU] | INTRAMUSCULAR | Status: DC
  Administered 2022-07-22 (×2): 1 [IU] via SUBCUTANEOUS
  Administered 2022-07-23: 2 [IU] via SUBCUTANEOUS
  Administered 2022-07-24 (×2): 1 [IU] via SUBCUTANEOUS

## 2022-07-22 NOTE — TOC Initial Note (Signed)
Transition of Care [facility]) - Initial/Assessment Note    Patient Details  Name: Melanie Moreno MRN: [id number] Date of Birth: 1976/05/05  [facility]) CM/SW Contact:    Henrietta Dine, RN Phone Number: 07/22/2022, 3:16 PM  Clinical Narrative:                 Met with pt in room; she lives with her roommate and plans to return there at discharge; she wears glasses; the pt says she has an appt with her new PCP at [facility] on 09/10/22; she says she does not have insurance; resource for Dept of Social Services added to d/c instructions; the pt smokes cigarettes/vapes, and consumes ETOH; the pt says she attended AA in the past; resources also provided in d/c instructions; she will make appts for all the previously listed resources; TOC will continue to follow.  Expected Discharge Plan: Home/Self Care Barriers to Discharge: Continued Medical Work up   Patient Goals and CMS Choice Patient states their goals for this hospitalization and ongoing recovery are:: home      Expected Discharge Plan and Services Expected Discharge Plan: Home/Self Care   Discharge Planning Services: CM Consult   Living arrangements for the past 2 months: Single Family Home                                      Prior Living Arrangements/Services Living arrangements for the past 2 months: Single Family Home Lives with:: Friends Patient language and need for interpreter reviewed:: Yes Do you feel safe going back to the place where you live?: Yes      Need for Family Participation in Patient Care: Yes (Comment) Care giver support system in place?: Yes (comment)   Criminal Activity/Legal Involvement Pertinent to Current Situation/Hospitalization: No - Comment as needed  Activities of Daily Living Home Assistive Devices/Equipment: Eyeglasses, Blood pressure cuff ADL Screening (condition at time of admission) Patient's cognitive ability adequate to safely complete daily activities?: Yes Is  the patient deaf or have difficulty hearing?: No Does the patient have difficulty seeing, even when wearing glasses/contacts?: Yes Does the patient have difficulty concentrating, remembering, or making decisions?: No Patient able to express need for assistance with ADLs?: Yes Does the patient have difficulty dressing or bathing?: No Independently performs ADLs?: Yes (appropriate for developmental age) Does the patient have difficulty walking or climbing stairs?: No Weakness of Legs: None Weakness of Arms/Hands: Both  Permission Sought/Granted Permission sought to share information with : Case Manager Permission granted to share information with : Yes, Verbal Permission Granted  Share Information with NAME: Lenor Coffin, RN, CM           Emotional Assessment Appearance:: Appears stated age Attitude/Demeanor/Rapport: Gracious Affect (typically observed): Accepting Orientation: : Oriented to Self, Oriented to Place, Oriented to  Time, Oriented to Situation Alcohol / Substance Use: Alcohol Use, Tobacco Use Psych Involvement: No (comment)  Admission diagnosis:  Acute pancreatitis [K85.90] Alcohol-induced acute pancreatitis, unspecified complication status [H74.16] Patient Active Problem List   Diagnosis Date Noted   Acute alcoholic pancreatitis 38/45/3646   Essential hypertension 07/05/2022   History of pancreatitis 07/05/2022   Tobacco use disorder 07/05/2022   Closed fracture of right distal radius 01/18/2020   PCP:  Sandi Mariscal, MD Pharmacy:   [place], [place] - [address] [place] [place] [address] Phone: [phone] Fax: [phone]  [facility]  [place] [address], [place] [address] Phone: [phone] Fax: [phone]     Social Determinants of Health (SDOH) Interventions    Readmission Risk Interventions     No data to display

## 2022-07-22 NOTE — Progress Notes (Signed)
Hypoglycemic Event  CBG: 53  Treatment: Glucose 4 g chewable tablet given at 12:15 PM  Symptoms: Shaky and Hungry  Follow-up CBG: Time: 12:36 PM CBG Result: 83  Possible Reasons for Event: Patient NPO (Per Dr. Dia Crawford, MD order)  Comments/MD notified: Patient complained of feeling "shaky." Hands were shaking when I had patient hold them out in front of me. She was also stating that she is "hungry." Had nurse tech check patient's CBG. CBG reading: 53. Notified Dr. Dia Crawford, MD about patient's CBG reading, symptoms, and requests.   New orders: 4 g of Glucose chewable tablet                      IV Dextrose 10% infusion  Patient's CBG result: 83 after consuming the chewable Glucose tablet.   Will continue to monitor patient.   Anda Kraft

## 2022-07-22 NOTE — Plan of Care (Signed)
  Problem: Education: Goal: Knowledge of General Education information will improve Description Including pain rating scale, medication(s)/side effects and non-pharmacologic comfort measures Outcome: Progressing   

## 2022-07-22 NOTE — Progress Notes (Signed)
Melanie Moreno [id number] DOB: 1976-03-31 DOA: 07/20/2022 PCP: Sandi Mariscal, MD   Subj: CC: abd pain HPI: 46 year old WF PMHx Essential HTN, tobacco abuse, EtOH abuse,  presents to the ER today with onset of abdominal pain 2 days ago.  Patient states that she went out with her children to help celebrate her upcoming 46th birthday.  She drank excessively 2 days ago woke up with severe abdominal pain.  She has been nauseous and vomiting for the last 24 hours.  Came to the ER for evaluation.  Lipase minimally elevated to 104.  Mild pancreatic head edema.  Patient refused to be discharged.   Obj: 10/1 A/O x4. does not have PCP but has appointment 22 November for establish care care.  Does not have endocrinologist.  Began to have flares of pancreatitis 3 years ago.   Objective: VITAL SIGNS: Temp: 98.4 F (36.9 C) (10/01 0555) Temp Source: Oral (09/30 2105) BP: 187/99 (10/01 0825) Pulse Rate: 57 (10/01 0826) SPO2; FIO2:   Intake/Output Summary (Last 24 hours) at 07/22/2022 0830 Last data filed at 07/22/2022 0556 Gross per 24 hour  Intake 3382.39 ml  Output 100 ml  Net 3282.39 ml      Exam: Evaluated by [facility] physician 9/30 no charge .   Mobility Assessment (last 72 hours)     Mobility Assessment     Row Name 07/22/22 0729 07/21/22 2100 07/21/22 0802 07/21/22 0535     Does patient have an order for bedrest or is patient medically unstable No - Continue assessment No - Continue assessment No - Continue assessment No - Continue assessment    What is the highest level of mobility based on the progressive mobility assessment? Level 6 (Walks independently in room and hall) - Balance while walking in room without assist - Complete Level 6 (Walks independently in room and hall) - Balance while walking in room without assist - Complete Level 6 (Walks independently in room and hall) - Balance while walking in room without assist - Complete Level 6 (Walks independently in room and hall) -  Balance while walking in room without assist - Complete               DVT prophylaxis: SCD Code Status: Full Family Communication:  Status is: Inpatient    Dispo: The patient is from:               Anticipated d/c is to:               Anticipated d/c date is:               Patient currently     Procedures/Significant Events:    Consultants:     Cultures   Antimicrobials:   A/P  Acute EtOH Pancreatitis Observation medical bed.  Due to excessive alcohol use.  Continue with IV Protonix.  Would minimize opiate use.  Continue IV fluids.  N.p.o. except ice chips.  Repeat lipase this afternoon.  Hopefully discharge in 24 hours. Lab Results  Component Value Date   LIPASE 130 (H) 07/22/2022   LIPASE 130 (H) 07/21/2022   LIPASE 104 (H) 07/20/2022   LIPASE 27 06/05/2020   LIPASE 28 09/28/2018  -10/1 in Am.  Speak with LCSW she will need to get with patient and find out what PCP she has appointment with on November 22 to establish care.  That appointment will need to be moved up to within the next 2 weeks. - 10/1 in A.m.Speak with LCSW will need  to locate Endocrinologist for patient to establish care upon discharge -10/1 consult TOC resources for alcohol abuse   Tobacco abuse Patient smokes 1 pack a day.  Declines nicotine patch.  EtOH abuse - 9/30 EtOH level<10 - 9/30 urine tox screen positive for opiates.  Urine obtained after pain medication administered for pancreatitis   Essential HTN Continue Lopressor 50 mg twice daily. -10/1 Hydralazine IV SBP> 140 or DBP> 100  Hypokalemia - Potassium goal> 4 - 10/1 Potassium IV 60 mEq  Hypomagnesmia - Magnesium goal> 2 - 10/1 Magnesium IV 3 g  Refractory Nausea -10/1 Thorazine IV 25 mg x 1  Hypoglycemia -10/1 glucose chewable tablet 4 g x 1 - 10/1 D10 51ml/hr---> D5-0.9% saline 170ml/hr -10/1 sensitive SSI  Insomnia - Seroquel 50 mg qhs     Care during the described time interval was provided by me .   I have reviewed this patient's available data, including medical history, events of note, physical examination, and all test results as part of my evaluation.

## 2022-07-23 LAB — CBC WITH DIFFERENTIAL/PLATELET
Abs Immature Granulocytes: 0.01 10*3/uL (ref 0.00–0.07)
Basophils Absolute: 0 10*3/uL (ref 0.0–0.1)
Basophils Relative: 0 %
Eosinophils Absolute: 0.1 10*3/uL (ref 0.0–0.5)
Eosinophils Relative: 3 %
HCT: 33.6 % — ABNORMAL LOW (ref 36.0–46.0)
Hemoglobin: 11.2 g/dL — ABNORMAL LOW (ref 12.0–15.0)
Immature Granulocytes: 0 %
Lymphocytes Relative: 39 %
Lymphs Abs: 2.1 10*3/uL (ref 0.7–4.0)
MCH: 35.3 pg — ABNORMAL HIGH (ref 26.0–34.0)
MCHC: 33.3 g/dL (ref 30.0–36.0)
MCV: 106 fL — ABNORMAL HIGH (ref 80.0–100.0)
Monocytes Absolute: 0.6 10*3/uL (ref 0.1–1.0)
Monocytes Relative: 10 %
Neutro Abs: 2.6 10*3/uL (ref 1.7–7.7)
Neutrophils Relative %: 48 %
Platelets: 145 10*3/uL — ABNORMAL LOW (ref 150–400)
RBC: 3.17 MIL/uL — ABNORMAL LOW (ref 3.87–5.11)
RDW: 12 % (ref 11.5–15.5)
WBC: 5.5 10*3/uL (ref 4.0–10.5)
nRBC: 0 % (ref 0.0–0.2)

## 2022-07-23 LAB — COMPREHENSIVE METABOLIC PANEL
ALT: 33 U/L (ref 0–44)
AST: 30 U/L (ref 15–41)
Albumin: 3 g/dL — ABNORMAL LOW (ref 3.5–5.0)
Alkaline Phosphatase: 48 U/L (ref 38–126)
Anion gap: 3 — ABNORMAL LOW (ref 5–15)
BUN: 6 mg/dL (ref 6–20)
CO2: 23 mmol/L (ref 22–32)
Calcium: 8 mg/dL — ABNORMAL LOW (ref 8.9–10.3)
Chloride: 111 mmol/L (ref 98–111)
Creatinine, Ser: 0.47 mg/dL (ref 0.44–1.00)
GFR, Estimated: >60 mL/min (ref >60)
Glucose, Bld: 98 mg/dL (ref 70–99)
Potassium: 4.3 mmol/L (ref 3.5–5.1)
Sodium: 137 mmol/L (ref 135–145)
Total Bilirubin: 0.2 mg/dL — ABNORMAL LOW (ref 0.3–1.2)
Total Protein: 5.4 g/dL — ABNORMAL LOW (ref 6.5–8.1)

## 2022-07-23 LAB — GLUCOSE, CAPILLARY
Glucose-Capillary: 102 mg/dL — ABNORMAL HIGH (ref 70–99)
Glucose-Capillary: 116 mg/dL — ABNORMAL HIGH (ref 70–99)
Glucose-Capillary: 154 mg/dL — ABNORMAL HIGH (ref 70–99)
Glucose-Capillary: 88 mg/dL (ref 70–99)
Glucose-Capillary: 90 mg/dL (ref 70–99)
Glucose-Capillary: 92 mg/dL (ref 70–99)

## 2022-07-23 LAB — MAGNESIUM: Magnesium: 2.3 mg/dL (ref 1.7–2.4)

## 2022-07-23 LAB — PHOSPHORUS: Phosphorus: 3.3 mg/dL (ref 2.5–4.6)

## 2022-07-23 LAB — LIPASE, BLOOD: Lipase: 51 U/L (ref 11–51)

## 2022-07-23 MED ORDER — OXYCODONE HCL 5 MG PO TABS
5.0000 mg | ORAL_TABLET | ORAL | Status: DC | PRN
  Administered 2022-07-23 – 2022-07-24 (×3): 5 mg via ORAL
  Filled 2022-07-23 (×3): qty 1

## 2022-07-23 NOTE — Progress Notes (Signed)
Melanie Moreno HDQ:[id number] DOB: 1975/12/15 DOA: 07/20/2022 PCP: Sandi Mariscal, MD   Subj: CC: abd pain HPI: 46 year old WF PMHx Essential HTN, tobacco abuse, EtOH abuse,  presents to the ER today with onset of abdominal pain 2 days ago.  Patient states that she went out with her children to help celebrate her upcoming 46th birthday.  She drank excessively 2 days ago woke up with severe abdominal pain.  She has been nauseous and vomiting for the last 24 hours.  Came to the ER for evaluation.  Lipase minimally elevated to 104.  Mild pancreatic head edema.  Patient refused to be discharged.   Obj: 10/2 A/O x4 would like to discontinue IV Dilaudid, and try p.o. pain medication.  States still positive abdominal pain but decreased.  Would like to try clear liquid diet in the A.m.   Objective: VITAL SIGNS: Temp: 98 F (36.7 C) (10/02 0852) Temp Source: Oral (10/02 0852) BP: 139/90 (10/02 0852) Pulse Rate: 71 (10/02 0852) SPO2; FIO2:   Intake/Output Summary (Last 24 hours) at 07/23/2022 0949 Last data filed at 07/23/2022 0918 Gross per 24 hour  Intake 3232.37 ml  Output 1 ml  Net 3231.37 ml      Exam: Evaluated by [facility] physician 9/30 no charge .   Mobility Assessment (last 72 hours)     Mobility Assessment     Row Name 07/23/22 [phone] 07/22/22 0847 07/22/22 0729 07/21/22 2100 07/21/22 0802   Does patient have an order for bedrest or is patient medically unstable No - Continue assessment No - Continue assessment No - Continue assessment No - Continue assessment No - Continue assessment   What is the highest level of mobility based on the progressive mobility assessment? Level 6 (Walks independently in room and hall) - Balance while walking in room without assist - Complete Level 6 (Walks independently in room and hall) - Balance while walking in room without assist - Complete Level 6 (Walks independently in room and hall) - Balance while walking in room without assist - Complete Level 6  (Walks independently in room and hall) - Balance while walking in room without assist - Complete Level 6 (Walks independently in room and hall) - Balance while walking in room without assist - Complete    Row Name 07/21/22 0535           Does patient have an order for bedrest or is patient medically unstable No - Continue assessment       What is the highest level of mobility based on the progressive mobility assessment? Level 6 (Walks independently in room and hall) - Balance while walking in room without assist - Complete                  DVT prophylaxis: SCD Code Status: Full Family Communication:  Status is: Inpatient    Dispo: The patient is from:               Anticipated d/c is to:               Anticipated d/c date is:               Patient currently     Procedures/Significant Events:    Consultants:     Cultures   Antimicrobials:   A/P  Acute EtOH Pancreatitis -Due to excessive alcohol use.   -Continue with IV Protonix.  Would minimize opiate use.  Continue IV fluids.  N.p.o. except ice chips.  Repeat lipase this afternoon.  Lab Results  Component Value Date   LIPASE 51 07/23/2022   LIPASE 130 (H) 07/22/2022   LIPASE 130 (H) 07/21/2022   LIPASE 104 (H) 07/20/2022   LIPASE 27 06/05/2020  -9/30 Fentanyl 12 mcg/h patch -10/1 in Am.  Speak with LCSW she will need to get with patient and find out what PCP she has appointment with on November 22 to establish care.  That appointment will need to be moved up to within the next 2 weeks. - 10/1 in A.m.Speak with LCSW will need to locate Endocrinologist for patient to establish care upon discharge -10/1 consult TOC resources for alcohol abuse -10/2 DC IV Dilaudid - 10/2 Oxy IR PRN -10/3 begin clear liquid diet   Tobacco abuse Patient smokes 1 pack a day.  Declines nicotine patch.  EtOH abuse - 9/30 EtOH level<10 - 9/30 urine tox screen positive for opiates.  Urine obtained after pain medication  administered for pancreatitis   Essential HTN Continue Lopressor 50 mg twice daily. -10/1 Hydralazine IV SBP> 140 or DBP> 100  Hypokalemia - Potassium goal> 4 - 10/1 Potassium IV 60 mEq  Hypomagnesmia - Magnesium goal> 2 - 10/1 Magnesium IV 3 g  Refractory Nausea -10/1 Thorazine IV 25 mg x 1  Hypoglycemia -10/1 glucose chewable tablet 4 g x 1 - 10/1 D10 24ml/hr---> D5-0.9% saline 173ml/hr -10/1 sensitive SSI  Insomnia - Seroquel 50 mg qhs     Care during the described time interval was provided by me .  I have reviewed this patient's available data, including medical history, events of note, physical examination, and all test results as part of my evaluation.

## 2022-07-23 NOTE — Plan of Care (Signed)

## 2022-07-24 DIAGNOSIS — G43019 Migraine without aura, intractable, without status migrainosus: Secondary | ICD-10-CM

## 2022-07-24 DIAGNOSIS — G43009 Migraine without aura, not intractable, without status migrainosus: Secondary | ICD-10-CM | POA: Diagnosis not present

## 2022-07-24 LAB — CBC WITH DIFFERENTIAL/PLATELET
Abs Immature Granulocytes: 0.02 10*3/uL (ref 0.00–0.07)
Basophils Absolute: 0 10*3/uL (ref 0.0–0.1)
Basophils Relative: 1 %
Eosinophils Absolute: 0.2 10*3/uL (ref 0.0–0.5)
Eosinophils Relative: 3 %
HCT: 36.7 % (ref 36.0–46.0)
Hemoglobin: 12.2 g/dL (ref 12.0–15.0)
Immature Granulocytes: 0 %
Lymphocytes Relative: 34 %
Lymphs Abs: 2.2 10*3/uL (ref 0.7–4.0)
MCH: 35.4 pg — ABNORMAL HIGH (ref 26.0–34.0)
MCHC: 33.2 g/dL (ref 30.0–36.0)
MCV: 106.4 fL — ABNORMAL HIGH (ref 80.0–100.0)
Monocytes Absolute: 0.6 10*3/uL (ref 0.1–1.0)
Monocytes Relative: 10 %
Neutro Abs: 3.3 10*3/uL (ref 1.7–7.7)
Neutrophils Relative %: 52 %
Platelets: 159 10*3/uL (ref 150–400)
RBC: 3.45 MIL/uL — ABNORMAL LOW (ref 3.87–5.11)
RDW: 12.2 % (ref 11.5–15.5)
WBC: 6.3 10*3/uL (ref 4.0–10.5)
nRBC: 0 % (ref 0.0–0.2)

## 2022-07-24 LAB — PHOSPHORUS: Phosphorus: 3.5 mg/dL (ref 2.5–4.6)

## 2022-07-24 LAB — COMPREHENSIVE METABOLIC PANEL
ALT: 55 U/L — ABNORMAL HIGH (ref 0–44)
AST: 65 U/L — ABNORMAL HIGH (ref 15–41)
Albumin: 3.5 g/dL (ref 3.5–5.0)
Alkaline Phosphatase: 73 U/L (ref 38–126)
Anion gap: 6 (ref 5–15)
BUN: 6 mg/dL (ref 6–20)
CO2: 25 mmol/L (ref 22–32)
Calcium: 8.8 mg/dL — ABNORMAL LOW (ref 8.9–10.3)
Chloride: 108 mmol/L (ref 98–111)
Creatinine, Ser: 0.55 mg/dL (ref 0.44–1.00)
GFR, Estimated: >60 mL/min (ref >60)
Glucose, Bld: 94 mg/dL (ref 70–99)
Potassium: 3.8 mmol/L (ref 3.5–5.1)
Sodium: 139 mmol/L (ref 135–145)
Total Bilirubin: 0.4 mg/dL (ref 0.3–1.2)
Total Protein: 6.5 g/dL (ref 6.5–8.1)

## 2022-07-24 LAB — LIPASE, BLOOD: Lipase: 38 U/L (ref 11–51)

## 2022-07-24 LAB — MAGNESIUM: Magnesium: 2.1 mg/dL (ref 1.7–2.4)

## 2022-07-24 LAB — GLUCOSE, CAPILLARY
Glucose-Capillary: 81 mg/dL (ref 70–99)
Glucose-Capillary: 108 mg/dL — ABNORMAL HIGH (ref 70–99)
Glucose-Capillary: 117 mg/dL — ABNORMAL HIGH (ref 70–99)
Glucose-Capillary: 140 mg/dL — ABNORMAL HIGH (ref 70–99)
Glucose-Capillary: 133 mg/dL — ABNORMAL HIGH (ref 70–99)

## 2022-07-24 MED ORDER — SUMATRIPTAN SUCCINATE 6 MG/0.5ML ~~LOC~~ SOLN
6.0000 mg | Freq: Once | SUBCUTANEOUS | Status: DC
  Filled 2022-07-24: qty 0.5

## 2022-07-24 MED ORDER — LISINOPRIL 5 MG PO TABS
2.5000 mg | ORAL_TABLET | Freq: Every day | ORAL | Status: DC
  Filled 2022-07-24: qty 1

## 2022-07-24 NOTE — Progress Notes (Signed)
Melanie Moreno ZWC:[id number] DOB: 1976/04/04 DOA: 07/20/2022 PCP: Sandi Mariscal, MD   Subj: CC: abd pain HPI: 46 year old WF PMHx Essential HTN, tobacco abuse, EtOH abuse,  presents to the ER today with onset of abdominal pain 2 days ago.  Patient states that she went out with her children to help celebrate her upcoming 46th birthday.  She drank excessively 2 days ago woke up with severe abdominal pain.  She has been nauseous and vomiting for the last 24 hours.  Came to the ER for evaluation.  Lipase minimally elevated to 104.  Mild pancreatic head edema.  Patient refused to be discharged.   Obj:  10/3 A/O x4, negative postprandial nausea, negative vomiting, negative abdominal pain.  Ready to try and advance her diet.,   Objective: VITAL SIGNS: Temp: 97.9 F (36.6 C) (10/03 1018) Temp Source: Oral (10/03 1018) BP: 134/89 (10/03 1516) Pulse Rate: 89 (10/03 1516) SPO2; FIO2:   Intake/Output Summary (Last 24 hours) at 07/24/2022 1540 Last data filed at 07/24/2022 0600 Gross per 24 hour  Intake 2584.37 ml  Output --  Net 2584.37 ml   Physical Exam:  General: A/O x4, No acute respiratory distress Eyes: negative scleral hemorrhage, negative anisocoria, negative icterus ENT: Negative Runny nose, negative gingival bleeding, Neck:  Negative scars, masses, torticollis, lymphadenopathy, JVD Lungs: Clear to auscultation bilaterally without wheezes or crackles Cardiovascular: Regular rate and rhythm without murmur gallop or rub normal S1 and S2 Abdomen: negative abdominal pain, nondistended, positive soft, bowel sounds, no rebound, no ascites, no appreciable mass Extremities: No significant cyanosis, clubbing, or edema bilateral lower extremities Skin: Negative rashes, lesions, ulcers Psychiatric:  Negative depression, negative anxiety, negative fatigue, negative mania  Central nervous system:  Cranial nerves II through XII intact, tongue/uvula midline, all extremities muscle strength 5/5,  sensation intact throughout, negative dysarthria, negative expressive aphasia, negative receptive aphasia.    Mobility Assessment (last 72 hours)     Mobility Assessment     Row Name 07/24/22 [phone] 07/23/22 0852 07/22/22 0847 07/22/22 0729 07/21/22 2100   Does patient have an order for bedrest or is patient medically unstable No - Continue assessment No - Continue assessment No - Continue assessment No - Continue assessment No - Continue assessment   What is the highest level of mobility based on the progressive mobility assessment? Level 6 (Walks independently in room and hall) - Balance while walking in room without assist - Complete Level 6 (Walks independently in room and hall) - Balance while walking in room without assist - Complete Level 6 (Walks independently in room and hall) - Balance while walking in room without assist - Complete Level 6 (Walks independently in room and hall) - Balance while walking in room without assist - Complete Level 6 (Walks independently in room and hall) - Balance while walking in room without assist - Complete              DVT prophylaxis: SCD Code Status: Full Family Communication:  Status is: Inpatient    Dispo: The patient is from: Home              Anticipated d/c is to: Home              Anticipated d/c date is: 1 day              Patient currently unstable medically    Procedures/Significant Events:    Consultants:     Cultures   Antimicrobials:   A/P  Acute EtOH Pancreatitis -Due  to excessive alcohol use.   -Continue with IV Protonix.  Would minimize opiate use.  Continue IV fluids.  N.p.o. except ice chips.  Repeat lipase this afternoon.  Lab Results  Component Value Date   LIPASE 38 07/24/2022   LIPASE 51 07/23/2022   LIPASE 130 (H) 07/22/2022   LIPASE 130 (H) 07/21/2022   LIPASE 104 (H) 07/20/2022  -9/30 Fentanyl 12 mcg/h patch -10/1 in Am.  Speak with LCSW she will need to get with patient and find out what  PCP she has appointment with on November 22 to establish care.  That appointment will need to be moved up to within the next 2 weeks. - 10/1 in A.m.Speak with LCSW will need to locate Endocrinologist for patient to establish care upon discharge -10/1 consult TOC resources for alcohol abuse -10/2 DC IV Dilaudid - 10/2 Oxy IR PRN -10/3 begin clear liquid diet.  ADDENDUM patient tolerated Am clear liquid diet---> bland diet - 10/3 if patient tolerates bland diet overnight discharge in A.m.   Tobacco abuse Patient smokes 1 pack a day.  Declines nicotine patch.  EtOH abuse - 9/30 EtOH level<10 - 9/30 urine tox screen positive for opiates.  Urine obtained after pain medication administered for pancreatitis   Essential HTN -Lopressor 50 mg BID  -10/1 Hydralazine IV SBP> 140 or DBP> 100  Hypokalemia - Potassium goal> 4 - 10/1 Potassium IV 60 mEq  Hypomagnesmia - Magnesium goal> 2 - 10/1 Magnesium IV 3 g  Refractory Nausea -10/1 Thorazine IV 25 mg x 1  Hypoglycemia -10/1 glucose chewable tablet 4 g x 1 - 10/1 D10 71ml/hr---> D5-0.9% saline 148ml/hr -10/1 sensitive SSI -10/3 DC D5-0.9% saline  Insomnia - Seroquel 50 mg qhs  Migraine - 10/3 resolved with hot compress     Care during the described time interval was provided by me .  I have reviewed this patient's available data, including medical history, events of note, physical examination, and all test results as part of my evaluation.

## 2022-07-24 NOTE — Plan of Care (Signed)
  Problem: Activity: Goal: Risk for activity intolerance will decrease Outcome: Progressing   Problem: Safety: Goal: Ability to remain free from injury will improve Outcome: Progressing   

## 2022-07-25 ENCOUNTER — Other Ambulatory Visit (HOSPITAL_COMMUNITY): Payer: Self-pay

## 2022-07-25 ENCOUNTER — Other Ambulatory Visit: Payer: Self-pay

## 2022-07-25 LAB — COMPREHENSIVE METABOLIC PANEL
ALT: 41 U/L (ref 0–44)
AST: 32 U/L (ref 15–41)
Albumin: 3 g/dL — ABNORMAL LOW (ref 3.5–5.0)
Alkaline Phosphatase: 60 U/L (ref 38–126)
Anion gap: 4 — ABNORMAL LOW (ref 5–15)
BUN: 8 mg/dL (ref 6–20)
CO2: 24 mmol/L (ref 22–32)
Calcium: 9.1 mg/dL (ref 8.9–10.3)
Chloride: 111 mmol/L (ref 98–111)
Creatinine, Ser: 0.68 mg/dL (ref 0.44–1.00)
GFR, Estimated: >60 mL/min (ref >60)
Glucose, Bld: 116 mg/dL — ABNORMAL HIGH (ref 70–99)
Potassium: 4.1 mmol/L (ref 3.5–5.1)
Sodium: 139 mmol/L (ref 135–145)
Total Bilirubin: 0.5 mg/dL (ref 0.3–1.2)
Total Protein: 5.7 g/dL — ABNORMAL LOW (ref 6.5–8.1)

## 2022-07-25 LAB — CBC WITH DIFFERENTIAL/PLATELET
Abs Immature Granulocytes: 0.01 10*3/uL (ref 0.00–0.07)
Basophils Absolute: 0 10*3/uL (ref 0.0–0.1)
Basophils Relative: 0 %
Eosinophils Absolute: 0.2 10*3/uL (ref 0.0–0.5)
Eosinophils Relative: 3 %
HCT: 33.8 % — ABNORMAL LOW (ref 36.0–46.0)
Hemoglobin: 11.5 g/dL — ABNORMAL LOW (ref 12.0–15.0)
Immature Granulocytes: 0 %
Lymphocytes Relative: 33 %
Lymphs Abs: 2 10*3/uL (ref 0.7–4.0)
MCH: 35.4 pg — ABNORMAL HIGH (ref 26.0–34.0)
MCHC: 34 g/dL (ref 30.0–36.0)
MCV: 104 fL — ABNORMAL HIGH (ref 80.0–100.0)
Monocytes Absolute: 0.8 10*3/uL (ref 0.1–1.0)
Monocytes Relative: 12 %
Neutro Abs: 3.2 10*3/uL (ref 1.7–7.7)
Neutrophils Relative %: 52 %
Platelets: 168 10*3/uL (ref 150–400)
RBC: 3.25 MIL/uL — ABNORMAL LOW (ref 3.87–5.11)
RDW: 12 % (ref 11.5–15.5)
WBC: 6.1 10*3/uL (ref 4.0–10.5)
nRBC: 0 % (ref 0.0–0.2)

## 2022-07-25 LAB — GLUCOSE, CAPILLARY
Glucose-Capillary: 104 mg/dL — ABNORMAL HIGH (ref 70–99)
Glucose-Capillary: 102 mg/dL — ABNORMAL HIGH (ref 70–99)
Glucose-Capillary: 105 mg/dL — ABNORMAL HIGH (ref 70–99)

## 2022-07-25 LAB — MAGNESIUM: Magnesium: 1.9 mg/dL (ref 1.7–2.4)

## 2022-07-25 LAB — PHOSPHORUS: Phosphorus: 3.5 mg/dL (ref 2.5–4.6)

## 2022-07-25 LAB — LIPASE, BLOOD: Lipase: 32 U/L (ref 11–51)

## 2022-07-25 MED ORDER — METOPROLOL TARTRATE 50 MG PO TABS
50.0000 mg | ORAL_TABLET | Freq: Two times a day (BID) | ORAL | 2 refills | Status: DC
  Filled 2022-07-25 (×2): qty 60, 30d supply, fill #0

## 2022-07-25 MED ORDER — AMLODIPINE BESYLATE 5 MG PO TABS
5.0000 mg | ORAL_TABLET | Freq: Every day | ORAL | 11 refills | Status: DC
  Filled 2022-07-25: qty 30, 30d supply, fill #0

## 2022-07-25 MED ORDER — MULTI-VITAMIN/MINERALS PO TABS
1.0000 | ORAL_TABLET | Freq: Every day | ORAL | 2 refills | Status: DC
  Filled 2022-07-25: qty 30, 30d supply, fill #0

## 2022-07-25 MED ORDER — NICOTINE POLACRILEX 2 MG MT GUM
2.0000 mg | CHEWING_GUM | OROMUCOSAL | 0 refills | Status: DC | PRN
  Filled 2022-07-25: qty 100, fill #0

## 2022-07-25 MED ORDER — MULTI-VITAMIN/MINERALS PO TABS
1.0000 | ORAL_TABLET | Freq: Every day | ORAL | 2 refills | Status: AC
  Filled 2022-07-25: qty 30, 30d supply, fill #0

## 2022-07-25 MED ORDER — QUETIAPINE FUMARATE 50 MG PO TABS
50.0000 mg | ORAL_TABLET | Freq: Every day | ORAL | 2 refills | Status: DC
  Filled 2022-07-25 (×2): qty 30, 30d supply, fill #0

## 2022-07-25 MED ORDER — CALCIUM CARBONATE ANTACID 750 MG PO CHEW
750.0000 mg | CHEWABLE_TABLET | Freq: Every day | ORAL | 1 refills | Status: DC | PRN
  Filled 2022-07-25: qty 30, 30d supply, fill #0

## 2022-07-25 MED ORDER — PANTOPRAZOLE SODIUM 40 MG PO TBEC
40.0000 mg | DELAYED_RELEASE_TABLET | Freq: Two times a day (BID) | ORAL | 2 refills | Status: DC
  Filled 2022-07-25: qty 60, 30d supply, fill #0

## 2022-07-25 MED ORDER — NICOTINE POLACRILEX 2 MG MT GUM
2.0000 mg | CHEWING_GUM | OROMUCOSAL | 0 refills | Status: DC | PRN
  Filled 2022-07-25: qty 50, 20d supply, fill #0

## 2022-07-25 MED ORDER — PANTOPRAZOLE SODIUM 40 MG PO TBEC
40.0000 mg | DELAYED_RELEASE_TABLET | Freq: Two times a day (BID) | ORAL | 2 refills | Status: DC
  Filled 2022-07-25 (×2): qty 60, 30d supply, fill #0

## 2022-07-25 MED ORDER — PANTOPRAZOLE SODIUM 40 MG PO TBEC
40.0000 mg | DELAYED_RELEASE_TABLET | Freq: Two times a day (BID) | ORAL | Status: DC
  Administered 2022-07-25: 40 mg via ORAL
  Filled 2022-07-25: qty 1

## 2022-07-25 MED ORDER — QUETIAPINE FUMARATE 50 MG PO TABS
50.0000 mg | ORAL_TABLET | Freq: Every day | ORAL | 2 refills | Status: DC
  Filled 2022-07-25: qty 30, 30d supply, fill #0

## 2022-07-25 MED ORDER — METOPROLOL TARTRATE 50 MG PO TABS
50.0000 mg | ORAL_TABLET | Freq: Two times a day (BID) | ORAL | 2 refills | Status: DC
  Filled 2022-07-25: qty 60, 30d supply, fill #0

## 2022-07-25 MED ORDER — AMLODIPINE BESYLATE 5 MG PO TABS
5.0000 mg | ORAL_TABLET | Freq: Every day | ORAL | 11 refills | Status: DC
  Filled 2022-07-25 (×2): qty 30, 30d supply, fill #0

## 2022-07-25 MED ORDER — AMLODIPINE BESYLATE 5 MG PO TABS
5.0000 mg | ORAL_TABLET | Freq: Once | ORAL | Status: AC
  Administered 2022-07-25: 5 mg via ORAL
  Filled 2022-07-25: qty 1

## 2022-07-25 NOTE — TOC Transition Note (Signed)
Transition of Care [facility]) - CM/SW Discharge Note   Patient Details  Name: Melanie Moreno MRN: [id number] Date of Birth: 01/23/1976  Transition of Care [facility]) CM/SW Contact:  Lennart Pall, LCSW Phone Number: 07/25/2022, 3:37 PM   Clinical Narrative:    Pt medically cleared for dc today.  She has a new patient appt with [place] [facility] on 10/31 with information on AVS.  Pt aware she will need to fill discharge prescriptions at the [facility] at [facility].  SA resources placed on AVS as well.   Final next level of care: Home/Self Care Barriers to Discharge: Barriers Resolved   Patient Goals and CMS Choice Patient states their goals for this hospitalization and ongoing recovery are:: home      Discharge Placement                       Discharge Plan and Services   Discharge Planning Services: CM Consult            DME Arranged: N/A DME Agency: NA                  Social Determinants of Health (SDOH) Interventions     Readmission Risk Interventions     No data to display

## 2022-07-25 NOTE — Progress Notes (Signed)
Provided discharge education/instruction, all questions and concerns, Pt not in acute distress, meds delivered to room. Pt to discharge home with belongings.

## 2022-07-25 NOTE — Plan of Care (Signed)

## 2022-07-25 NOTE — Discharge Summary (Signed)
Physician Discharge Summary   Patient: Melanie Moreno MRN: [id number] DOB: Nov 09, 1975  Admit date:     07/20/2022  Discharge date: 07/25/22  Discharge Physician: Kathlen Mody   PCP: Melanie Real, MD   Recommendations at discharge:  Please follow up with PCp in one week.   Discharge Diagnoses: Principal Problem:   Acute alcoholic pancreatitis Active Problems:   Essential hypertension   Tobacco use disorder   Migraine headache without aura    Hospital Course: Melanie Moreno is a 46 y.o. F with HTN, alcohol related pancreatitis and gastritis who presented with 1 day abdominal pain and vomiting.    In the ER, lipase 104, AST/ALT 60/61.  CT shows acute pancreatitis, gastritis and duodenitis.  Started on fluids, NPO.  Admitted to med surg.  Assessment and Plan:   Acute EtOH Pancreatitis Appears to have resolved.  Discharge on oral PPI.  She has PCP appt scheduled.     Tobacco abuse Patient smokes 1 pack a day.  Declines nicotine patch.   EtOH abuse - 9/30 EtOH level<10 - 9/30 urine tox screen positive for opiates.  Urine obtained after pain medication administered for pancreatitis   Essential HTN She was discharged on Norvasc 5 mg and Lopressor 50 mg twice daily   Hypokalemia Replaced.    Hypomagnesmia Resolved.    Refractory Nausea Resolved.    Hypoglycemia Resolved.    Insomnia - Seroquel 50 mg qhs   Migraine Resolved.           Consultants: None Procedures performed: None Disposition: Home Diet recommendation:  Discharge Diet Orders (From admission, onward)     Start     Ordered   07/25/22 0000  Diet - low sodium heart healthy        07/25/22 1130           Regular diet DISCHARGE MEDICATION: Allergies as of 07/25/2022       Reactions   Dilaudid [hydromorphone Hcl] Other (See Comments)   headaches        Medication List     STOP taking these medications    meloxicam 7.5 MG tablet Commonly known as: MOBIC   naproxen sodium 220  MG tablet Commonly known as: ALEVE   omeprazole 20 MG tablet Commonly known as: PRILOSEC OTC       TAKE these medications    amLODipine 5 MG tablet Commonly known as: NORVASC Take 1 tablet (5 mg total) by mouth daily.   calcium carbonate 750 MG chewable tablet Commonly known as: TUMS EX Chew 1 tablet (750 mg total) by mouth daily as needed for heartburn. Notes to patient: Resume home regimen   metoprolol tartrate 50 MG tablet Commonly known as: LOPRESSOR Take 1 tablet (50 mg total) by mouth 2 (two) times daily.   multivitamin with minerals tablet Take 1 tablet by mouth daily. Notes to patient: Resume home regimen   nicotine polacrilex 2 MG gum Commonly known as: NICORETTE Chew 1 each (2 mg total) by mouth as needed for smoking cessation.   pantoprazole 40 MG tablet Commonly known as: PROTONIX Take 1 tablet (40 mg total) by mouth 2 (two) times daily.   QUEtiapine 50 MG tablet Commonly known as: SEROQUEL Take 1 tablet (50 mg total) by mouth at bedtime.        Discharge Exam: Filed Weights   07/22/22 0500 07/24/22 0500 07/25/22 0359  Weight: 63.4 kg 63.9 kg 63.3 kg   General exam: Appears calm and comfortable  Respiratory system: Clear to auscultation.  Respiratory effort normal. Cardiovascular system: S1 & S2 heard, RRR. No JVD, murmurs, rubs, gallops or clicks. No pedal edema. Gastrointestinal system: Abdomen is nondistended, soft and nontender. No organomegaly or masses felt. Normal bowel sounds heard. Central nervous system: Alert and oriented. No focal neurological deficits. Extremities: Symmetric 5 x 5 power. Skin: No rashes, lesions or ulcers Psychiatry: Judgement and insight appear normal. Mood & affect appropriate.     Condition at discharge: fair  The results of significant diagnostics from this hospitalization (including imaging, microbiology, ancillary and laboratory) are listed below for reference.   Imaging Studies: CT ABDOMEN PELVIS W  CONTRAST  Result Date: 07/21/2022 CLINICAL DATA:  Left upper quadrant pain radiating to the back; feels like past episodes of pancreatitis. EXAM: CT ABDOMEN AND PELVIS WITH CONTRAST TECHNIQUE: Multidetector CT imaging of the abdomen and pelvis was performed using the standard protocol following bolus administration of intravenous contrast. RADIATION DOSE REDUCTION: This exam was performed according to the departmental dose-optimization program which includes automated exposure control, adjustment of the mA and/or kV according to patient size and/or use of iterative reconstruction technique. CONTRAST:  63mL OMNIPAQUE IOHEXOL 300 MG/ML  SOLN COMPARISON:  CT abdomen and pelvis 10/18/2021 FINDINGS: Lower chest: No acute abnormality. Hepatobiliary: Hepatic steatosis. No suspicious hepatic lesion. Hyperenhancement of the common bile duct is likely reactive due to pancreatitis. No biliary ductal dilation. Pancreas: Hazy fluid about the head of the pancreas compatible with acute pancreatitis. The pancreatic parenchyma is normally enhancing. No pseudocyst formation. No pancreatic duct dilation. Spleen: Unremarkable. Adrenals/Urinary Tract: Unremarkable adrenal glands. Prompt symmetric nephrograms. No urinary calculi or hydronephrosis. Stomach/Bowel: Marked wall thickening of the gastric antrum. Additional wall thickening of the 1st-3rd portions of the duodenum. Normal caliber large and small bowel. The appendix is not visualized. Vascular/Lymphatic: Predominantly non calcified aortic atherosclerosis. No suspicious adenopathy. Reproductive: Uterus and bilateral adnexa are unremarkable. Other: No free intraperitoneal air. Musculoskeletal: No acute or significant osseous findings. IMPRESSION: Acute pancreatitis of the pancreatic head. No evidence of pancreatic necrosis or pseudocyst formation. Gastritis and duodenitis may be due to peptic ulcer disease or reactive from adjacent pancreatitis. Aortic Atherosclerosis  (ICD10-I70.0). Electronically Signed   By: Minerva Fester M.D.   On: 07/21/2022 03:03    Microbiology: Results for orders placed or performed during the hospital encounter of 08/29/20  Respiratory Panel by RT PCR (Flu A&B, Covid) - Nasopharyngeal Swab     Status: None   Collection Time: 08/29/20  9:18 PM   Specimen: Nasopharyngeal Swab  Result Value Ref Range Status   SARS Coronavirus 2 by RT PCR NEGATIVE NEGATIVE Final    Comment: (NOTE) SARS-CoV-2 target nucleic acids are NOT DETECTED.  The SARS-CoV-2 RNA is generally detectable in upper respiratoy specimens during the acute phase of infection. The lowest concentration of SARS-CoV-2 viral copies this assay can detect is 131 copies/mL. A negative result does not preclude SARS-Cov-2 infection and should not be used as the sole basis for treatment or other patient management decisions. A negative result may occur with  improper specimen collection/handling, submission of specimen other than nasopharyngeal swab, presence of viral mutation(s) within the areas targeted by this assay, and inadequate number of viral copies (<131 copies/mL). A negative result must be combined with clinical observations, patient history, and epidemiological information. The expected result is Negative.  Fact Sheet for Patients:  https://www.moore.com/  Fact Sheet for Healthcare Providers:  https://www.young.biz/  This test is no t yet approved or cleared by the [place] FDA and  has  been authorized for detection and/or diagnosis of SARS-CoV-2 by FDA under an Emergency Use Authorization (EUA). This EUA will remain  in effect (meaning this test can be used) for the duration of the COVID-19 declaration under Section 564(b)(1) of the Act, 21 U.S.C. section 360bbb-3(b)(1), unless the authorization is terminated or revoked sooner.     Influenza A by PCR NEGATIVE NEGATIVE Final   Influenza B by PCR NEGATIVE  NEGATIVE Final    Comment: (NOTE) The Xpert Xpress SARS-CoV-2/FLU/RSV assay is intended as an aid in  the diagnosis of influenza from Nasopharyngeal swab specimens and  should not be used as a sole basis for treatment. Nasal washings and  aspirates are unacceptable for Xpert Xpress SARS-CoV-2/FLU/RSV  testing.  Fact Sheet for Patients: PinkCheek.be  Fact Sheet for Healthcare Providers: GravelBags.it  This test is not yet approved or cleared by the [place] FDA and  has been authorized for detection and/or diagnosis of SARS-CoV-2 by  FDA under an Emergency Use Authorization (EUA). This EUA will remain  in effect (meaning this test can be used) for the duration of the  Covid-19 declaration under Section 564(b)(1) of the Act, 21  U.S.C. section 360bbb-3(b)(1), unless the authorization is  terminated or revoked. Performed at [facility], [address] [place], [place] [address]     Labs: CBC: Recent Labs  Lab 07/21/22 [id number] 07/22/22 0418 07/23/22 0346 07/24/22 0337 07/25/22 0349  WBC 8.0 6.8 5.5 6.3 6.1  NEUTROABS 3.8 3.5 2.6 3.3 3.2  HGB 12.7 11.9* 11.2* 12.2 11.5*  HCT 38.4 36.3 33.6* 36.7 33.8*  MCV 106.1* 107.1* 106.0* 106.4* 104.0*  PLT 175 151 145* 159 397   Basic Metabolic Panel: Recent Labs  Lab 07/21/22 0835 07/22/22 0418 07/23/22 0346 07/24/22 0337 07/25/22 0349  NA 136 137 137 139 139  K 3.9 3.3* 4.3 3.8 4.1  CL 105 107 111 108 111  CO2 25 22 23 25 24   GLUCOSE 82 74 98 94 116*  BUN 11 8 6 6 8   CREATININE 0.53 0.54 0.47 0.55 0.68  CALCIUM 8.9 8.4* 8.0* 8.8* 9.1  MG 1.6* 1.6* 2.3 2.1 1.9  PHOS 2.9 4.1 3.3 3.5 3.5   Liver Function Tests: Recent Labs  Lab 07/21/22 0835 07/22/22 0418 07/23/22 0346 07/24/22 0337 07/25/22 0349  AST 38 41 30 65* 32  ALT 45* 44 33 55* 41  ALKPHOS 48 47 48 73 60  BILITOT 0.8 0.7 0.2* 0.4 0.5  PROT 6.2* 5.9* 5.4* 6.5 5.7*  ALBUMIN 3.4* 3.4*  3.0* 3.5 3.0*   CBG: Recent Labs  Lab 07/24/22 1611 07/24/22 2242 07/25/22 0357 07/25/22 0820 07/25/22 1139  GLUCAP 140* 133* 104* 102* 105*    Discharge time spent: 42 minutes  Signed: Hosie Poisson, MD [facility] 07/25/2022

## 2022-08-01 ENCOUNTER — Other Ambulatory Visit: Payer: Self-pay

## 2022-08-21 ENCOUNTER — Inpatient Hospital Stay: Payer: Medicaid Other | Admitting: Internal Medicine

## 2022-09-10 ENCOUNTER — Ambulatory Visit: Payer: Medicaid Other | Admitting: Family Medicine

## 2022-12-20 ENCOUNTER — Other Ambulatory Visit: Payer: Self-pay

## 2022-12-20 ENCOUNTER — Emergency Department (HOSPITAL_BASED_OUTPATIENT_CLINIC_OR_DEPARTMENT_OTHER)
Admission: EM | Admit: 2022-12-20 | Discharge: 2022-12-20 | Disposition: A | Discharge status: Home / Self Care | Payer: Medicaid Other | Attending: Emergency Medicine | Admitting: Emergency Medicine

## 2022-12-20 DIAGNOSIS — M5412 Radiculopathy, cervical region: Secondary | ICD-10-CM | POA: Insufficient documentation

## 2022-12-20 DIAGNOSIS — I10 Essential (primary) hypertension: Secondary | ICD-10-CM | POA: Diagnosis not present

## 2022-12-20 DIAGNOSIS — Z79899 Other long term (current) drug therapy: Secondary | ICD-10-CM | POA: Diagnosis not present

## 2022-12-20 DIAGNOSIS — F17210 Nicotine dependence, cigarettes, uncomplicated: Secondary | ICD-10-CM | POA: Insufficient documentation

## 2022-12-20 DIAGNOSIS — M542 Cervicalgia: Secondary | ICD-10-CM | POA: Diagnosis present

## 2022-12-20 MED ORDER — CARISOPRODOL 250 MG PO TABS: 250.0000 mg | ORAL_TABLET | Freq: Four times a day (QID) | ORAL | 0 refills | Status: DC | PRN

## 2022-12-20 MED ORDER — METOPROLOL TARTRATE 50 MG PO TABS: 50.0000 mg | ORAL_TABLET | Freq: Two times a day (BID) | ORAL | 2 refills | Status: DC

## 2022-12-20 NOTE — ED Notes (Addendum)
Patient verbalizes understanding of discharge instructions. Opportunity for questioning and answers were provided. Armband removed by staff, pt discharged from ED. Ambulated out to lobby  

## 2022-12-20 NOTE — ED Triage Notes (Signed)
Pt in with L neck pain that radiates into L shoulder. Pt states this has been ongoing x 4 days, and has been unable to sleep due to pain. Thinks she may have lifted something too heavy and injured herself. States the pain radiates down whole L arm

## 2022-12-20 NOTE — ED Provider Notes (Signed)
[facility] Provider Note: Georgena Spurling, MD, FACEP  CSN: [id number] MRN: [id number] ARRIVAL: 12/20/22 at [place]: [id number]   CHIEF COMPLAINT  Neck Pain   HISTORY OF PRESENT ILLNESS  12/20/22 3:05 AM Melanie Moreno is a 47 y.o. female with 4 days of left-sided neck pain radiating into her left shoulder and arm.  She has been unable to sleep due to the pain which she rates as an 8 out of 10.  Pain is worse with movement of the neck.  When she wakes up from sleep she feels numbness in the tips of the fingers of both hands although she does not feel pain in the right upper extremity.  She has oxycodone tablets at home from when she had pancreatitis but only tolerates a few milligrams at a time and so has not been taking full tablets.  She has gotten relief with Soma in the past, and would rather try that then any additional opioids.  She has also been taking ibuprofen without adequate relief.   Past Medical History:  Diagnosis Date   Anemia    Hypertension    Irregular heart rate    Ovarian cyst     Past Surgical History:  Procedure Laterality Date   APPENDECTOMY     BREAST ENHANCEMENT SURGERY      No family history on file.  Social History   Tobacco Use   Smoking status: Every Day    Packs/day: 1.00    Types: Cigarettes   Smokeless tobacco: Never  Substance Use Topics   Alcohol use: Not Currently    Comment: occassional   Drug use: Never    Prior to Admission medications   Medication Sig Start Date End Date Taking? Authorizing Provider  carisoprodol (SOMA) 250 MG tablet Take 1 tablet (250 mg total) by mouth 4 (four) times daily as needed (muscle spasms). 12/20/22  Yes Gertude Benito, MD  amLODipine (NORVASC) 5 MG tablet Take 1 tablet (5 mg total) by mouth daily. 07/25/22   Hosie Poisson, MD  calcium carbonate (TUMS EX) 750 MG chewable tablet Chew 1 tablet (750 mg total) by mouth daily as needed for heartburn. 07/25/22   Hosie Poisson, MD  metoprolol tartrate  (LOPRESSOR) 50 MG tablet Take 1 tablet (50 mg total) by mouth 2 (two) times daily. 12/20/22 03/20/23  Helton Oleson, Jenny Reichmann, MD  nicotine polacrilex (NICORETTE) 2 MG gum Chew 1 each (2 mg total) by mouth as needed for smoking cessation. 07/25/22   Hosie Poisson, MD  pantoprazole (PROTONIX) 40 MG tablet Take 1 tablet (40 mg total) by mouth 2 (two) times daily. 07/25/22 10/23/22  Hosie Poisson, MD  QUEtiapine (SEROQUEL) 50 MG tablet Take 1 tablet (50 mg total) by mouth at bedtime. 07/25/22 10/23/22  Hosie Poisson, MD    Allergies Dilaudid [hydromorphone hcl]   REVIEW OF SYSTEMS  Negative except as noted here or in the History of Present Illness.   PHYSICAL EXAMINATION  Initial Vital Signs Blood pressure (!) 157/110, pulse 99, temperature 98.2 F (36.8 C), temperature source Oral, resp. rate 18, weight 56.2 kg, last menstrual period 11/29/2022, SpO2 99 %.  Examination General: Well-developed, well-nourished female in no acute distress; appearance consistent with age of record HENT: normocephalic; atraumatic Eyes: pupils equal, round and reactive to light; extraocular muscles intact Neck: supple; movement of neck reproduces pain Heart: regular rate and rhythm Lungs: clear to auscultation bilaterally Abdomen: soft; nondistended; nontender; bowel sounds present Extremities: No deformity; full range of motion; pulses normal Neurologic: Awake, alert  and oriented; motor function intact in all extremities and symmetric; no facial droop Skin: Warm and dry Psychiatric: Tearful   RESULTS  Summary of this visit's results, reviewed and interpreted by myself:   EKG Interpretation  Date/Time:    Ventricular Rate:    PR Interval:    QRS Duration:   QT Interval:    QTC Calculation:   R Axis:     Text Interpretation:         Laboratory Studies: No results found for this or any previous visit (from the past 24 hour(s)). Imaging Studies: No results found.  ED COURSE and MDM  Nursing notes, initial  and subsequent vitals signs, including pulse oximetry, reviewed and interpreted by myself.  Vitals:   12/20/22 0114 12/20/22 0115 12/20/22 0230  BP: (!) 179/121  (!) 157/110  Pulse: 97  99  Resp: 18  18  Temp: 98.2 F (36.8 C)    TempSrc: Oral    SpO2: 100%  99%  Weight:  56.2 kg    Medications - No data to display  The presentation is consistent with cervical radiculopathy.  Although the specific level is unclear, she seems to localize the neck pain to her lower cervical spine.  She states her neck feels swollen although I do not observe or palpate any swelling.  She would like a referral to a chiropractor and I will refer her to Dr. Jimmye Norman on [address]  I will also refer her to neurosurgery.  We will trial a brief course of Soma as well.  The patient is requesting a refill on her metoprolol.  PROCEDURES  Procedures   ED DIAGNOSES     ICD-10-CM   1. Cervical radiculopathy  M54.12     2. Essential hypertension  I10 metoprolol tartrate (LOPRESSOR) 50 MG tablet         Talynn Lebon, MD 12/20/22 [phone]

## 2023-02-05 ENCOUNTER — Encounter: Payer: Self-pay | Admitting: *Deleted

## 2023-03-27 ENCOUNTER — Emergency Department (HOSPITAL_BASED_OUTPATIENT_CLINIC_OR_DEPARTMENT_OTHER)
Admission: EM | Admit: 2023-03-27 | Discharge: 2023-03-27 | Disposition: A | Discharge status: Home / Self Care | Payer: Medicaid Other | Attending: Emergency Medicine | Admitting: Emergency Medicine

## 2023-03-27 ENCOUNTER — Other Ambulatory Visit: Payer: Self-pay

## 2023-03-27 ENCOUNTER — Encounter (HOSPITAL_BASED_OUTPATIENT_CLINIC_OR_DEPARTMENT_OTHER): Payer: Self-pay | Admitting: *Deleted

## 2023-03-27 DIAGNOSIS — Z760 Encounter for issue of repeat prescription: Secondary | ICD-10-CM

## 2023-03-27 DIAGNOSIS — I10 Essential (primary) hypertension: Secondary | ICD-10-CM

## 2023-03-27 MED ORDER — METOPROLOL TARTRATE 50 MG PO TABS: 50.0000 mg | ORAL_TABLET | Freq: Two times a day (BID) | ORAL | 2 refills | Status: DC

## 2023-03-27 NOTE — ED Notes (Signed)
Patient verbalizes understanding of discharge instructions. Opportunity for questioning and answers were provided. Patient discharged from ED.  °

## 2023-03-27 NOTE — ED Triage Notes (Signed)
Pt is here for medication refill.  She is out of her metoprolol 50mg  bid.  She does not have a PCP and was unable to get in with the health department.

## 2023-03-27 NOTE — ED Provider Notes (Signed)
[place] EMERGENCY DEPARTMENT AT [facility] Provider Note   CSN: [id number] Arrival date & time: 03/27/23  1353     History  Chief Complaint  Patient presents with   Medication Refill    Melanie Moreno is a 47 y.o. female with a PMHx of HTN who presents to the ED with concerns for medication refill onset PTA. Pt notes that she ran out of her metoprolol. Doesn't have a PCP at this time. Denies trouble breathing, chest pain.   The history is provided by the patient. No language interpreter was used.       Home Medications Prior to Admission medications   Medication Sig Start Date End Date Taking? Authorizing Provider  amLODipine (NORVASC) 5 MG tablet Take 1 tablet (5 mg total) by mouth daily. 07/25/22   Kathlen Mody, MD  calcium carbonate (TUMS EX) 750 MG chewable tablet Chew 1 tablet (750 mg total) by mouth daily as needed for heartburn. 07/25/22   Kathlen Mody, MD  carisoprodol (SOMA) 250 MG tablet Take 1 tablet (250 mg total) by mouth 4 (four) times daily as needed (muscle spasms). 12/20/22   Molpus, John, MD  metoprolol tartrate (LOPRESSOR) 50 MG tablet Take 1 tablet (50 mg total) by mouth 2 (two) times daily. 03/27/23 06/25/23  Onelia Cadmus A, PA-C  nicotine polacrilex (NICORETTE) 2 MG gum Chew 1 each (2 mg total) by mouth as needed for smoking cessation. 07/25/22   Kathlen Mody, MD  pantoprazole (PROTONIX) 40 MG tablet Take 1 tablet (40 mg total) by mouth 2 (two) times daily. 07/25/22 10/23/22  Kathlen Mody, MD  QUEtiapine (SEROQUEL) 50 MG tablet Take 1 tablet (50 mg total) by mouth at bedtime. 07/25/22 10/23/22  Kathlen Mody, MD      Allergies    Dilaudid [hydromorphone hcl]    Review of Systems   Review of Systems  All other systems reviewed and are negative.   Physical Exam Updated Vital Signs BP (!) 158/106 Comment: pt is out of BP meds, took 1/2 yesterday and 1/2 today  Pulse 84   Temp 98.5 F (36.9 C) (Oral)   Resp 14   SpO2 98%  Physical Exam Vitals and  nursing note reviewed.  Constitutional:      General: She is not in acute distress.    Appearance: Normal appearance.  Eyes:     General: No scleral icterus.    Extraocular Movements: Extraocular movements intact.  Cardiovascular:     Rate and Rhythm: Normal rate and regular rhythm.     Pulses: Normal pulses.     Heart sounds: Normal heart sounds.  Pulmonary:     Effort: Pulmonary effort is normal. No respiratory distress.     Breath sounds: Normal breath sounds.  Abdominal:     Palpations: Abdomen is soft. There is no mass.     Tenderness: There is no abdominal tenderness.  Musculoskeletal:        General: Normal range of motion.     Cervical back: Neck supple.  Skin:    General: Skin is warm and dry.     Findings: No rash.  Neurological:     Mental Status: She is alert.     Sensory: Sensation is intact.     Motor: Motor function is intact.  Psychiatric:        Behavior: Behavior normal.     ED Results / Procedures / Treatments   Labs (all labs ordered are listed, but only abnormal results are displayed) Labs Reviewed -  No data to display  EKG None  Radiology No results found.  Procedures Procedures    Medications Ordered in ED Medications - No data to display  ED Course/ Medical Decision Making/ A&P Clinical Course as of 03/27/23 1508  Wed Mar 27, 2023  1500 Discussed with patient discharge treatment plan. Answered all available questions. Pt appears safe for discharge at this time.  [SB]    Clinical Course User Index [SB] Maddix Heinz A, PA-C                             Medical Decision Making Risk Prescription drug management.   Pt presents with concerns for medication refill.  Medications not a controlled substance.  Patient afebrile, not tachycardic or hypoxic.  On exam patient without acute cardiovascular respiratory exam findings.   Co morbidities that complicate the patient evaluation: HTN  Social Determinants of Health: Pt doesn't  have a PCP   Disposition: Presenting suspicious for need for medication refill. After consideration of the diagnostic results and the patients response to treatment, I feel that the patient would benefit from Discharge home.  Metoprolol refilled in the emergency department today.  Patient provided with primary care provider follow-up for 04/09/2023.  Supportive care measures and strict return precautions discussed with patient at bedside. Pt acknowledges and verbalizes understanding. Pt appears safe for discharge. Follow up as indicated in discharge paperwork.    This chart was dictated using voice recognition software, Dragon. Despite the best efforts of this provider to proofread and correct errors, errors may still occur which can change documentation meaning.   Final Clinical Impression(s) / ED Diagnoses Final diagnoses:  Encounter for medication refill    Rx / DC Orders ED Discharge Orders          Ordered    metoprolol tartrate (LOPRESSOR) 50 MG tablet  2 times daily        03/27/23 1449              Mattelyn Imhoff A, PA-C 03/27/23 1510    Linwood Dibbles, MD 03/28/23 [phone]

## 2023-03-27 NOTE — Discharge Instructions (Addendum)
It was a pleasure taking care of you today!   Your metoprolol was refilled today. You have an appointment with a PCP scheduled. Return to the ED if you were experiencing increasing/worsening symptoms.

## 2023-04-09 ENCOUNTER — Ambulatory Visit: Payer: Medicaid Other | Admitting: Internal Medicine

## 2023-04-21 ENCOUNTER — Emergency Department (HOSPITAL_BASED_OUTPATIENT_CLINIC_OR_DEPARTMENT_OTHER)
Admission: EM | Admit: 2023-04-21 | Discharge: 2023-04-21 | Disposition: A | Discharge status: Home / Self Care | Payer: Medicaid Other | Attending: Emergency Medicine | Admitting: Emergency Medicine

## 2023-04-21 ENCOUNTER — Encounter (HOSPITAL_BASED_OUTPATIENT_CLINIC_OR_DEPARTMENT_OTHER): Payer: Self-pay

## 2023-04-21 ENCOUNTER — Emergency Department (HOSPITAL_BASED_OUTPATIENT_CLINIC_OR_DEPARTMENT_OTHER): Payer: Medicaid Other | Admitting: Radiology

## 2023-04-21 ENCOUNTER — Other Ambulatory Visit: Payer: Self-pay

## 2023-04-21 DIAGNOSIS — Z79899 Other long term (current) drug therapy: Secondary | ICD-10-CM | POA: Insufficient documentation

## 2023-04-21 DIAGNOSIS — R030 Elevated blood-pressure reading, without diagnosis of hypertension: Secondary | ICD-10-CM

## 2023-04-21 DIAGNOSIS — I10 Essential (primary) hypertension: Secondary | ICD-10-CM | POA: Insufficient documentation

## 2023-04-21 LAB — TROPONIN I (HIGH SENSITIVITY)
Troponin I (High Sensitivity): 2 ng/L (ref <18)
Troponin I (High Sensitivity): 2 ng/L (ref <18)

## 2023-04-21 LAB — CBC
HCT: 40.2 % (ref 36.0–46.0)
Hemoglobin: 14.1 g/dL (ref 12.0–15.0)
MCH: 35.3 pg — ABNORMAL HIGH (ref 26.0–34.0)
MCHC: 35.1 g/dL (ref 30.0–36.0)
MCV: 100.8 fL — ABNORMAL HIGH (ref 80.0–100.0)
Platelets: 202 10*3/uL (ref 150–400)
RBC: 3.99 MIL/uL (ref 3.87–5.11)
RDW: 13.3 % (ref 11.5–15.5)
WBC: 7.7 10*3/uL (ref 4.0–10.5)
nRBC: 0 % (ref 0.0–0.2)

## 2023-04-21 LAB — BASIC METABOLIC PANEL
Anion gap: 11 (ref 5–15)
BUN: 8 mg/dL (ref 6–20)
CO2: 25 mmol/L (ref 22–32)
Calcium: 9.6 mg/dL (ref 8.9–10.3)
Chloride: 99 mmol/L (ref 98–111)
Creatinine, Ser: 0.56 mg/dL (ref 0.44–1.00)
GFR, Estimated: >60 mL/min (ref >60)
Glucose, Bld: 121 mg/dL — ABNORMAL HIGH (ref 70–99)
Potassium: 4 mmol/L (ref 3.5–5.1)
Sodium: 135 mmol/L (ref 135–145)

## 2023-04-21 LAB — LIPASE, BLOOD: Lipase: <10 U/L — ABNORMAL LOW (ref 11–51)

## 2023-04-21 LAB — PREGNANCY, URINE: Preg Test, Ur: NEGATIVE

## 2023-04-21 MED ORDER — HYDRALAZINE HCL 10 MG PO TABS: 10.0000 mg | ORAL_TABLET | Freq: Three times a day (TID) | ORAL | 0 refills | Status: DC | PRN

## 2023-04-21 MED ORDER — METOPROLOL TARTRATE 50 MG PO TABS: 25.0000 mg | ORAL_TABLET | Freq: Two times a day (BID) | ORAL | 0 refills | Status: DC

## 2023-04-21 NOTE — ED Notes (Signed)
Called lab to add Lipase, spoke with Bobbie.

## 2023-04-21 NOTE — ED Provider Notes (Signed)
[place] EMERGENCY DEPARTMENT AT [facility] Provider Note   CSN: [id number] Arrival date & time: 04/21/23  1628     History  Chief Complaint  Patient presents with   Hypertension    Melanie Moreno is a 47 y.o. female.  The history is provided by the patient and medical records. No language interpreter was used.  Hypertension This is a chronic problem. The current episode started yesterday. The problem occurs constantly. The problem has not changed since onset.Associated symptoms include abdominal pain (upper abd tightness and nausea). Pertinent negatives include no chest pain, no headaches and no shortness of breath. Nothing aggravates the symptoms. Nothing relieves the symptoms. She has tried nothing for the symptoms. The treatment provided no relief.       Home Medications Prior to Admission medications   Medication Sig Start Date End Date Taking? Authorizing Provider  amLODipine (NORVASC) 5 MG tablet Take 1 tablet (5 mg total) by mouth daily. 07/25/22   Kathlen Mody, MD  calcium carbonate (TUMS EX) 750 MG chewable tablet Chew 1 tablet (750 mg total) by mouth daily as needed for heartburn. 07/25/22   Kathlen Mody, MD  carisoprodol (SOMA) 250 MG tablet Take 1 tablet (250 mg total) by mouth 4 (four) times daily as needed (muscle spasms). 12/20/22   Molpus, John, MD  metoprolol tartrate (LOPRESSOR) 50 MG tablet Take 1 tablet (50 mg total) by mouth 2 (two) times daily. 03/27/23 06/25/23  Blue, Soijett A, PA-C  nicotine polacrilex (NICORETTE) 2 MG gum Chew 1 each (2 mg total) by mouth as needed for smoking cessation. 07/25/22   Kathlen Mody, MD  pantoprazole (PROTONIX) 40 MG tablet Take 1 tablet (40 mg total) by mouth 2 (two) times daily. 07/25/22 10/23/22  Kathlen Mody, MD  QUEtiapine (SEROQUEL) 50 MG tablet Take 1 tablet (50 mg total) by mouth at bedtime. 07/25/22 10/23/22  Kathlen Mody, MD      Allergies    Dilaudid [hydromorphone hcl]    Review of Systems   Review of Systems   Constitutional:  Positive for fatigue. Negative for chills and fever.  HENT:  Negative for congestion.   Eyes:  Negative for visual disturbance.  Respiratory:  Negative for cough, chest tightness and shortness of breath.   Cardiovascular:  Negative for chest pain and palpitations.  Gastrointestinal:  Positive for abdominal pain (upper abd tightness and nausea) and nausea. Negative for constipation, diarrhea and vomiting.  Genitourinary:  Negative for dysuria.  Musculoskeletal:  Negative for back pain, neck pain and neck stiffness.  Skin:  Negative for rash and wound.  Neurological:  Negative for syncope, weakness, light-headedness and headaches.  Psychiatric/Behavioral:  Negative for agitation.   All other systems reviewed and are negative.   Physical Exam Updated Vital Signs BP (!) 149/107 (BP Location: Right Arm)   Pulse 100   Temp 98.3 F (36.8 C) (Oral)   Resp 18   Ht 5\' 4"  (1.626 m)   Wt 53.1 kg   LMP 04/07/2023 (Approximate)   SpO2 100%   BMI 20.08 kg/m  Physical Exam Vitals and nursing note reviewed.  Constitutional:      General: She is not in acute distress.    Appearance: She is well-developed. She is not ill-appearing, toxic-appearing or diaphoretic.  HENT:     Head: Normocephalic and atraumatic.     Nose: No congestion or rhinorrhea.     Mouth/Throat:     Mouth: Mucous membranes are moist.     Pharynx: No oropharyngeal exudate or  posterior oropharyngeal erythema.  Eyes:     Extraocular Movements: Extraocular movements intact.     Conjunctiva/sclera: Conjunctivae normal.     Pupils: Pupils are equal, round, and reactive to light.  Cardiovascular:     Rate and Rhythm: Normal rate and regular rhythm.     Pulses: Normal pulses.     Heart sounds: No murmur heard. Pulmonary:     Effort: Pulmonary effort is normal. No respiratory distress.     Breath sounds: Normal breath sounds. No wheezing, rhonchi or rales.  Chest:     Chest wall: No tenderness.   Abdominal:     General: Abdomen is flat.     Palpations: Abdomen is soft.     Tenderness: There is no abdominal tenderness. There is no guarding or rebound.  Musculoskeletal:        General: No swelling or tenderness.     Cervical back: Neck supple. No tenderness.     Right lower leg: No edema.     Left lower leg: No edema.  Skin:    General: Skin is warm and dry.     Capillary Refill: Capillary refill takes less than 2 seconds.     Findings: No erythema or rash.  Neurological:     General: No focal deficit present.     Mental Status: She is alert.     Sensory: No sensory deficit.     Motor: No weakness.  Psychiatric:        Mood and Affect: Mood normal.     ED Results / Procedures / Treatments   Labs (all labs ordered are listed, but only abnormal results are displayed) Labs Reviewed  BASIC METABOLIC PANEL - Abnormal; Notable for the following components:      Result Value   Glucose, Bld 121 (*)    All other components within normal limits  CBC - Abnormal; Notable for the following components:   MCV 100.8 (*)    MCH 35.3 (*)    All other components within normal limits  LIPASE, BLOOD - Abnormal; Notable for the following components:   Lipase <10 (*)    All other components within normal limits  PREGNANCY, URINE  TROPONIN I (HIGH SENSITIVITY)  TROPONIN I (HIGH SENSITIVITY)    EKG EKG Interpretation Date/Time:  Sunday April 21 2023 16:40:39 EDT Ventricular Rate:  109 PR Interval:  138 QRS Duration:  76 QT Interval:  348 QTC Calculation: 468 R Axis:   78  Text Interpretation: Sinus tachycardia Otherwise normal ECG When compared with ECG of 20-Jul-2022 22:40, No significant change was found when compared to prior, faster rate. No STEMI Confirmed by Theda Belfast ([id number]) on 04/21/2023 4:51:41 PM  Radiology DG Chest 2 View  Result Date: 04/21/2023 CLINICAL DATA:  Chest pain and shortness of breath EXAM: CHEST - 2 VIEW COMPARISON:  None Available. FINDINGS: The  cardiomediastinal silhouette is unchanged in contour. No focal pulmonary opacity. No pleural effusion or pneumothorax. The visualized upper abdomen is unremarkable. No acute osseous abnormality. IMPRESSION: No active cardiopulmonary disease. Electronically Signed   By: Jacob Moores M.D.   On: 04/21/2023 16:59    Procedures Procedures    Medications Ordered in ED Medications - No data to display  ED Course/ Medical Decision Making/ A&P                             Medical Decision Making Amount and/or Complexity of Data Reviewed Labs: ordered. Radiology:  ordered.    Teagann Cangemi is a 47 y.o. female with a past medical history significant for previous pancreatitis, hypertension, and migraines who presents with elevated blood pressure, lightheadedness, fatigue, some nausea, and cramping.  According to patient, this is how she feels when her blood pressure is getting elevated.  She says that she ran out of her metoprolol yesterday and has been taking extra doses.  She said that previously she was on 100 mg of metoprolol twice a day but was back down to 50 mg twice a day recently.  She reports last day or 2 she is taken 3 pills instead of 2 and her blood pressure still been more elevated.  She said that her blood pressure was in the 180s today and she did not have any more metoprolol as she cannot fill her prescription until next week.  She said that when she gets elevated blood pressure she gets lightheaded, fatigue, upper abdominal cramping, and nausea.  She denies any chest pain palpitations or shortness of breath at this time.  Denies any actual vomiting, constipation, diarrhea, or urinary changes.  Denies any focal neurologic complaints.  She denies focal headache, neck pain, or neck stiffness.  She says that when she is feeling the symptoms and notes her blood pressure is up she gets more anxious and this drives her blood pressure higher.  She otherwise denies recent alcohol intake and no  recent pancreatitis.  On exam, lungs clear.  Chest nontender.  No murmur.  Abdomen nontender.  Good bowel sounds.  Back and flanks nontender.  No focal neurologic deficits.  Blood pressure initially in the 150s but has now improved into the 140s.  Patient is very concerned about her blood pressure.  Pupils are symmetric and reactive with normal extract movements and exam otherwise unremarkable.  Had a shared decision conversation with patient.  As patient is out of her metoprolol, dissipate refill of medication.  I also had discussion about giving her a short prescription for some hydralazine to use as a rescue medication if is getting over 180 and not responding to her home medication of metoprolol.  We did feel that she needs to follow-up with her PCP for better blood pressure medication titration and control.  She said that she was working in the kitchen without air conditioning today and thinks that also led to her feeling lightheaded and fatigued.  Patient and I discussed IV placement for IV hydration but she would rather orally hydrate here right now.  Will give oral fluids and get a delta troponin as her first troponin was normal.  Other labs also reassuring initially.  If delta troponin and lipase are reassuring, anticipate discharge for outpatient follow-up.  Labs returned overall reassuring.  Lipase not elevated.  Delta troponin negative.  CBC and CMP overall reassuring.  Chest x-ray reassuring.  Blood pressure has improved since she has been here.  We feel she is safe for discharge home.  We will give prescription for her metoprolol but will also give her a prescription for some hydralazine to use if that is getting over 180 and not responding to the home medications.  She will follow-up with a primary doctor and we will discuss outpatient titration of blood pressure medications.  Patient agrees with plan of care and was discharged in good condition with improvement in symptoms and with  improvement in blood pressure.            Final Clinical Impression(s) / ED Diagnoses Final diagnoses:  Elevated blood pressure reading    Rx / DC Orders ED Discharge Orders          Ordered    metoprolol tartrate (LOPRESSOR) 50 MG tablet  2 times daily        04/21/23 1958    hydrALAZINE (APRESOLINE) 10 MG tablet  3 times daily PRN        04/21/23 1958            Clinical Impression: 1. Elevated blood pressure reading     Disposition: Discharge  Condition: Good  I have discussed the results, Dx and Tx plan with the pt(& family if present). He/she/they expressed understanding and agree(s) with the plan. Discharge instructions discussed at great length. Strict return precautions discussed and pt &/or family have verbalized understanding of the instructions. No further questions at time of discharge.    New Prescriptions   HYDRALAZINE (APRESOLINE) 10 MG TABLET    Take 1 tablet (10 mg total) by mouth 3 (three) times daily as needed (If blood pressure over 180 and not responding to other medications.).   METOPROLOL TARTRATE (LOPRESSOR) 50 MG TABLET    Take 0.5 tablets (25 mg total) by mouth 2 (two) times daily.    Follow Up: [facility] [address] [place] [address] [place] [address] [phone] Schedule an appointment as soon as possible for a visit    Salli Real, MD [address] [place] [address] [phone]     [facility] Emergency Department at [facility] [address] [place] [address] [phone]       Edward Trevino, Canary Brim, MD 04/21/23 [phone]

## 2023-04-21 NOTE — Discharge Instructions (Signed)
Your history, exam, and evaluation today today are consistent with elevated blood pressure leading to your symptoms.  Your blood pressure improved while you were here and your labs were overall reassuring.  We feel you are safe for discharge home but please fill a prescription for the metoprolol that he ran out of and due to it being elevated and not responding as well recently, please consider filling the prescription for the hydralazine to use as a rescue medicine if blood pressure is persistently being over 180 and you are not having improvement with the medications.  Please rest and stay hydrated and follow-up with a primary doctor to discuss blood pressure management as an outpatient.  If any symptoms change or worsen acutely, please return to the nearest emergency department.

## 2023-04-21 NOTE — ED Triage Notes (Signed)
Pt reports she feels like her BP is high because she was not able to take her Metoprolol today; last took it last night. She also reports lightheadedness and chest tightness.

## 2023-05-19 ENCOUNTER — Encounter (HOSPITAL_BASED_OUTPATIENT_CLINIC_OR_DEPARTMENT_OTHER): Payer: Self-pay

## 2023-05-19 ENCOUNTER — Emergency Department (HOSPITAL_BASED_OUTPATIENT_CLINIC_OR_DEPARTMENT_OTHER)
Admission: EM | Admit: 2023-05-19 | Discharge: 2023-05-20 | Disposition: A | Discharge status: Home / Self Care | Payer: Medicaid Other | Attending: Emergency Medicine | Admitting: Emergency Medicine

## 2023-05-19 ENCOUNTER — Other Ambulatory Visit: Payer: Self-pay

## 2023-05-19 DIAGNOSIS — Z79899 Other long term (current) drug therapy: Secondary | ICD-10-CM | POA: Insufficient documentation

## 2023-05-19 DIAGNOSIS — R1084 Generalized abdominal pain: Secondary | ICD-10-CM

## 2023-05-19 DIAGNOSIS — I10 Essential (primary) hypertension: Secondary | ICD-10-CM | POA: Diagnosis not present

## 2023-05-19 DIAGNOSIS — K2970 Gastritis, unspecified, without bleeding: Secondary | ICD-10-CM | POA: Diagnosis not present

## 2023-05-19 DIAGNOSIS — R109 Unspecified abdominal pain: Secondary | ICD-10-CM | POA: Diagnosis present

## 2023-05-19 LAB — COMPREHENSIVE METABOLIC PANEL
ALT: 18 U/L (ref 0–44)
AST: 21 U/L (ref 15–41)
Albumin: 4.5 g/dL (ref 3.5–5.0)
Alkaline Phosphatase: 47 U/L (ref 38–126)
Anion gap: 11 (ref 5–15)
BUN: 19 mg/dL (ref 6–20)
CO2: 24 mmol/L (ref 22–32)
Calcium: 10.1 mg/dL (ref 8.9–10.3)
Chloride: 98 mmol/L (ref 98–111)
Creatinine, Ser: 1.03 mg/dL — ABNORMAL HIGH (ref 0.44–1.00)
GFR, Estimated: >60 mL/min (ref >60)
Glucose, Bld: 92 mg/dL (ref 70–99)
Potassium: 4 mmol/L (ref 3.5–5.1)
Sodium: 133 mmol/L — ABNORMAL LOW (ref 135–145)
Total Bilirubin: 0.4 mg/dL (ref 0.3–1.2)
Total Protein: 7.2 g/dL (ref 6.5–8.1)

## 2023-05-19 LAB — URINALYSIS, ROUTINE W REFLEX MICROSCOPIC
Bacteria, UA: NONE SEEN
Bilirubin Urine: NEGATIVE
Glucose, UA: NEGATIVE mg/dL
Hgb urine dipstick: NEGATIVE
Leukocytes,Ua: NEGATIVE
Nitrite: NEGATIVE
Specific Gravity, Urine: 1.024 (ref 1.005–1.030)
pH: 6 (ref 5.0–8.0)

## 2023-05-19 LAB — CBC
HCT: 38.4 % (ref 36.0–46.0)
Hemoglobin: 13.4 g/dL (ref 12.0–15.0)
MCH: 35.5 pg — ABNORMAL HIGH (ref 26.0–34.0)
MCHC: 34.9 g/dL (ref 30.0–36.0)
MCV: 101.9 fL — ABNORMAL HIGH (ref 80.0–100.0)
Platelets: 191 10*3/uL (ref 150–400)
RBC: 3.77 MIL/uL — ABNORMAL LOW (ref 3.87–5.11)
RDW: 13.4 % (ref 11.5–15.5)
WBC: 9.6 10*3/uL (ref 4.0–10.5)
nRBC: 0 % (ref 0.0–0.2)

## 2023-05-19 LAB — PREGNANCY, URINE: Preg Test, Ur: NEGATIVE

## 2023-05-19 LAB — LIPASE, BLOOD: Lipase: <10 U/L — ABNORMAL LOW (ref 11–51)

## 2023-05-19 MED ORDER — ONDANSETRON 4 MG PO TBDP
4.0000 mg | ORAL_TABLET | Freq: Once | ORAL | Status: AC | PRN
  Administered 2023-05-19: 4 mg via ORAL
  Filled 2023-05-19: qty 1

## 2023-05-19 NOTE — ED Triage Notes (Signed)
Right sided abdominal pain, intermittent and radiating to entire lower abdomen, endorses nausea and diarrhea, denies vomiting. Onset 3 days ago.

## 2023-05-20 ENCOUNTER — Emergency Department (HOSPITAL_BASED_OUTPATIENT_CLINIC_OR_DEPARTMENT_OTHER): Payer: Medicaid Other

## 2023-05-20 MED ORDER — SUCRALFATE 1 GM/10ML PO SUSP: 1.0000 g | Freq: Three times a day (TID) | ORAL | 0 refills | Status: DC

## 2023-05-20 MED ORDER — ONDANSETRON HCL 4 MG/2ML IJ SOLN
4.0000 mg | Freq: Once | INTRAMUSCULAR | Status: AC
  Administered 2023-05-20: 4 mg via INTRAVENOUS
  Filled 2023-05-20: qty 2

## 2023-05-20 MED ORDER — LACTATED RINGERS IV BOLUS
1000.0000 mL | Freq: Once | INTRAVENOUS | Status: AC
  Administered 2023-05-20: 1000 mL via INTRAVENOUS

## 2023-05-20 MED ORDER — ALUM & MAG HYDROXIDE-SIMETH 200-200-20 MG/5ML PO SUSP
30.0000 mL | Freq: Once | ORAL | Status: AC
  Administered 2023-05-20: 30 mL via ORAL
  Filled 2023-05-20: qty 30

## 2023-05-20 MED ORDER — IOHEXOL 300 MG/ML  SOLN
100.0000 mL | Freq: Once | INTRAMUSCULAR | Status: AC | PRN
  Administered 2023-05-20: 100 mL via INTRAVENOUS

## 2023-05-20 MED ORDER — FENTANYL CITRATE PF 50 MCG/ML IJ SOSY
50.0000 ug | PREFILLED_SYRINGE | Freq: Once | INTRAMUSCULAR | Status: AC
  Administered 2023-05-20: 50 ug via INTRAVENOUS
  Filled 2023-05-20: qty 1

## 2023-05-20 MED ORDER — PANTOPRAZOLE SODIUM 40 MG PO TBEC: 40.0000 mg | DELAYED_RELEASE_TABLET | Freq: Every day | ORAL | 0 refills | Status: DC

## 2023-05-20 NOTE — ED Notes (Signed)
MD at bedside for evaluation

## 2023-05-20 NOTE — ED Provider Notes (Signed)
[place] EMERGENCY DEPARTMENT AT [facility]  Provider Note  CSN: [id number] Arrival date & time: 05/19/23 2020  History Chief Complaint  Patient presents with   Abdominal Pain    Melanie Moreno is a 47 y.o. female with history of HTN, alcohol induced pancreatitis here with 3 days of intermittent cramping pain in R abdomen, radiates to the left. Associated with nausea and anorexia, but no vomiting.Has had some diarrhea. No bloody stools. Feels different than prior pancreatitis. Has had appendectomy.    Home Medications Prior to Admission medications   Medication Sig Start Date End Date Taking? Authorizing Provider  sucralfate (CARAFATE) 1 GM/10ML suspension Take 10 mLs (1 g total) by mouth 4 (four) times daily -  with meals and at bedtime. 05/20/23  Yes Pollyann Savoy, MD  amLODipine (NORVASC) 5 MG tablet Take 1 tablet (5 mg total) by mouth daily. 07/25/22   Kathlen Mody, MD  calcium carbonate (TUMS EX) 750 MG chewable tablet Chew 1 tablet (750 mg total) by mouth daily as needed for heartburn. 07/25/22   Kathlen Mody, MD  carisoprodol (SOMA) 250 MG tablet Take 1 tablet (250 mg total) by mouth 4 (four) times daily as needed (muscle spasms). 12/20/22   Molpus, Jonny Ruiz, MD  hydrALAZINE (APRESOLINE) 10 MG tablet Take 1 tablet (10 mg total) by mouth 3 (three) times daily as needed (If blood pressure over 180 and not responding to other medications.). 04/21/23   Tegeler, Canary Brim, MD  metoprolol tartrate (LOPRESSOR) 50 MG tablet Take 1 tablet (50 mg total) by mouth 2 (two) times daily. 03/27/23 06/25/23  Blue, Soijett A, PA-C  metoprolol tartrate (LOPRESSOR) 50 MG tablet Take 0.5 tablets (25 mg total) by mouth 2 (two) times daily. 04/21/23   Tegeler, Canary Brim, MD  nicotine polacrilex (NICORETTE) 2 MG gum Chew 1 each (2 mg total) by mouth as needed for smoking cessation. 07/25/22   Kathlen Mody, MD  pantoprazole (PROTONIX) 40 MG tablet Take 1 tablet (40 mg total) by mouth daily.  05/20/23 06/19/23  Pollyann Savoy, MD  QUEtiapine (SEROQUEL) 50 MG tablet Take 1 tablet (50 mg total) by mouth at bedtime. 07/25/22 10/23/22  Kathlen Mody, MD     Allergies    Dilaudid [hydromorphone hcl]   Review of Systems   Review of Systems Please see HPI for pertinent positives and negatives  Physical Exam BP (!) 138/107   Pulse 76   Temp 98.1 F (36.7 C) (Oral)   Resp 16   Ht 5\' 4"  (1.626 m)   Wt 54.4 kg   LMP 05/05/2023 (Approximate)   SpO2 98%   BMI 20.60 kg/m   Physical Exam Vitals and nursing note reviewed.  Constitutional:      Appearance: Normal appearance.  HENT:     Head: Normocephalic and atraumatic.     Nose: Nose normal.     Mouth/Throat:     Mouth: Mucous membranes are moist.  Eyes:     Extraocular Movements: Extraocular movements intact.     Conjunctiva/sclera: Conjunctivae normal.  Cardiovascular:     Rate and Rhythm: Normal rate.  Pulmonary:     Effort: Pulmonary effort is normal.     Breath sounds: Normal breath sounds.  Abdominal:     General: Abdomen is flat.     Palpations: Abdomen is soft.     Tenderness: There is abdominal tenderness in the right upper quadrant. There is no guarding. Negative signs include Murphy's sign and McBurney's sign.  Musculoskeletal:  General: No swelling. Normal range of motion.     Cervical back: Neck supple.  Skin:    General: Skin is warm and dry.  Neurological:     General: No focal deficit present.     Mental Status: She is alert.  Psychiatric:        Mood and Affect: Mood normal.     ED Results / Procedures / Treatments   EKG None  Procedures Procedures  Medications Ordered in the ED Medications  ondansetron (ZOFRAN-ODT) disintegrating tablet 4 mg (4 mg Oral Given 05/19/23 2042)  fentaNYL (SUBLIMAZE) injection 50 mcg (50 mcg Intravenous Given 05/20/23 0113)  ondansetron (ZOFRAN) injection 4 mg (4 mg Intravenous Given 05/20/23 0112)  lactated ringers bolus 1,000 mL (1,000 mLs  Intravenous New Bag/Given 05/20/23 0111)  iohexol (OMNIPAQUE) 300 MG/ML solution 100 mL (100 mLs Intravenous Contrast Given 05/20/23 0122)  alum & mag hydroxide-simeth (MAALOX/MYLANTA) 200-200-20 MG/5ML suspension 30 mL (30 mLs Oral Given 05/20/23 0222)    Initial Impression and Plan  Patient here with R sided abdominal pain, radiating to the left. Exam shows some tenderness but no peritoneal signs. Labs done in triage show CBC with normal WBC, CMP and lipase are unremarkable. UA is contaminated, but no urinary symptoms. Will give pain/nausea meds for comfort and send for CT to further evaluate.   ED Course   Clinical Course as of 05/20/23 0224  Mon May 20, 2023  0221 I personally viewed the images from radiology studies and agree with radiologist interpretation: CT is neg for acute process. There is thickening of the pylorus which may be contributing. She reports remote history of PUD, but does not have a regular GI doctor. Plan discharge with Protonix, carafate and referral to GI.  [CS]    Clinical Course User Index [CS] Pollyann Savoy, MD     MDM Rules/Calculators/A&P Medical Decision Making Problems Addressed: Gastritis without bleeding, unspecified chronicity, unspecified gastritis type: acute illness or injury Generalized abdominal pain: acute illness or injury  Amount and/or Complexity of Data Reviewed Labs: ordered. Decision-making details documented in ED Course. Radiology: ordered and independent interpretation performed. Decision-making details documented in ED Course.  Risk OTC drugs. Prescription drug management. Parenteral controlled substances.     Final Clinical Impression(s) / ED Diagnoses Final diagnoses:  Generalized abdominal pain  Gastritis without bleeding, unspecified chronicity, unspecified gastritis type    Rx / DC Orders ED Discharge Orders          Ordered    sucralfate (CARAFATE) 1 GM/10ML suspension  3 times daily with meals & bedtime         05/20/23 0224    pantoprazole (PROTONIX) 40 MG tablet  Daily        05/20/23 0224             Pollyann Savoy, MD 05/20/23 [phone]

## 2023-07-05 ENCOUNTER — Emergency Department (HOSPITAL_BASED_OUTPATIENT_CLINIC_OR_DEPARTMENT_OTHER): Payer: Medicaid Other

## 2023-07-05 ENCOUNTER — Emergency Department (HOSPITAL_BASED_OUTPATIENT_CLINIC_OR_DEPARTMENT_OTHER)
Admission: EM | Admit: 2023-07-05 | Discharge: 2023-07-05 | Disposition: A | Discharge status: Home / Self Care | Payer: Medicaid Other | Attending: Emergency Medicine | Admitting: Emergency Medicine

## 2023-07-05 ENCOUNTER — Encounter (HOSPITAL_BASED_OUTPATIENT_CLINIC_OR_DEPARTMENT_OTHER): Payer: Self-pay | Admitting: Emergency Medicine

## 2023-07-05 ENCOUNTER — Other Ambulatory Visit: Payer: Self-pay

## 2023-07-05 DIAGNOSIS — I10 Essential (primary) hypertension: Secondary | ICD-10-CM | POA: Diagnosis not present

## 2023-07-05 DIAGNOSIS — R002 Palpitations: Secondary | ICD-10-CM | POA: Diagnosis not present

## 2023-07-05 DIAGNOSIS — R072 Precordial pain: Secondary | ICD-10-CM

## 2023-07-05 DIAGNOSIS — R030 Elevated blood-pressure reading, without diagnosis of hypertension: Secondary | ICD-10-CM

## 2023-07-05 DIAGNOSIS — R110 Nausea: Secondary | ICD-10-CM | POA: Insufficient documentation

## 2023-07-05 DIAGNOSIS — Z79899 Other long term (current) drug therapy: Secondary | ICD-10-CM | POA: Diagnosis not present

## 2023-07-05 DIAGNOSIS — R000 Tachycardia, unspecified: Secondary | ICD-10-CM | POA: Diagnosis not present

## 2023-07-05 LAB — CBC
HCT: 40.9 % (ref 36.0–46.0)
Hemoglobin: 14.4 g/dL (ref 12.0–15.0)
MCH: 36.1 pg — ABNORMAL HIGH (ref 26.0–34.0)
MCHC: 35.2 g/dL (ref 30.0–36.0)
MCV: 102.5 fL — ABNORMAL HIGH (ref 80.0–100.0)
Platelets: 212 10*3/uL (ref 150–400)
RBC: 3.99 MIL/uL (ref 3.87–5.11)
RDW: 13.2 % (ref 11.5–15.5)
WBC: 8.8 10*3/uL (ref 4.0–10.5)
nRBC: 0 % (ref 0.0–0.2)

## 2023-07-05 LAB — LIPASE, BLOOD: Lipase: <10 U/L — ABNORMAL LOW (ref 11–51)

## 2023-07-05 LAB — COMPREHENSIVE METABOLIC PANEL
ALT: 28 U/L (ref 0–44)
AST: 43 U/L — ABNORMAL HIGH (ref 15–41)
Albumin: 4.5 g/dL (ref 3.5–5.0)
Alkaline Phosphatase: 54 U/L (ref 38–126)
Anion gap: 14 (ref 5–15)
BUN: 9 mg/dL (ref 6–20)
CO2: 24 mmol/L (ref 22–32)
Calcium: 9.6 mg/dL (ref 8.9–10.3)
Chloride: 98 mmol/L (ref 98–111)
Creatinine, Ser: 0.6 mg/dL (ref 0.44–1.00)
GFR, Estimated: >60 mL/min (ref >60)
Glucose, Bld: 75 mg/dL (ref 70–99)
Potassium: 3.5 mmol/L (ref 3.5–5.1)
Sodium: 136 mmol/L (ref 135–145)
Total Bilirubin: 0.4 mg/dL (ref 0.3–1.2)
Total Protein: 7.5 g/dL (ref 6.5–8.1)

## 2023-07-05 LAB — D-DIMER, QUANTITATIVE: D-Dimer, Quant: 0.29 ug{FEU}/mL (ref 0.00–0.50)

## 2023-07-05 LAB — TROPONIN I (HIGH SENSITIVITY): Troponin I (High Sensitivity): 3 ng/L (ref <18)

## 2023-07-05 MED ORDER — ACETAMINOPHEN 500 MG PO TABS
1000.0000 mg | ORAL_TABLET | Freq: Once | ORAL | Status: AC
  Administered 2023-07-05: 1000 mg via ORAL
  Filled 2023-07-05: qty 2

## 2023-07-05 MED ORDER — LACTATED RINGERS IV BOLUS
1000.0000 mL | Freq: Once | INTRAVENOUS | Status: AC
  Administered 2023-07-05: 1000 mL via INTRAVENOUS

## 2023-07-05 MED ORDER — ONDANSETRON HCL 4 MG/2ML IJ SOLN
4.0000 mg | Freq: Once | INTRAMUSCULAR | Status: AC
  Administered 2023-07-05: 4 mg via INTRAVENOUS
  Filled 2023-07-05: qty 2

## 2023-07-05 MED ORDER — METOPROLOL TARTRATE 25 MG PO TABS
50.0000 mg | ORAL_TABLET | Freq: Once | ORAL | Status: AC
  Administered 2023-07-05: 50 mg via ORAL
  Filled 2023-07-05: qty 2

## 2023-07-05 MED ORDER — METOPROLOL TARTRATE 50 MG PO TABS: 50.0000 mg | ORAL_TABLET | Freq: Two times a day (BID) | ORAL | 1 refills | Status: DC

## 2023-07-05 NOTE — ED Provider Notes (Signed)
EMERGENCY DEPARTMENT AT [facility] Provider Note   CSN: [id number] Arrival date & time: 07/05/23  1524     History  Chief Complaint  Patient presents with   Chest Pain    Melanie Moreno is a 47 y.o. female.  Pt with c/o chest discomfort in past 1-2 days, left lower chest, at rest, dull, non radiating, not worse w activity or exertion, not pleuritic. No associated vomiting, diaphoresis or sob. +nausea. +palpitations. No syncope. No hx cad or fam hx cad. No hx dvt or pe. No leg pain or swelling, no recent trauma, travel, surgery, or immobility. No chest wall injury or strain. No heartburn. No abd pain. No back/flank pain. No fever or chills. No new or worsening cough. Indicates ran out of bp med a couple days ago, but otherwise denies any change in meds.   The history is provided by the patient and medical records.  Chest Pain Associated symptoms: nausea and palpitations   Associated symptoms: no abdominal pain, no back pain, no fever, no shortness of breath and no vomiting        Home Medications Prior to Admission medications   Medication Sig Start Date End Date Taking? Authorizing Provider  amLODipine (NORVASC) 5 MG tablet Take 1 tablet (5 mg total) by mouth daily. 07/25/22   Kathlen Mody, MD  calcium carbonate (TUMS EX) 750 MG chewable tablet Chew 1 tablet (750 mg total) by mouth daily as needed for heartburn. 07/25/22   Kathlen Mody, MD  carisoprodol (SOMA) 250 MG tablet Take 1 tablet (250 mg total) by mouth 4 (four) times daily as needed (muscle spasms). 12/20/22   Molpus, Jonny Ruiz, MD  hydrALAZINE (APRESOLINE) 10 MG tablet Take 1 tablet (10 mg total) by mouth 3 (three) times daily as needed (If blood pressure over 180 and not responding to other medications.). 04/21/23   Tegeler, Canary Brim, MD  metoprolol tartrate (LOPRESSOR) 50 MG tablet Take 1 tablet (50 mg total) by mouth 2 (two) times daily. 03/27/23 06/25/23  Blue, Soijett A, PA-C  metoprolol tartrate  (LOPRESSOR) 50 MG tablet Take 0.5 tablets (25 mg total) by mouth 2 (two) times daily. 04/21/23   Tegeler, Canary Brim, MD  nicotine polacrilex (NICORETTE) 2 MG gum Chew 1 each (2 mg total) by mouth as needed for smoking cessation. 07/25/22   Kathlen Mody, MD  pantoprazole (PROTONIX) 40 MG tablet Take 1 tablet (40 mg total) by mouth daily. 05/20/23 06/19/23  Pollyann Savoy, MD  QUEtiapine (SEROQUEL) 50 MG tablet Take 1 tablet (50 mg total) by mouth at bedtime. 07/25/22 10/23/22  Kathlen Mody, MD  sucralfate (CARAFATE) 1 GM/10ML suspension Take 10 mLs (1 g total) by mouth 4 (four) times daily -  with meals and at bedtime. 05/20/23   Pollyann Savoy, MD      Allergies    Dilaudid [hydromorphone hcl]    Review of Systems   Review of Systems  Constitutional:  Negative for chills and fever.  HENT:  Negative for sore throat.   Eyes:  Negative for redness.  Respiratory:  Negative for shortness of breath.   Cardiovascular:  Positive for chest pain and palpitations. Negative for leg swelling.  Gastrointestinal:  Positive for nausea. Negative for abdominal pain, blood in stool and vomiting.       No rectal bleeding or melena.   Genitourinary:  Negative for dysuria and flank pain.  Musculoskeletal:  Negative for back pain and neck pain.  Skin:  Negative for rash.  Neurological:  Negative for syncope.       Mild intermittent frontal headache. No acute, abrupt or severe head pain.   Hematological:  Does not bruise/bleed easily.  Psychiatric/Behavioral:  Negative for confusion.     Physical Exam Updated Vital Signs BP (!) 154/108   Pulse 95   Temp 98.2 F (36.8 C) (Oral)   Resp 15   LMP 07/02/2023   SpO2 100%  Physical Exam Vitals and nursing note reviewed.  Constitutional:      Appearance: Normal appearance. She is well-developed.  HENT:     Head: Atraumatic.     Comments: No sinus or temporal tenderness.     Nose: Nose normal.     Mouth/Throat:     Mouth: Mucous membranes are  moist.  Eyes:     General: No scleral icterus.    Conjunctiva/sclera: Conjunctivae normal.     Pupils: Pupils are equal, round, and reactive to light.  Neck:     Trachea: No tracheal deviation.     Comments: No stiffness or rigidity.  Cardiovascular:     Rate and Rhythm: Regular rhythm. Tachycardia present.     Pulses: Normal pulses.     Heart sounds: Normal heart sounds. No murmur heard.    No friction rub. No gallop.  Pulmonary:     Effort: Pulmonary effort is normal. No respiratory distress.     Breath sounds: Normal breath sounds.     Comments: No chest wall sts, skin changes/lesions or other abnormality.  Chest:     Chest wall: No tenderness.  Abdominal:     General: Bowel sounds are normal. There is no distension.     Palpations: Abdomen is soft. There is no mass.     Tenderness: There is no abdominal tenderness. There is no guarding.  Genitourinary:    Comments: No cva tenderness.  Musculoskeletal:        General: No swelling or tenderness.     Cervical back: Normal range of motion and neck supple. No rigidity. No muscular tenderness.     Right lower leg: No edema.     Left lower leg: No edema.  Skin:    General: Skin is warm and dry.     Findings: No rash.  Neurological:     Mental Status: She is alert.     Comments: Alert, speech normal. Motor/sens grossly intact bi. Steady gait.   Psychiatric:        Mood and Affect: Mood normal.     ED Results / Procedures / Treatments   Labs (all labs ordered are listed, but only abnormal results are displayed) Results for orders placed or performed during the hospital encounter of 07/05/23  CBC  Result Value Ref Range   WBC 8.8 4.0 - 10.5 K/uL   RBC 3.99 3.87 - 5.11 MIL/uL   Hemoglobin 14.4 12.0 - 15.0 g/dL   HCT 83.4 19.6 - 22.2 %   MCV 102.5 (H) 80.0 - 100.0 fL   MCH 36.1 (H) 26.0 - 34.0 pg   MCHC 35.2 30.0 - 36.0 g/dL   RDW 97.9 89.2 - 11.9 %   Platelets 212 150 - 400 K/uL   nRBC 0.0 0.0 - 0.2 %  Comprehensive  metabolic panel  Result Value Ref Range   Sodium 136 135 - 145 mmol/L   Potassium 3.5 3.5 - 5.1 mmol/L   Chloride 98 98 - 111 mmol/L   CO2 24 22 - 32 mmol/L   Glucose, Bld 75 70 - 99 mg/dL  BUN 9 6 - 20 mg/dL   Creatinine, Ser 4.09 0.44 - 1.00 mg/dL   Calcium 9.6 8.9 - 81.1 mg/dL   Total Protein 7.5 6.5 - 8.1 g/dL   Albumin 4.5 3.5 - 5.0 g/dL   AST 43 (H) 15 - 41 U/L   ALT 28 0 - 44 U/L   Alkaline Phosphatase 54 38 - 126 U/L   Total Bilirubin 0.4 0.3 - 1.2 mg/dL   GFR, Estimated >91 >47 mL/min   Anion gap 14 5 - 15  D-dimer, quantitative  Result Value Ref Range   D-Dimer, Quant 0.29 0.00 - 0.50 ug/mL-FEU  Lipase, blood  Result Value Ref Range   Lipase <10 (L) 11 - 51 U/L  Troponin I (High Sensitivity)  Result Value Ref Range   Troponin I (High Sensitivity) 3 <18 ng/L     EKG EKG Interpretation Date/Time:  Friday July 05 2023 15:33:04 EDT Ventricular Rate:  115 PR Interval:  151 QRS Duration:  73 QT Interval:  337 QTC Calculation: 467 R Axis:   78  Text Interpretation: Sinus tachycardia Non-specific ST-t changes Confirmed by Cathren Laine ([id number]) on 07/05/2023 3:36:11 PM  Radiology DG Chest Port 1 View  Result Date: 07/05/2023 CLINICAL DATA:  Chest pain. EXAM: PORTABLE CHEST 1 VIEW COMPARISON:  April 21, 2023. FINDINGS: The heart size and mediastinal contours are within normal limits. Both lungs are clear. The visualized skeletal structures are unremarkable. IMPRESSION: No active disease. Electronically Signed   By: Lupita Raider M.D.   On: 07/05/2023 16:47    Procedures Procedures    Medications Ordered in ED Medications  lactated ringers bolus 1,000 mL (1,000 mLs Intravenous New Bag/Given 07/05/23 1610)  ondansetron (ZOFRAN) injection 4 mg (4 mg Intravenous Given 07/05/23 1610)  acetaminophen (TYLENOL) tablet 1,000 mg (1,000 mg Oral Given 07/05/23 1605)    ED Course/ Medical Decision Making/ A&P                                 Medical Decision  Making Problems Addressed: Elevated blood pressure reading: acute illness or injury Precordial chest pain: acute illness or injury with systemic symptoms that poses a threat to life or bodily functions Uncontrolled hypertension: chronic illness or injury with exacerbation, progression, or side effects of treatment that poses a threat to life or bodily functions  Amount and/or Complexity of Data Reviewed External Data Reviewed: notes. Labs: ordered. Decision-making details documented in ED Course. Radiology: ordered and independent interpretation performed. Decision-making details documented in ED Course. ECG/medicine tests: ordered and independent interpretation performed. Decision-making details documented in ED Course.  Risk OTC drugs. Prescription drug management. Decision regarding hospitalization.   Iv ns. Continuous pulse ox and cardiac monitoring. Labs ordered/sent. Imaging ordered.   Differential diagnosis includes acs, msk cp, gi cp, etc. Dispo decision including potential need for admission considered - will get labs and imaging and reassess.   Reviewed nursing notes and prior charts for additional history. External reports reviewed.   Cardiac monitor: sinus rhythm, rate 105.  LR bolus. Acetaminophen po. Zofran iv.   Labs reviewed/interpreted by me - wbc and hgb normal. Chem normal. After symptoms for past 1+ day, trop normal, felt not c/w acs, ddimer normal.   Xrays reviewed/interpreted by me - no pna.   Recheck pt  comfortable appearing, nad.    Bp elev, and out of med for two days, will give dose home meds and new rx.  Metoprolol po.  Clarified meds with pt - pt indicates she has only been on metoprolol for bp, indicates has not taken amlodipine or hydralazine in recent past. Also indicates no current pcp - will recommend pcp f/u.   Currently hr 88, rr 14, pulse ox 100%. No pain.   Pt currently appears stable for d/c.   Rec close pcp f/u.  Return precautions  provided.          Final Clinical Impression(s) / ED Diagnoses Final diagnoses:  None    Rx / DC Orders ED Discharge Orders     None         Cathren Laine, MD 07/05/23 1709

## 2023-07-05 NOTE — ED Triage Notes (Signed)
Ran out of BP medication ( metoprolol) 2 days ago. Took 2 doses of hydralazine today (her alt bp meds) Chest pain, headache worse today

## 2023-07-05 NOTE — Discharge Instructions (Signed)
It was our pleasure to provide your ER care today - we hope that you feel better.  Take your blood pressure medication as prescribed, limit salt intake, follow heart healthy meal plan. For blood pressure and other recent symptoms, follow up closely with primary care doctor in one week.   Return to ER if worse, new symptoms, fevers, new/severe pain, recurrent/persistent chest pain, increased trouble breathing, severe headache, or other concern.

## 2023-07-08 ENCOUNTER — Telehealth (HOSPITAL_BASED_OUTPATIENT_CLINIC_OR_DEPARTMENT_OTHER): Payer: Self-pay

## 2023-07-08 NOTE — Telephone Encounter (Signed)
LVM TO SCHEDULE HFU ESTABLISH PCP

## 2023-08-07 ENCOUNTER — Encounter (HOSPITAL_BASED_OUTPATIENT_CLINIC_OR_DEPARTMENT_OTHER): Payer: Self-pay | Admitting: Emergency Medicine

## 2023-08-07 ENCOUNTER — Other Ambulatory Visit: Payer: Self-pay

## 2023-08-07 ENCOUNTER — Emergency Department (HOSPITAL_BASED_OUTPATIENT_CLINIC_OR_DEPARTMENT_OTHER)
Admission: EM | Admit: 2023-08-07 | Discharge: 2023-08-07 | Disposition: A | Discharge status: Home / Self Care | Payer: Medicaid Other | Attending: Emergency Medicine | Admitting: Emergency Medicine

## 2023-08-07 ENCOUNTER — Emergency Department (HOSPITAL_BASED_OUTPATIENT_CLINIC_OR_DEPARTMENT_OTHER): Payer: Medicaid Other

## 2023-08-07 DIAGNOSIS — K1120 Sialoadenitis, unspecified: Secondary | ICD-10-CM | POA: Diagnosis not present

## 2023-08-07 DIAGNOSIS — R1310 Dysphagia, unspecified: Secondary | ICD-10-CM | POA: Diagnosis present

## 2023-08-07 LAB — CBC WITH DIFFERENTIAL/PLATELET
Abs Immature Granulocytes: 0.03 10*3/uL (ref 0.00–0.07)
Basophils Absolute: 0 10*3/uL (ref 0.0–0.1)
Basophils Relative: 0 %
Eosinophils Absolute: 0 10*3/uL (ref 0.0–0.5)
Eosinophils Relative: 0 %
HCT: 39.6 % (ref 36.0–46.0)
Hemoglobin: 14 g/dL (ref 12.0–15.0)
Immature Granulocytes: 0 %
Lymphocytes Relative: 15 %
Lymphs Abs: 1.7 10*3/uL (ref 0.7–4.0)
MCH: 36.4 pg — ABNORMAL HIGH (ref 26.0–34.0)
MCHC: 35.4 g/dL (ref 30.0–36.0)
MCV: 102.9 fL — ABNORMAL HIGH (ref 80.0–100.0)
Monocytes Absolute: 0.9 10*3/uL (ref 0.1–1.0)
Monocytes Relative: 8 %
Neutro Abs: 8.6 10*3/uL — ABNORMAL HIGH (ref 1.7–7.7)
Neutrophils Relative %: 77 %
Platelets: 183 10*3/uL (ref 150–400)
RBC: 3.85 MIL/uL — ABNORMAL LOW (ref 3.87–5.11)
RDW: 13.4 % (ref 11.5–15.5)
WBC: 11.3 10*3/uL — ABNORMAL HIGH (ref 4.0–10.5)
nRBC: 0 % (ref 0.0–0.2)

## 2023-08-07 LAB — BASIC METABOLIC PANEL
Anion gap: 9 (ref 5–15)
BUN: 9 mg/dL (ref 6–20)
CO2: 25 mmol/L (ref 22–32)
Calcium: 9.3 mg/dL (ref 8.9–10.3)
Chloride: 100 mmol/L (ref 98–111)
Creatinine, Ser: 0.57 mg/dL (ref 0.44–1.00)
GFR, Estimated: >60 mL/min (ref >60)
Glucose, Bld: 80 mg/dL (ref 70–99)
Potassium: 4.1 mmol/L (ref 3.5–5.1)
Sodium: 134 mmol/L — ABNORMAL LOW (ref 135–145)

## 2023-08-07 MED ORDER — KETOROLAC TROMETHAMINE 15 MG/ML IJ SOLN
15.0000 mg | Freq: Once | INTRAMUSCULAR | Status: AC
  Administered 2023-08-07: 15 mg via INTRAVENOUS
  Filled 2023-08-07: qty 1

## 2023-08-07 MED ORDER — MORPHINE SULFATE (PF) 4 MG/ML IV SOLN
4.0000 mg | Freq: Once | INTRAVENOUS | Status: AC
  Administered 2023-08-07: 4 mg via INTRAMUSCULAR
  Filled 2023-08-07: qty 1

## 2023-08-07 MED ORDER — METOPROLOL TARTRATE 50 MG PO TABS: 50.0000 mg | ORAL_TABLET | Freq: Two times a day (BID) | ORAL | 1 refills | Status: DC

## 2023-08-07 MED ORDER — AMOXICILLIN-POT CLAVULANATE 875-125 MG PO TABS: 1.0000 | ORAL_TABLET | Freq: Two times a day (BID) | ORAL | 0 refills | Status: AC

## 2023-08-07 MED ORDER — AMOXICILLIN-POT CLAVULANATE 875-125 MG PO TABS
1.0000 | ORAL_TABLET | Freq: Once | ORAL | Status: AC
  Administered 2023-08-07: 1 via ORAL
  Filled 2023-08-07: qty 1

## 2023-08-07 MED ORDER — IOHEXOL 300 MG/ML  SOLN
100.0000 mL | Freq: Once | INTRAMUSCULAR | Status: AC | PRN
  Administered 2023-08-07: 80 mL via INTRAVENOUS

## 2023-08-07 NOTE — ED Provider Notes (Signed)
[place] EMERGENCY DEPARTMENT AT [facility] Provider Note   CSN: [id number] Arrival date & time: 08/07/23  1616     History  No chief complaint on file.   Melanie Moreno is a 47 y.o. female.  Patient with history of pancreatitis presents to the emergency department today for evaluation of mouth pain and painful swallowing.  Symptoms started this morning when she woke up with mouth pain.  She states that initially she thought that she bit her tongue.  She went and looked in the mirror and noted a bump in the floor of the right side of the mouth.  This was very tender.  She used mouthwash and rinsed her mouth without any improvement.  During the course of the day she has had increasing pain and swelling as well as pain and swelling that has extended down the anterior portion of the right neck.  Pain is worse when she extends her neck.  She does not have trismus.  She is able to swallow but does state pain with swallowing.  She has had chills but no fevers.  She has never had any problems like this in the past.  No history of immunocompromise.       Home Medications Prior to Admission medications   Medication Sig Start Date End Date Taking? Authorizing Provider  amLODipine (NORVASC) 5 MG tablet Take 1 tablet (5 mg total) by mouth daily. 07/25/22   Kathlen Mody, MD  calcium carbonate (TUMS EX) 750 MG chewable tablet Chew 1 tablet (750 mg total) by mouth daily as needed for heartburn. 07/25/22   Kathlen Mody, MD  carisoprodol (SOMA) 250 MG tablet Take 1 tablet (250 mg total) by mouth 4 (four) times daily as needed (muscle spasms). 12/20/22   Molpus, Jonny Ruiz, MD  hydrALAZINE (APRESOLINE) 10 MG tablet Take 1 tablet (10 mg total) by mouth 3 (three) times daily as needed (If blood pressure over 180 and not responding to other medications.). 04/21/23   Tegeler, Canary Brim, MD  metoprolol tartrate (LOPRESSOR) 50 MG tablet Take 1 tablet (50 mg total) by mouth 2 (two) times daily. 07/05/23    Cathren Laine, MD  nicotine polacrilex (NICORETTE) 2 MG gum Chew 1 each (2 mg total) by mouth as needed for smoking cessation. 07/25/22   Kathlen Mody, MD  pantoprazole (PROTONIX) 40 MG tablet Take 1 tablet (40 mg total) by mouth daily. 05/20/23 06/19/23  Pollyann Savoy, MD  QUEtiapine (SEROQUEL) 50 MG tablet Take 1 tablet (50 mg total) by mouth at bedtime. 07/25/22 10/23/22  Kathlen Mody, MD  sucralfate (CARAFATE) 1 GM/10ML suspension Take 10 mLs (1 g total) by mouth 4 (four) times daily -  with meals and at bedtime. 05/20/23   Pollyann Savoy, MD      Allergies    Dilaudid [hydromorphone hcl]    Review of Systems   Review of Systems  Physical Exam Updated Vital Signs BP (!) 142/112   Pulse 64   Temp 98.4 F (36.9 C)   Resp 20   Wt 54.4 kg   SpO2 100%   BMI 20.60 kg/m  Physical Exam Vitals and nursing note reviewed.  Constitutional:      Appearance: She is well-developed.  HENT:     Head: Normocephalic and atraumatic.     Mouth/Throat:     Mouth: Mucous membranes are moist.     Comments: Swelling noted floor of mouth on the right side with associated tenderness.  No trismus. Eyes:  Conjunctiva/sclera: Conjunctivae normal.  Neck:     Comments: Patient winces and withdraws when I press over the anterior neck and underneath the chin.  There is mild swelling.  No overlying erythema.  Pain with hyperextension of the neck, no trismus.  Otherwise full range of motion of neck. Pulmonary:     Effort: No respiratory distress.  Musculoskeletal:     Cervical back: Normal range of motion and neck supple.  Skin:    General: Skin is warm and dry.  Neurological:     Mental Status: She is alert.     ED Results / Procedures / Treatments   Labs (all labs ordered are listed, but only abnormal results are displayed) Labs Reviewed  CBC WITH DIFFERENTIAL/PLATELET - Abnormal; Notable for the following components:      Result Value   WBC 11.3 (*)    RBC 3.85 (*)    MCV 102.9 (*)     MCH 36.4 (*)    Neutro Abs 8.6 (*)    All other components within normal limits  BASIC METABOLIC PANEL - Abnormal; Notable for the following components:   Sodium 134 (*)    All other components within normal limits    EKG None  Radiology No results found.  Procedures Procedures    Medications Ordered in ED Medications  ketorolac (TORADOL) 15 MG/ML injection 15 mg (has no administration in time range)    ED Course/ Medical Decision Making/ A&P    Patient seen and examined. History obtained directly from patient.   Labs/EKG: Ordered CBC and BMP.  Imaging: Ordered CT soft tissue neck with contrast.  Medications/Fluids: Ordered: IV Toradol.   Most recent vital signs reviewed and are as follows: BP (!) 142/112   Pulse 64   Temp 98.4 F (36.9 C)   Resp 20   Wt 54.4 kg   SpO2 100%   BMI 20.60 kg/m   Initial impression: Patient has swelling and exam consistent with sialadenitis, however her reported neck pain and tenderness and location of neck pain would be atypical for that.  For this reason we will obtain imaging to rule out other potential etiologies.  Currently no respiratory distress.  6:58 PM  Labs personally reviewed and interpreted including: CBC with mildly elevated white blood cell count at 11.3, normal hemoglobin; BMP unremarkable.  Imaging personally visualized and interpreted including: Some stranding and edema noted right anterior neck and submandibular area, pending results.  Reviewed pertinent lab work and imaging with patient at bedside. Questions answered.   Most current vital signs reviewed and are as follows: BP (!) 142/112   Pulse 64   Temp 98.4 F (36.9 C)   Resp 20   Wt 54.4 kg   SpO2 100%   BMI 20.60 kg/m   Plan: Follow-up on CT results, if infectious concern will need antibiotic treatment, reassessment.  Signout to [employer] at shift change.                                   Medical Decision Making Amount and/or  Complexity of Data Reviewed Labs: ordered. Radiology: ordered.  Risk Prescription drug management.   In regards to the patient's sore throat today, the following dangerous and potentially life threatening etiologies were considered on the differential diagnosis: Lugwig's angina, uvulitis, epiglottis, peritonsillar abscess, retropharyngeal abscess, Lemierre's syndrome. Also considered were more common causes such as: streptococcal pharyngitis, gonococcal pharyngitis, non-bacterial pharyngitis (cold viruses,  HSV/coxsackievirus, influenza, COVID-19, infectious mononucleosis, oropharyngeal candidiasis), and other non-infectious causes including seasonal allergies/post-nasal drip, GERD/esophagitis, trauma.           Final Clinical Impression(s) / ED Diagnoses Final diagnoses:  None    Rx / DC Orders ED Discharge Orders     None         Renne Crigler, PA-C 08/08/23 0941    Royanne Foots, DO 08/12/23 [phone]

## 2023-08-07 NOTE — ED Notes (Signed)
Received reports from [place], Charity fundraiser. Assumed care at this time.

## 2023-08-07 NOTE — Discharge Instructions (Addendum)
As discussed, you have an infection of your salivary duct.  You can try sucking on sour candy at home to help with the pain.  You can alternate between ibuprofen and Tylenol every 4 hours as needed for pain and swelling.  Take Augmentin (an antibiotic) twice a day for the next 10 days.  Complete full course of medication to ensure resolution of infection.  Please call the dentist to make an appointment of the resource guide provided for reevaluation.  Return to the ED if your symptoms worsen in the interim.

## 2023-08-07 NOTE — ED Triage Notes (Signed)
Woke up this am and felt like her mouth was hurting like maybe she bit her tongue, on the right side. Later she felt a know under her tongue, then progressed to her right side of throat hurting and swelling, getting hard to swallow liquids and saliva due pain

## 2023-08-07 NOTE — ED Provider Notes (Signed)
  Physical Exam  BP (!) 142/112   Pulse 64   Temp 98.4 F (36.9 C)   Resp 20   Wt 54.4 kg   SpO2 100%   BMI 20.60 kg/m   Physical Exam  Procedures  Procedures  ED Course / MDM    Medical Decision Making Amount and/or Complexity of Data Reviewed Labs: ordered. Radiology: ordered.  Risk Prescription drug management.   Patient care taken over at shift change. Disposition pending imaging.  CT soft tissue showed right submandibular sialadenitis.  No stone or ductal dilation seen.  I discussed findings with patient.  She reports that her pain did not improve with the Toradol shot.  She states that she not drive.  Will give her IM Toradol prior to discharge as well as first dose antibiotic.  Patient is hemodynamically stable and safe for discharge home.  Information for local dentist provided.  Prescription for Augmentin sent to the pharmacy.  Return precautions given.   Maxwell Marion, PA-C 08/07/23 2227    Royanne Foots, DO 08/12/23 [phone]

## 2023-08-23 ENCOUNTER — Encounter (HOSPITAL_COMMUNITY): Payer: Self-pay

## 2023-08-23 ENCOUNTER — Inpatient Hospital Stay (HOSPITAL_COMMUNITY): Payer: Medicaid Other | Admitting: Certified Registered Nurse Anesthetist

## 2023-08-23 ENCOUNTER — Inpatient Hospital Stay (HOSPITAL_COMMUNITY)
Admission: EM | Admit: 2023-08-23 | Discharge: 2023-08-26 | DRG: 378 | Disposition: A | Discharge status: Home / Self Care | Payer: Medicaid Other | Attending: Internal Medicine | Admitting: Internal Medicine

## 2023-08-23 ENCOUNTER — Other Ambulatory Visit: Payer: Self-pay

## 2023-08-23 ENCOUNTER — Encounter (HOSPITAL_COMMUNITY)
Admission: EM | Disposition: A | Discharge status: Home / Self Care | Payer: Self-pay | Source: Home / Self Care | Attending: Internal Medicine

## 2023-08-23 ENCOUNTER — Emergency Department (HOSPITAL_COMMUNITY): Payer: Medicaid Other

## 2023-08-23 DIAGNOSIS — F17210 Nicotine dependence, cigarettes, uncomplicated: Secondary | ICD-10-CM | POA: Diagnosis present

## 2023-08-23 DIAGNOSIS — K250 Acute gastric ulcer with hemorrhage: Secondary | ICD-10-CM

## 2023-08-23 DIAGNOSIS — K920 Hematemesis: Secondary | ICD-10-CM

## 2023-08-23 DIAGNOSIS — R1013 Epigastric pain: Secondary | ICD-10-CM

## 2023-08-23 DIAGNOSIS — D62 Acute posthemorrhagic anemia: Secondary | ICD-10-CM | POA: Diagnosis not present

## 2023-08-23 DIAGNOSIS — Z79899 Other long term (current) drug therapy: Secondary | ICD-10-CM

## 2023-08-23 DIAGNOSIS — R519 Headache, unspecified: Secondary | ICD-10-CM | POA: Diagnosis present

## 2023-08-23 DIAGNOSIS — F1010 Alcohol abuse, uncomplicated: Secondary | ICD-10-CM | POA: Diagnosis present

## 2023-08-23 DIAGNOSIS — K922 Gastrointestinal hemorrhage, unspecified: Principal | ICD-10-CM | POA: Diagnosis present

## 2023-08-23 DIAGNOSIS — G4700 Insomnia, unspecified: Secondary | ICD-10-CM | POA: Diagnosis present

## 2023-08-23 DIAGNOSIS — K254 Chronic or unspecified gastric ulcer with hemorrhage: Principal | ICD-10-CM | POA: Diagnosis present

## 2023-08-23 DIAGNOSIS — R55 Syncope and collapse: Secondary | ICD-10-CM

## 2023-08-23 DIAGNOSIS — D7589 Other specified diseases of blood and blood-forming organs: Secondary | ICD-10-CM | POA: Diagnosis present

## 2023-08-23 DIAGNOSIS — I10 Essential (primary) hypertension: Secondary | ICD-10-CM | POA: Diagnosis present

## 2023-08-23 DIAGNOSIS — Z885 Allergy status to narcotic agent status: Secondary | ICD-10-CM

## 2023-08-23 DIAGNOSIS — K2100 Gastro-esophageal reflux disease with esophagitis, without bleeding: Secondary | ICD-10-CM | POA: Diagnosis present

## 2023-08-23 DIAGNOSIS — R933 Abnormal findings on diagnostic imaging of other parts of digestive tract: Secondary | ICD-10-CM | POA: Diagnosis not present

## 2023-08-23 DIAGNOSIS — E871 Hypo-osmolality and hyponatremia: Secondary | ICD-10-CM | POA: Diagnosis not present

## 2023-08-23 DIAGNOSIS — E8809 Other disorders of plasma-protein metabolism, not elsewhere classified: Secondary | ICD-10-CM | POA: Diagnosis present

## 2023-08-23 HISTORY — PX: BIOPSY: SHX5522

## 2023-08-23 HISTORY — PX: ESOPHAGOGASTRODUODENOSCOPY (EGD) WITH PROPOFOL: SHX5813

## 2023-08-23 HISTORY — PX: HOT HEMOSTASIS: SHX5433

## 2023-08-23 HISTORY — PX: SCLEROTHERAPY: SHX6841

## 2023-08-23 LAB — CBC
HCT: 34.1 % — ABNORMAL LOW (ref 36.0–46.0)
HCT: 33.9 % — ABNORMAL LOW (ref 36.0–46.0)
HCT: 33.8 % — ABNORMAL LOW (ref 36.0–46.0)
HCT: 31.8 % — ABNORMAL LOW (ref 36.0–46.0)
Hemoglobin: 11.7 g/dL — ABNORMAL LOW (ref 12.0–15.0)
Hemoglobin: 11.7 g/dL — ABNORMAL LOW (ref 12.0–15.0)
Hemoglobin: 11.6 g/dL — ABNORMAL LOW (ref 12.0–15.0)
Hemoglobin: 10.9 g/dL — ABNORMAL LOW (ref 12.0–15.0)
MCH: 35.3 pg — ABNORMAL HIGH (ref 26.0–34.0)
MCH: 35.9 pg — ABNORMAL HIGH (ref 26.0–34.0)
MCH: 35.3 pg — ABNORMAL HIGH (ref 26.0–34.0)
MCH: 35.3 pg — ABNORMAL HIGH (ref 26.0–34.0)
MCHC: 34.3 g/dL (ref 30.0–36.0)
MCHC: 34.5 g/dL (ref 30.0–36.0)
MCHC: 34.3 g/dL (ref 30.0–36.0)
MCHC: 34.3 g/dL (ref 30.0–36.0)
MCV: 103 fL — ABNORMAL HIGH (ref 80.0–100.0)
MCV: 104 fL — ABNORMAL HIGH (ref 80.0–100.0)
MCV: 102.7 fL — ABNORMAL HIGH (ref 80.0–100.0)
MCV: 102.9 fL — ABNORMAL HIGH (ref 80.0–100.0)
Platelets: 189 10*3/uL (ref 150–400)
Platelets: 198 10*3/uL (ref 150–400)
Platelets: 206 10*3/uL (ref 150–400)
Platelets: 190 10*3/uL (ref 150–400)
RBC: 3.31 MIL/uL — ABNORMAL LOW (ref 3.87–5.11)
RBC: 3.26 MIL/uL — ABNORMAL LOW (ref 3.87–5.11)
RBC: 3.29 MIL/uL — ABNORMAL LOW (ref 3.87–5.11)
RBC: 3.09 MIL/uL — ABNORMAL LOW (ref 3.87–5.11)
RDW: 13.2 % (ref 11.5–15.5)
RDW: 13.2 % (ref 11.5–15.5)
RDW: 13.3 % (ref 11.5–15.5)
RDW: 13.2 % (ref 11.5–15.5)
WBC: 10.3 10*3/uL (ref 4.0–10.5)
WBC: 7.7 10*3/uL (ref 4.0–10.5)
WBC: 8.6 10*3/uL (ref 4.0–10.5)
WBC: 6.9 10*3/uL (ref 4.0–10.5)
nRBC: 0 % (ref 0.0–0.2)
nRBC: 0 % (ref 0.0–0.2)
nRBC: 0 % (ref 0.0–0.2)
nRBC: 0 % (ref 0.0–0.2)

## 2023-08-23 LAB — TYPE AND SCREEN
ABO/RH(D): O POS
Antibody Screen: NEGATIVE

## 2023-08-23 LAB — URINALYSIS, ROUTINE W REFLEX MICROSCOPIC
Bilirubin Urine: NEGATIVE
Glucose, UA: NEGATIVE mg/dL
Hgb urine dipstick: NEGATIVE
Ketones, ur: 5 mg/dL — AB
Nitrite: NEGATIVE
Protein, ur: NEGATIVE mg/dL
Specific Gravity, Urine: 1.02 (ref 1.005–1.030)
pH: 7 (ref 5.0–8.0)

## 2023-08-23 LAB — ABO/RH: ABO/RH(D): O POS

## 2023-08-23 LAB — BASIC METABOLIC PANEL
Anion gap: 11 (ref 5–15)
BUN: 17 mg/dL (ref 6–20)
CO2: 24 mmol/L (ref 22–32)
Calcium: 8.7 mg/dL — ABNORMAL LOW (ref 8.9–10.3)
Chloride: 100 mmol/L (ref 98–111)
Creatinine, Ser: 0.55 mg/dL (ref 0.44–1.00)
GFR, Estimated: >60 mL/min (ref >60)
Glucose, Bld: 138 mg/dL — ABNORMAL HIGH (ref 70–99)
Potassium: 3.6 mmol/L (ref 3.5–5.1)
Sodium: 135 mmol/L (ref 135–145)

## 2023-08-23 LAB — LIPASE, BLOOD
Lipase: 22 U/L (ref 11–51)
Lipase: 19 U/L (ref 11–51)

## 2023-08-23 LAB — HEPATIC FUNCTION PANEL
ALT: 18 U/L (ref 0–44)
AST: 23 U/L (ref 15–41)
Albumin: 3.2 g/dL — ABNORMAL LOW (ref 3.5–5.0)
Alkaline Phosphatase: 42 U/L (ref 38–126)
Bilirubin, Direct: 0.1 mg/dL (ref 0.0–0.2)
Indirect Bilirubin: 0.6 mg/dL (ref 0.3–0.9)
Total Bilirubin: 0.7 mg/dL (ref 0.3–1.2)
Total Protein: 5.9 g/dL — ABNORMAL LOW (ref 6.5–8.1)

## 2023-08-23 LAB — HCG, SERUM, QUALITATIVE: Preg, Serum: NEGATIVE

## 2023-08-23 LAB — POC OCCULT BLOOD, ED: Fecal Occult Bld: POSITIVE — AB

## 2023-08-23 LAB — CBG MONITORING, ED: Glucose-Capillary: 160 mg/dL — ABNORMAL HIGH (ref 70–99)

## 2023-08-23 SURGERY — ESOPHAGOGASTRODUODENOSCOPY (EGD) WITH PROPOFOL
Anesthesia: Monitor Anesthesia Care

## 2023-08-23 MED ORDER — SENNOSIDES-DOCUSATE SODIUM 8.6-50 MG PO TABS: 1.0000 | ORAL_TABLET | Freq: Every evening | ORAL | Status: DC | PRN

## 2023-08-23 MED ORDER — AMISULPRIDE (ANTIEMETIC) 5 MG/2ML IV SOLN
10.0000 mg | Freq: Once | INTRAVENOUS | Status: AC
  Administered 2023-08-23: 10 mg via INTRAVENOUS

## 2023-08-23 MED ORDER — SODIUM CHLORIDE 0.9 % IV SOLN
50.0000 ug/h | INTRAVENOUS | Status: DC
  Filled 2023-08-23: qty 1

## 2023-08-23 MED ORDER — AMISULPRIDE (ANTIEMETIC) 5 MG/2ML IV SOLN
INTRAVENOUS | Status: AC
  Filled 2023-08-23: qty 4

## 2023-08-23 MED ORDER — ONDANSETRON HCL 4 MG/2ML IJ SOLN
4.0000 mg | Freq: Four times a day (QID) | INTRAMUSCULAR | Status: DC | PRN
  Administered 2023-08-25: 4 mg via INTRAVENOUS
  Filled 2023-08-23: qty 2

## 2023-08-23 MED ORDER — SODIUM CHLORIDE 0.9 % IV SOLN: INTRAVENOUS | Status: DC | PRN

## 2023-08-23 MED ORDER — MORPHINE SULFATE (PF) 2 MG/ML IV SOLN
INTRAVENOUS | Status: AC
  Filled 2023-08-23: qty 1

## 2023-08-23 MED ORDER — MORPHINE SULFATE (PF) 4 MG/ML IV SOLN: 2.0000 mg | Freq: Once | INTRAVENOUS | Status: DC

## 2023-08-23 MED ORDER — QUETIAPINE FUMARATE 25 MG PO TABS: 50.0000 mg | ORAL_TABLET | Freq: Every day | ORAL | Status: DC

## 2023-08-23 MED ORDER — FENTANYL CITRATE (PF) 100 MCG/2ML IJ SOLN
INTRAMUSCULAR | Status: AC
  Filled 2023-08-23: qty 2

## 2023-08-23 MED ORDER — PROPOFOL 500 MG/50ML IV EMUL
INTRAVENOUS | Status: DC | PRN
  Administered 2023-08-23: 100 ug/kg/min via INTRAVENOUS

## 2023-08-23 MED ORDER — ACETAMINOPHEN 325 MG PO TABS
650.0000 mg | ORAL_TABLET | Freq: Four times a day (QID) | ORAL | Status: DC | PRN
  Administered 2023-08-25: 650 mg via ORAL
  Filled 2023-08-23: qty 2

## 2023-08-23 MED ORDER — SODIUM CHLORIDE 0.9 % IV SOLN: 1.0000 g | Freq: Once | INTRAVENOUS | Status: DC

## 2023-08-23 MED ORDER — ONDANSETRON HCL 4 MG PO TABS: 4.0000 mg | ORAL_TABLET | Freq: Four times a day (QID) | ORAL | Status: DC | PRN

## 2023-08-23 MED ORDER — PANTOPRAZOLE SODIUM 40 MG IV SOLR: 40.0000 mg | Freq: Two times a day (BID) | INTRAVENOUS | Status: DC

## 2023-08-23 MED ORDER — ONDANSETRON HCL 4 MG/2ML IJ SOLN
4.0000 mg | Freq: Once | INTRAMUSCULAR | Status: AC
  Administered 2023-08-23: 4 mg via INTRAVENOUS
  Filled 2023-08-23: qty 2

## 2023-08-23 MED ORDER — FOLIC ACID 1 MG PO TABS
1.0000 mg | ORAL_TABLET | Freq: Every day | ORAL | Status: DC
  Administered 2023-08-23 – 2023-08-26 (×4): 1 mg via ORAL
  Filled 2023-08-23 (×4): qty 1

## 2023-08-23 MED ORDER — ONDANSETRON HCL 4 MG/2ML IJ SOLN
INTRAMUSCULAR | Status: DC | PRN
  Administered 2023-08-23: 4 mg via INTRAVENOUS

## 2023-08-23 MED ORDER — THIAMINE HCL 100 MG/ML IJ SOLN: 100.0000 mg | Freq: Every day | INTRAMUSCULAR | Status: DC

## 2023-08-23 MED ORDER — MORPHINE SULFATE (PF) 2 MG/ML IV SOLN
2.0000 mg | Freq: Once | INTRAVENOUS | Status: AC
  Administered 2023-08-23: 2 mg via INTRAVENOUS

## 2023-08-23 MED ORDER — TRAZODONE HCL 50 MG PO TABS
50.0000 mg | ORAL_TABLET | Freq: Every evening | ORAL | Status: DC | PRN
  Administered 2023-08-25: 50 mg via ORAL
  Filled 2023-08-23: qty 1

## 2023-08-23 MED ORDER — PANTOPRAZOLE SODIUM 40 MG IV SOLR
40.0000 mg | Freq: Four times a day (QID) | INTRAVENOUS | Status: DC
  Administered 2023-08-23 – 2023-08-26 (×12): 40 mg via INTRAVENOUS
  Filled 2023-08-23 (×12): qty 10

## 2023-08-23 MED ORDER — PANTOPRAZOLE SODIUM 40 MG IV SOLR
40.0000 mg | INTRAVENOUS | Status: DC
  Administered 2023-08-23: 40 mg via INTRAVENOUS
  Filled 2023-08-23 (×2): qty 10

## 2023-08-23 MED ORDER — LORAZEPAM 2 MG/ML IJ SOLN: 1.0000 mg | INTRAMUSCULAR | Status: AC | PRN

## 2023-08-23 MED ORDER — PROCHLORPERAZINE EDISYLATE 10 MG/2ML IJ SOLN
10.0000 mg | Freq: Once | INTRAMUSCULAR | Status: AC
  Administered 2023-08-23: 10 mg via INTRAVENOUS
  Filled 2023-08-23: qty 2

## 2023-08-23 MED ORDER — PROPOFOL 10 MG/ML IV BOLUS
INTRAVENOUS | Status: DC | PRN
  Administered 2023-08-23: 20 mg via INTRAVENOUS
  Administered 2023-08-23: 40 mg via INTRAVENOUS
  Administered 2023-08-23: 30 mg via INTRAVENOUS
  Administered 2023-08-23 (×2): 20 mg via INTRAVENOUS
  Administered 2023-08-23: 30 mg via INTRAVENOUS

## 2023-08-23 MED ORDER — ACETAMINOPHEN 650 MG RE SUPP: 650.0000 mg | Freq: Four times a day (QID) | RECTAL | Status: DC | PRN

## 2023-08-23 MED ORDER — PANTOPRAZOLE SODIUM 40 MG IV SOLR
40.0000 mg | Freq: Once | INTRAVENOUS | Status: AC
  Administered 2023-08-23: 40 mg via INTRAVENOUS

## 2023-08-23 MED ORDER — HYDRALAZINE HCL 20 MG/ML IJ SOLN: 10.0000 mg | INTRAMUSCULAR | Status: DC | PRN

## 2023-08-23 MED ORDER — IPRATROPIUM-ALBUTEROL 0.5-2.5 (3) MG/3ML IN SOLN: 3.0000 mL | RESPIRATORY_TRACT | Status: DC | PRN

## 2023-08-23 MED ORDER — ADULT MULTIVITAMIN W/MINERALS CH
1.0000 | ORAL_TABLET | Freq: Every day | ORAL | Status: DC
  Administered 2023-08-23 – 2023-08-26 (×4): 1 via ORAL
  Filled 2023-08-23 (×4): qty 1

## 2023-08-23 MED ORDER — IOHEXOL 350 MG/ML SOLN
75.0000 mL | Freq: Once | INTRAVENOUS | Status: AC | PRN
  Administered 2023-08-23: 75 mL via INTRAVENOUS

## 2023-08-23 MED ORDER — EPINEPHRINE 1 MG/10ML IJ SOSY
PREFILLED_SYRINGE | INTRAMUSCULAR | Status: DC | PRN
  Administered 2023-08-23: .15 mg via SUBCUTANEOUS

## 2023-08-23 MED ORDER — OCTREOTIDE LOAD VIA INFUSION
50.0000 ug | Freq: Once | INTRAVENOUS | Status: DC
  Filled 2023-08-23: qty 25

## 2023-08-23 MED ORDER — METOPROLOL TARTRATE 5 MG/5ML IV SOLN: 5.0000 mg | INTRAVENOUS | Status: DC | PRN

## 2023-08-23 MED ORDER — OXYCODONE HCL 5 MG PO TABS
5.0000 mg | ORAL_TABLET | ORAL | Status: DC | PRN
  Administered 2023-08-23 – 2023-08-26 (×13): 5 mg via ORAL
  Filled 2023-08-23 (×14): qty 1

## 2023-08-23 MED ORDER — LORAZEPAM 1 MG PO TABS: 1.0000 mg | ORAL_TABLET | ORAL | Status: AC | PRN

## 2023-08-23 MED ORDER — THIAMINE MONONITRATE 100 MG PO TABS
100.0000 mg | ORAL_TABLET | Freq: Every day | ORAL | Status: DC
  Administered 2023-08-23 – 2023-08-26 (×4): 100 mg via ORAL
  Filled 2023-08-23 (×7): qty 1

## 2023-08-23 MED ORDER — HYDROMORPHONE HCL 1 MG/ML IJ SOLN
0.5000 mg | INTRAMUSCULAR | Status: DC | PRN
  Administered 2023-08-23: 0.5 mg via INTRAVENOUS
  Filled 2023-08-23: qty 0.5

## 2023-08-23 SURGICAL SUPPLY — 15 items

## 2023-08-23 NOTE — Transfer of Care (Signed)
Immediate Anesthesia Transfer of Care Note  Patient: Melanie Moreno  Procedure(s) Performed: ESOPHAGOGASTRODUODENOSCOPY (EGD) WITH PROPOFOL HOT HEMOSTASIS (ARGON PLASMA COAGULATION/BICAP) SCLEROTHERAPY BIOPSY  Patient Location: Endoscopy Unit  Anesthesia Type:MAC  Level of Consciousness: awake, alert , and oriented  Airway & Oxygen Therapy: Patient Spontanous Breathing  Post-op Assessment: Report given to RN and Post -op Vital signs reviewed and stable  Post vital signs: Reviewed and stable  Last Vitals:  Vitals Value Taken Time  BP    Temp    Pulse 91 08/23/23 1039  Resp 12 08/23/23 1041  SpO2    Vitals shown include unfiled device data.  Last Pain:  Vitals:   08/23/23 1039  TempSrc:   PainSc: 0-No pain         Complications: No notable events documented.

## 2023-08-23 NOTE — ED Triage Notes (Signed)
Patient BIB [facility] from home due to syncopal episode. EMS also reports blood stool and bloody emesis. Patient states both of these started today upon waking up and prior to EMS arrival. Patient states stool is burgandy in color. EMS reports chronic ETOH. Patient states no hx of syncopal episodes. EMS gave 4mg  zofran IV en route. Patient is A&Ox4.

## 2023-08-23 NOTE — H&P (Signed)
History and Physical    Melanie Moreno QIH:[id number] DOB: [birth year] DOA: 08/23/2023  PCP: Pcp, No   Chief Complaint: gib  HPI: Melanie Moreno is a 47 y.o. female with medical history significant of hypertension, alcohol abuse, tobacco usage who presents emergency department due to 3 days of abdominal pain and 1 large episode of hematemesis.  Patient had reportedly syncopized after arrival.  She is a heavy alcohol user and last drink was 2 days ago.  She presents emergency department where she was found to be afebrile hemodynamically stable.  Labs were Tane on presentation which revealed hemoglobin 11.7 baseline around 14, MCV 103, lipase 22, LFTs within normal limits.  CT abdomen pelvis was obtained which showed no acute findings and gastric wall thickening.  Patient was admitted for further workup.  On evaluation she does endorse seeing maroon-colored vomit and maroon-colored stools.  She currently takes [place] powder.  She reports having endoscopies in the past for ulcers which was several years ago.  She is currently not taking any acid suppressing medications   Review of Systems: Review of Systems  Constitutional: Negative.   HENT: Negative.    Eyes: Negative.   Respiratory: Negative.    Cardiovascular: Negative.   Gastrointestinal:  Positive for blood in stool, heartburn, melena, nausea and vomiting.  Genitourinary: Negative.   Musculoskeletal: Negative.   Skin: Negative.   Neurological: Negative.   Endo/Heme/Allergies: Negative.   Psychiatric/Behavioral: Negative.    All other systems reviewed and are negative.    As per HPI otherwise 10 point review of systems negative.   Allergies  Allergen Reactions   Dilaudid [Hydromorphone Hcl] Other (See Comments)    headaches    Past Medical History:  Diagnosis Date   Anemia    Hypertension    Irregular heart rate    Ovarian cyst     Past Surgical History:  Procedure Laterality Date   APPENDECTOMY     BREAST ENHANCEMENT  SURGERY       reports that she has been smoking cigarettes. She has never used smokeless tobacco. She reports that she does not currently use alcohol. She reports that she does not use drugs.  History reviewed. No pertinent family history.  Prior to Admission medications   Medication Sig Start Date End Date Taking? Authorizing Provider  amLODipine (NORVASC) 5 MG tablet Take 1 tablet (5 mg total) by mouth daily. 07/25/22   Kathlen Mody, MD  calcium carbonate (TUMS EX) 750 MG chewable tablet Chew 1 tablet (750 mg total) by mouth daily as needed for heartburn. 07/25/22   Kathlen Mody, MD  carisoprodol (SOMA) 250 MG tablet Take 1 tablet (250 mg total) by mouth 4 (four) times daily as needed (muscle spasms). 12/20/22   Molpus, Jonny Ruiz, MD  hydrALAZINE (APRESOLINE) 10 MG tablet Take 1 tablet (10 mg total) by mouth 3 (three) times daily as needed (If blood pressure over 180 and not responding to other medications.). 04/21/23   Tegeler, Canary Brim, MD  metoprolol tartrate (LOPRESSOR) 50 MG tablet Take 1 tablet (50 mg total) by mouth 2 (two) times daily. 08/07/23 09/06/23  Maxwell Marion, PA-C  nicotine polacrilex (NICORETTE) 2 MG gum Chew 1 each (2 mg total) by mouth as needed for smoking cessation. 07/25/22   Kathlen Mody, MD  pantoprazole (PROTONIX) 40 MG tablet Take 1 tablet (40 mg total) by mouth daily. 05/20/23 06/19/23  Pollyann Savoy, MD  QUEtiapine (SEROQUEL) 50 MG tablet Take 1 tablet (50 mg total) by mouth at bedtime. 07/25/22 10/23/22  Kathlen Mody, MD  sucralfate (CARAFATE) 1 GM/10ML suspension Take 10 mLs (1 g total) by mouth 4 (four) times daily -  with meals and at bedtime. 05/20/23   Pollyann Savoy, MD    Physical Exam: Vitals:   08/23/23 0127 08/23/23 0145 08/23/23 0200 08/23/23 0439  BP:  117/85 105/81 114/87  Pulse:  65 82 85  Resp:  15 15 12   Temp:      TempSrc:      SpO2:  97% 98% 100%  Weight: 54.4 kg     Height: 5\' 4"  (1.626 m)      Physical Exam Vitals reviewed.   Constitutional:      Appearance: She is normal weight.  HENT:     Head: Normocephalic.     Nose: Nose normal.     Mouth/Throat:     Mouth: Mucous membranes are moist.     Pharynx: Oropharynx is clear.  Eyes:     Conjunctiva/sclera: Conjunctivae normal.     Pupils: Pupils are equal, round, and reactive to light.  Cardiovascular:     Rate and Rhythm: Normal rate and regular rhythm.     Pulses: Normal pulses.  Pulmonary:     Effort: Pulmonary effort is normal.     Breath sounds: Normal breath sounds.  Abdominal:     General: Abdomen is flat. Bowel sounds are normal.  Musculoskeletal:        General: Normal range of motion.     Cervical back: Normal range of motion.  Skin:    General: Skin is warm.     Capillary Refill: Capillary refill takes less than 2 seconds.  Neurological:     General: No focal deficit present.     Mental Status: She is alert.  Psychiatric:        Mood and Affect: Mood normal.        Labs on Admission: I have personally reviewed the patients's labs and imaging studies.  Assessment/Plan Principal Problem:   GI bleed   # Acute GI bleed most likely upper, POA, active - Patient reports history of [place] powder and prior ulcer - Nonacid medications - History of smoking - History of alcohol usage - Differential diagnosis includes peptic ulcer disease, esophagitis, gastritis.  Varices less likely given lack of known cirrhosis and thrombocytopenia - 2 to 3 g hemoglobin drop  Plan: Twice daily PPI IV 2 large-bore IVs H. pylori stool antigen testing GI consulted and n.p.o. in morning for procedure  # Alcohol abuse-encourage cessation  # Insomnia-continue Seroquel  # Hypertension-hold metoprolol  Admission status: Inpatient Telemetry Medical  Certification: The appropriate patient status for this patient is INPATIENT. Inpatient status is judged to be reasonable and necessary in order to provide the required intensity of service to ensure the  patient's safety. The patient's presenting symptoms, physical exam findings, and initial radiographic and laboratory data in the context of their chronic comorbidities is felt to place them at high risk for further clinical deterioration. Furthermore, it is not anticipated that the patient will be medically stable for discharge from the hospital within 2 midnights of admission.   * I certify that at the point of admission it is my clinical judgment that the patient will require inpatient hospital care spanning beyond 2 midnights from the point of admission due to high intensity of service, high risk for further deterioration and high frequency of surveillance required.Alan Mulder MD [facility] If 7PM-7AM, please contact night-coverage [email or web address]  08/23/2023,  #-  Encourage cessation5:39 AM

## 2023-08-23 NOTE — Anesthesia Preprocedure Evaluation (Addendum)
Anesthesia Evaluation  Patient identified by MRN, date of birth, ID band Patient awake    Reviewed: Allergy & Precautions, NPO status , Patient's Chart, lab work & pertinent test results, reviewed documented beta blocker date and time   Airway Mallampati: II  TM Distance: >3 FB Neck ROM: Full    Dental  (+) Teeth Intact, Dental Advisory Given, Implants,    Pulmonary Current Smoker   Pulmonary exam normal breath sounds clear to auscultation       Cardiovascular hypertension, Pt. on home beta blockers Normal cardiovascular exam Rhythm:Regular Rate:Normal     Neuro/Psych  Headaches  negative psych ROS   GI/Hepatic negative GI ROS, Neg liver ROS,,,GI bleed    Endo/Other  negative endocrine ROS    Renal/GU negative Renal ROS     Musculoskeletal negative musculoskeletal ROS (+)    Abdominal   Peds  Hematology negative hematology ROS (+)   Anesthesia Other Findings   Reproductive/Obstetrics                             Anesthesia Physical Anesthesia Plan  ASA: 2  Anesthesia Plan: MAC   Post-op Pain Management: Minimal or no pain anticipated   Induction: Intravenous  PONV Risk Score and Plan: 1 and TIVA  Airway Management Planned: Natural Airway and Simple Face Mask  Additional Equipment:   Intra-op Plan:   Post-operative Plan:   Informed Consent: I have reviewed the patients History and Physical, chart, labs and discussed the procedure including the risks, benefits and alternatives for the proposed anesthesia with the patient or authorized representative who has indicated his/her understanding and acceptance.     Dental advisory given  Plan Discussed with: CRNA  Anesthesia Plan Comments:        Anesthesia Quick Evaluation

## 2023-08-23 NOTE — Op Note (Signed)
[facility] Patient Name: Melanie Moreno Procedure Date : 08/23/2023 MRN: [id number] Attending MD: Beverley Fiedler , MD, [id number] Date of Birth: [birth year] CSN: [id number] Age: 47 Admit Type: Inpatient Procedure:                Upper GI endoscopy Indications:              Epigastric abdominal pain, Hematemesis, Abnormal CT                            of the GI tract Providers:                Carie Caddy. Rhea Belton, MD, Stephens Shire RN, RN, Rozetta Nunnery, Technician Referring MD:             [facility] Medicines:                Monitored Anesthesia Care Complications:            No immediate complications. Estimated Blood Loss:     Estimated blood loss: none. Procedure:                Pre-Anesthesia Assessment:                           - Prior to the procedure, a History and Physical                            was performed, and patient medications and                            allergies were reviewed. The patient's tolerance of                            previous anesthesia was also reviewed. The risks                            and benefits of the procedure and the sedation                            options and risks were discussed with the patient.                            All questions were answered, and informed consent                            was obtained. Prior Anticoagulants: The patient has                            taken no anticoagulant or antiplatelet agents. ASA                            Grade Assessment: II - A patient with mild systemic  disease. After reviewing the risks and benefits,                            the patient was deemed in satisfactory condition to                            undergo the procedure.                           After obtaining informed consent, the endoscope was                            passed under direct vision. Throughout the                             procedure, the patient's blood pressure, pulse, and                            oxygen saturations were monitored continuously. The                            GIF-H190 ([id number]) Olympus endoscope was introduced                            through the mouth, and advanced to the second part                            of duodenum. The upper GI endoscopy was                            accomplished without difficulty. The patient                            tolerated the procedure well. Scope In: Scope Out: Findings:      LA Grade A (one or more mucosal breaks less than 5 mm, not extending       between tops of 2 mucosal folds) esophagitis with no bleeding was found       at the gastroesophageal junction.      Three non-bleeding cratered gastric ulcers, one with a visible vessel,       were found in the gastric antrum. The largest lesion was 9 mm in largest       dimension. Area around the ulcer with visible vessel was successfully       injected with 1.5 mL total of a 0.1 mg/mL solution of epinephrine for       hemostasis. Coagulation for hemostasis using monopolar Gold probe was       successful. Biopsies were taken with a cold forceps for histology (Jar 1       = gastric ulcers; Jar 2 = gastric body and incisura).      The examined duodenum was normal. Impression:               - LA Grade A reflux esophagitis with no bleeding.                           -  Gastric ulcers, 1 with a visible vessel.                            Injected. Treated with a monopolar probe. Biopsied.                           - Normal examined duodenum. Moderate Sedation:      N/A Recommendation:           - Return patient to hospital ward for ongoing care.                           - Clear liquid diet. Advanced diet as tolerated.                           - Continue present medications. IV PPI infusion                            normally but with fluid shortage will use IV q6                            hours. Once  transitioned to PO PPI use BID until GI                            follow-up.                           - No aspirin, ibuprofen, naproxen, or other                            non-steroidal anti-inflammatory drugs.                           - Await pathology results.                           - Repeat upper endoscopy in 8-12 weeks to check                            healing. Procedure Code(s):        --- Professional ---                           43255, 59, Esophagogastroduodenoscopy, flexible,                            transoral; with control of bleeding, any method                           43239, Esophagogastroduodenoscopy, flexible,                            transoral; with biopsy, single or multiple Diagnosis Code(s):        --- Professional ---  K21.00, Gastro-esophageal reflux disease with                            esophagitis, without bleeding                           K25.4, Chronic or unspecified gastric ulcer with                            hemorrhage                           R10.13, Epigastric pain                           K92.0, Hematemesis                           R93.3, Abnormal findings on diagnostic imaging of                            other parts of digestive tract CPT copyright 2022 American Medical Association. All rights reserved. The codes documented in this report are preliminary and upon coder review may  be revised to meet current compliance requirements. Beverley Fiedler, MD 08/23/2023 10:42:10 AM This report has been signed electronically. Number of Addenda: 0

## 2023-08-23 NOTE — ED Notes (Signed)
Patient transported to CT 

## 2023-08-23 NOTE — Consult Note (Signed)
Consultation  Referring Provider: [facility]/Amin Primary Care Physician:  Pcp, No Primary Gastroenterologist: Gentry Fitz  Reason for Consultation: acute upper GI bleed  HPI: Melanie Moreno is a 47 y.o. female with history of hypertension. She has no prior history of any GI issues, no prior GI evaluation. Patient says that she has been feeling a little bit nauseated over the past 3 to 4 days and had decrease in her appetite.  Last night after she had gone to bed she felt more nauseated, got up to vomit and then realized that she had vomited obvious dark red blood after that she had a described large bloody bowel movement again with dark red blood and clots.  She called EMS.  She felt diaphoretic and dizzy and laid down on the bathroom floor but did not syncopize. She relates that she had 1 other episode of hematemesis in the ambulance and then since then has not had any further active bleeding. She is not on any blood thinners, does use occasional BCs and Advil.  She does use alcohol socially primarily on the weekends by her report and says she has cut down a lot recently. Workup after arrival in the ER with CT of the abdomen and pelvis with contrast did show circumferential low-density thickening of the distal stomach which had been present on imaging in 2021 as well.  No evidence of cirrhosis and otherwise unremarkable study. Globin had been done on 08/07/2023 and was 14/platelets 183  Labs on admit hemoglobin 0.7/hematocrit 34.1/MCV 103/platelets 189 Potassium 3.6/BUN 17/creatinine 0.55 Albumin 3.2/LFTs normal Lipase within normal limits, beta-hCG negative  Repeat hemoglobin at 6 AM 11.7 stable  Has been hemodynamically stable since arrival  Past Medical History:  Diagnosis Date   Anemia    Hypertension    Irregular heart rate    Ovarian cyst     Past Surgical History:  Procedure Laterality Date   APPENDECTOMY     BREAST ENHANCEMENT SURGERY      Prior to Admission medications    Medication Sig Start Date End Date Taking? Authorizing Provider  ibuprofen (ADVIL) 200 MG tablet Take 800 mg by mouth every 8 (eight) hours as needed for mild pain (pain score 1-3), cramping or headache.   Yes [provider]  metoprolol tartrate (LOPRESSOR) 50 MG tablet Take 1 tablet (50 mg total) by mouth 2 (two) times daily. 08/07/23 09/06/23 Yes Maxwell Marion, PA-C  calcium carbonate (TUMS EX) 750 MG chewable tablet Chew 1 tablet (750 mg total) by mouth daily as needed for heartburn. Patient taking differently: Chew 2 tablets by mouth daily as needed for heartburn. 07/25/22   Kathlen Mody, MD    Current Facility-Administered Medications  Medication Dose Route Frequency Provider Last Rate Last Admin   acetaminophen (TYLENOL) tablet 650 mg  650 mg Oral Q6H PRN Alan Mulder, MD       Or   acetaminophen (TYLENOL) suppository 650 mg  650 mg Rectal Q6H PRN Alan Mulder, MD       hydrALAZINE (APRESOLINE) injection 10 mg  10 mg Intravenous Q4H PRN Amin, Ankit C, MD       ipratropium-albuterol (DUONEB) 0.5-2.5 (3) MG/3ML nebulizer solution 3 mL  3 mL Nebulization Q4H PRN Amin, Ankit C, MD       metoprolol tartrate (LOPRESSOR) injection 5 mg  5 mg Intravenous Q4H PRN Amin, Ankit C, MD       ondansetron (ZOFRAN) tablet 4 mg  4 mg Oral Q6H PRN Alan Mulder, MD  Or   ondansetron (ZOFRAN) injection 4 mg  4 mg Intravenous Q6H PRN Dorrell, Robert, MD       pantoprazole (PROTONIX) injection 40 mg  40 mg Intravenous Q12H Dorrell, Molly Maduro, MD       QUEtiapine (SEROQUEL) tablet 50 mg  50 mg Oral QHS Dorrell, Robert, MD       senna-docusate (Senokot-S) tablet 1 tablet  1 tablet Oral QHS PRN Amin, Ankit C, MD       traZODone (DESYREL) tablet 50 mg  50 mg Oral QHS PRN Amin, Ankit C, MD        Allergies as of 08/23/2023 - Review Complete 08/23/2023  Allergen Reaction Noted   Dilaudid [hydromorphone hcl] Other (See Comments) 12/16/2013    History reviewed. No pertinent family  history.  Social History   Socioeconomic History   Marital status: Single    Spouse name: Not on file   Number of children: Not on file   Years of education: Not on file   Highest education level: Not on file  Occupational History   Not on file  Tobacco Use   Smoking status: Every Day    Current packs/day: 1.00    Types: Cigarettes   Smokeless tobacco: Never  Substance and Sexual Activity   Alcohol use: Not Currently    Comment: occassional   Drug use: Never   Sexual activity: Not on file  Other Topics Concern   Not on file  Social History Narrative   Not on file   Social Determinants of Health   Financial Resource Strain: Not on file  Food Insecurity: No Food Insecurity (07/21/2022)   Hunger Vital Sign    Worried About Running Out of Food in the Last Year: Never true    Ran Out of Food in the Last Year: Never true  Transportation Needs: No Transportation Needs (07/21/2022)   PRAPARE - Administrator, Civil Service (Medical): No    Lack of Transportation (Non-Medical): No  Physical Activity: Not on file  Stress: Not on file  Social Connections: Not on file  Intimate Partner Violence: Not At Risk (07/21/2022)   Humiliation, Afraid, Rape, and Kick questionnaire    Fear of Current or Ex-Partner: No    Emotionally Abused: No    Physically Abused: No    Sexually Abused: No    Review of Systems: Pertinent positive and negative review of systems were noted in the above HPI section.  All other review of systems was otherwise negative.  Physical Exam: Vital signs in last 24 hours: Temp:  [98 F (36.7 C)-98.9 F (37.2 C)] 98 F (36.7 C) (11/01 0740) Pulse Rate:  [59-85] 59 (11/01 0740) Resp:  [12-19] 18 (11/01 0740) BP: (105-133)/(81-98) 131/89 (11/01 0740) SpO2:  [96 %-100 %] 98 % (11/01 0740) Weight:  [54.4 kg] 54.4 kg (11/01 0127)   General:   Alert,  Well-developed, well-nourished,WF pleasant and cooperative in NAD Head:  Normocephalic and  atraumatic. Eyes:  Sclera clear, no icterus.   Conjunctiva pink. Ears:  Normal auditory acuity. Nose:  No deformity, discharge,  or lesions. Mouth:  No deformity or lesions.   Neck:  Supple; no masses or thyromegaly. Lungs:  Clear throughout to auscultation.   No wheezes, crackles, or rhonchi.  Heart:  Regular rate and rhythm; no murmurs, clicks, rubs,  or gallops. Abdomen:  Soft,tender in epigastrium,  BS active,nonpalp mass or hsm.   Rectal:  not done Msk:  Symmetrical without gross deformities. . Pulses:  Normal  pulses noted. Extremities:  Without clubbing or edema. Neurologic:  Alert and  oriented x4;  grossly normal neurologically. Skin:  Intact without significant lesions or rashes.. Psych:  Alert and cooperative. Normal mood and affect.  Intake/Output from previous day: No intake/output data recorded. Intake/Output this shift: No intake/output data recorded.  Lab Results: Recent Labs    08/23/23 0150 08/23/23 0555  WBC 10.3 7.7  HGB 11.7* 11.7*  HCT 34.1* 33.9*  PLT 189 198   BMET Recent Labs    08/23/23 0150  NA 135  K 3.6  CL 100  CO2 24  GLUCOSE 138*  BUN 17  CREATININE 0.55  CALCIUM 8.7*   LFT Recent Labs    08/23/23 0150  PROT 5.9*  ALBUMIN 3.2*  AST 23  ALT 18  ALKPHOS 42  BILITOT 0.7  BILIDIR 0.1  IBILI 0.6   PT/INR No results for input(s): "LABPROT", "INR" in the last 72 hours. Hepatitis Panel No results for input(s): "HEPBSAG", "HCVAB", "HEPAIGM", "HEPBIGM" in the last 72 hours.    IMPRESSION:  #69 47 year old white female with acute upper GI bleed presenting with onset of hematemesis of dark red blood which woke her from sleep last night followed by a dark bloody bowel movement She had been feeling nauseated and had decreased appetite over the past few days and says she has noticed some epigastric discomfort off and on for the past few months.  CT imaging concerning for circumferential low-density wall thickening of the distal  stomach which was also present on imaging in 2021  No evidence of cirrhosis on CT  Patient has been hemodynamically stable  Etiology of bleeding is not clear, with CT findings, suspect chronic peptic ulcer disease, cannot rule out gastric neoplasm/lesion History of BC powder/NSAID use  #2 mild anemia secondary to above #3 hypoalbuminemia and macrocytosis suggestive of active EtOH use #4 history of hypertension   PLAN: N.p.o. Continue IV PPI twice daily Will be scheduled for upper endoscopy with Dr. Rhea Belton today.  Procedure was discussed in detail with the patient including indications risk benefits and she is agreeable to proceed Needs to stop EtOH, and stop BC powders  GI Follow with you   Codi Folkerts EsterwoodPA-C  08/23/2023, 9:20 AM

## 2023-08-23 NOTE — TOC CAGE-AID Note (Signed)
Transition of Care [facility]) - CAGE-AID Screening   Patient Details  Name: Melanie Moreno MRN: [id number] Date of Birth: 1976/03/02  Transition of Care [facility]) CM/SW Contact:    Kingsley Plan, RN Phone Number: 08/23/2023, 1:43 PM   Clinical Narrative:    CAGE-AID Screening: Substance Abuse Screening unable to be completed due to: : Patient unable to participate (sedated from procedure)             Substance Abuse Education Offered: Yes

## 2023-08-23 NOTE — ED Notes (Signed)
ED TO INPATIENT HANDOFF REPORT  ED Nurse Name and Phone #: Marcello Moores [phone]  S Name/Age/Gender Melanie Moreno 47 y.o. female Room/Bed: 018C/018C  Code Status   Code Status: Full Code  Home/SNF/Other Home Patient oriented to: self, place, time, and situation Is this baseline? Yes   Triage Complete: Triage complete  Chief Complaint GI bleed [K92.2]  Triage Note Patient BIB [facility] from home due to syncopal episode. EMS also reports blood stool and bloody emesis. Patient states both of these started today upon waking up and prior to EMS arrival. Patient states stool is burgandy in color. EMS reports chronic ETOH. Patient states no hx of syncopal episodes. EMS gave 4mg  zofran IV en route. Patient is A&Ox4.   Allergies Allergies  Allergen Reactions   Dilaudid [Hydromorphone Hcl] Other (See Comments)    Severe headache    Level of Care/Admitting Diagnosis ED Disposition     ED Disposition  Admit   Condition  --   Comment  [facility] [[id number]]  Level of Care: Telemetry Medical [104]  May admit patient to [facility] or [facility] if equivalent level of care is available:: Yes  Covid Evaluation: Asymptomatic - no recent exposure (last 10 days) testing not required  Diagnosis: GI bleed [454098]  Admitting Physician: Alan Mulder [[id number]]  Attending Physician: Alan Mulder [[id number]]  Certification:: I certify this patient will need inpatient services for at least 2 midnights  Expected Medical Readiness: 08/27/2023          B Medical/Surgery History Past Medical History:  Diagnosis Date   Anemia    Hypertension    Irregular heart rate    Ovarian cyst    Past Surgical History:  Procedure Laterality Date   APPENDECTOMY     BREAST ENHANCEMENT SURGERY       A IV Location/Drains/Wounds Patient Lines/Drains/Airways Status     Active Line/Drains/Airways     Name Placement date Placement time Site Days   Peripheral IV 08/23/23  20 G Left;Posterior Hand 08/23/23  --  Hand  less than 1   Peripheral IV 08/23/23 20 G Right Antecubital 08/23/23  0254  Antecubital  less than 1            Intake/Output Last 24 hours No intake or output data in the 24 hours ending 08/23/23 0865  Labs/Imaging Results for orders placed or performed during the hospital encounter of 08/23/23 (from the past 48 hour(s))  CBG monitoring, ED     Status: Abnormal   Collection Time: 08/23/23  1:48 AM  Result Value Ref Range   Glucose-Capillary 160 (H) 70 - 99 mg/dL    Comment: Glucose reference range applies only to samples taken after fasting for at least 8 hours.  Basic metabolic panel     Status: Abnormal   Collection Time: 08/23/23  1:50 AM  Result Value Ref Range   Sodium 135 135 - 145 mmol/L   Potassium 3.6 3.5 - 5.1 mmol/L   Chloride 100 98 - 111 mmol/L   CO2 24 22 - 32 mmol/L   Glucose, Bld 138 (H) 70 - 99 mg/dL    Comment: Glucose reference range applies only to samples taken after fasting for at least 8 hours.   BUN 17 6 - 20 mg/dL   Creatinine, Ser 7.84 0.44 - 1.00 mg/dL   Calcium 8.7 (L) 8.9 - 10.3 mg/dL   GFR, Estimated >69 >62 mL/min    Comment: (NOTE) Calculated using the CKD-EPI Creatinine Equation (2021)  Anion gap 11 5 - 15    Comment: Performed at [facility], [address] [place], [place] [address]  CBC     Status: Abnormal   Collection Time: 08/23/23  1:50 AM  Result Value Ref Range   WBC 10.3 4.0 - 10.5 K/uL   RBC 3.31 (L) 3.87 - 5.11 MIL/uL   Hemoglobin 11.7 (L) 12.0 - 15.0 g/dL   HCT 28.4 (L) 13.2 - 44.0 %   MCV 103.0 (H) 80.0 - 100.0 fL   MCH 35.3 (H) 26.0 - 34.0 pg   MCHC 34.3 30.0 - 36.0 g/dL   RDW 10.2 72.5 - 36.6 %   Platelets 189 150 - 400 K/uL   nRBC 0.0 0.0 - 0.2 %    Comment: Performed at [facility], [address], [place], [place] [address]  hCG, serum, qualitative     Status: None   Collection Time: 08/23/23  1:50 AM  Result Value Ref Range   Preg, Serum NEGATIVE  NEGATIVE    Comment:        THE SENSITIVITY OF THIS METHODOLOGY IS >10 mIU/mL. Performed at [facility], [address], [place], [place] [address]   Lipase, blood     Status: None   Collection Time: 08/23/23  1:50 AM  Result Value Ref Range   Lipase 22 11 - 51 U/L    Comment: Performed at [facility], [address], [place], [place] [address]  Hepatic function panel     Status: Abnormal   Collection Time: 08/23/23  1:50 AM  Result Value Ref Range   Total Protein 5.9 (L) 6.5 - 8.1 g/dL   Albumin 3.2 (L) 3.5 - 5.0 g/dL   AST 23 15 - 41 U/L   ALT 18 0 - 44 U/L   Alkaline Phosphatase 42 38 - 126 U/L   Total Bilirubin 0.7 0.3 - 1.2 mg/dL   Bilirubin, Direct 0.1 0.0 - 0.2 mg/dL   Indirect Bilirubin 0.6 0.3 - 0.9 mg/dL    Comment: Performed at [facility], [address], [place], [place] [address]  ABO/Rh     Status: None   Collection Time: 08/23/23  1:50 AM  Result Value Ref Range   ABO/RH(D)      O POS Performed at [facility], [address], [place], [place] [address]   Urinalysis, Routine w reflex microscopic -Urine, Clean Catch     Status: Abnormal   Collection Time: 08/23/23  2:18 AM  Result Value Ref Range   Color, Urine YELLOW YELLOW   APPearance HAZY (A) CLEAR   Specific Gravity, Urine 1.020 1.005 - 1.030   pH 7.0 5.0 - 8.0   Glucose, UA NEGATIVE NEGATIVE mg/dL   Hgb urine dipstick NEGATIVE NEGATIVE   Bilirubin Urine NEGATIVE NEGATIVE   Ketones, ur 5 (A) NEGATIVE mg/dL   Protein, ur NEGATIVE NEGATIVE mg/dL   Nitrite NEGATIVE NEGATIVE   Leukocytes,Ua TRACE (A) NEGATIVE   RBC / HPF 0-5 0 - 5 RBC/hpf   WBC, UA 0-5 0 - 5 WBC/hpf   Bacteria, UA RARE (A) NONE SEEN   Squamous Epithelial / HPF 11-20 0 - 5 /HPF   Mucus PRESENT     Comment: Performed at [facility], [address] [place], [place] [address]  POC occult blood, ED     Status: Abnormal   Collection Time: 08/23/23  2:24 AM  Result Value Ref Range  Fecal Occult Bld  POSITIVE (A) NEGATIVE  Type and screen [facility]     Status: None   Collection Time: 08/23/23  2:55 AM  Result Value Ref Range   ABO/RH(D) O POS    Antibody Screen NEG    Sample Expiration      08/26/2023,2359 Performed at [facility], [address], [place], [place] [address]    CT ABDOMEN PELVIS W CONTRAST  Result Date: 08/23/2023 CLINICAL DATA:  Acute, nonlocalized abdominal pain EXAM: CT ABDOMEN AND PELVIS WITH CONTRAST TECHNIQUE: Multidetector CT imaging of the abdomen and pelvis was performed using the standard protocol following bolus administration of intravenous contrast. RADIATION DOSE REDUCTION: This exam was performed according to the departmental dose-optimization program which includes automated exposure control, adjustment of the mA and/or kV according to patient size and/or use of iterative reconstruction technique. CONTRAST:  75mL OMNIPAQUE IOHEXOL 350 MG/ML SOLN COMPARISON:  05/20/2023 FINDINGS: Lower chest: Partial coverage of symmetrically inflated breast implants. Hepatobiliary: No focal liver abnormality.No evidence of biliary obstruction or stone. Pancreas: Unremarkable. Spleen: Unremarkable. Adrenals/Urinary Tract: Negative adrenals. No hydronephrosis or stone. Unremarkable bladder. Stomach/Bowel: Circumferential low-density thickening of the distal stomach, chronic since at least 2021. No discrete ulceration and no perforation. No evidence of small bowel or colonic inflammation. Appendectomy. Vascular/Lymphatic: No acute vascular abnormality. Low-density atheromatous wall thickening of the aorta. No mass or adenopathy. Reproductive:No pathologic findings. Other: No ascites or pneumoperitoneum. Musculoskeletal: No acute abnormalities. IMPRESSION: 1. No acute finding when compared to prior. 2. Chronic distal gastric wall thickening 3. Aortic atherosclerosis. Electronically Signed   By: Tiburcio Pea M.D.   On: 08/23/2023 04:56    Pending  Labs Unresulted Labs (From admission, onward)     Start     Ordered   08/23/23 0539  CBC  Now then every 8 hours,   R (with TIMED occurrences)      08/23/23 0539            Vitals/Pain Today's Vitals   08/23/23 0515 08/23/23 0530 08/23/23 0545 08/23/23 0558  BP: 118/85 111/83 117/88   Pulse: 76 67 70   Resp: 18 17 19    Temp:    98.5 F (36.9 C)  TempSrc:    Oral  SpO2: 96% 97% 96%   Weight:      Height:      PainSc:        Isolation Precautions No active isolations  Medications Medications  QUEtiapine (SEROQUEL) tablet 50 mg (has no administration in time range)  acetaminophen (TYLENOL) tablet 650 mg (has no administration in time range)    Or  acetaminophen (TYLENOL) suppository 650 mg (has no administration in time range)  ondansetron (ZOFRAN) tablet 4 mg (has no administration in time range)    Or  ondansetron (ZOFRAN) injection 4 mg (has no administration in time range)  pantoprazole (PROTONIX) injection 40 mg (has no administration in time range)  ondansetron (ZOFRAN) injection 4 mg (4 mg Intravenous Given 08/23/23 0257)  iohexol (OMNIPAQUE) 350 MG/ML injection 75 mL (75 mLs Intravenous Contrast Given 08/23/23 0307)  pantoprazole (PROTONIX) injection 40 mg (40 mg Intravenous Given 08/23/23 0256)  prochlorperazine (COMPAZINE) injection 10 mg (10 mg Intravenous Given 08/23/23 0436)    Mobility walks     Focused Assessments     R Recommendations: See Admitting Provider Note  Report given to:   Additional Notes:

## 2023-08-23 NOTE — TOC Initial Note (Signed)
Transition of Care (TOC) - Initial/Assessment Note   Spoke to patient at bedside.   Patient from home with roommate. At discharge plans to stay with her daughter for a few weeks.   Patient does not have a PCP , agreeable to an appointment at a [facility] . Appointment scheduled and placed on AVS.   ETOH resources placed on AVS  Patient Details  Name: Melanie Moreno MRN: [id number] Date of Birth: [birth year]  Transition of Care [facility]) CM/SW Contact:    Kingsley Plan, RN Phone Number: 08/23/2023, 1:40 PM  Clinical Narrative:                   Expected Discharge Plan: Home/Self Care Barriers to Discharge: Continued Medical Work up   Patient Goals and CMS Choice Patient states their goals for this hospitalization and ongoing recovery are:: to return to home          Expected Discharge Plan and Services   Discharge Planning Services: CM Consult Post Acute Care Choice: NA Living arrangements for the past 2 months: Single Family Home                   DME Agency: NA       HH Arranged: NA          Prior Living Arrangements/Services Living arrangements for the past 2 months: Single Family Home Lives with:: Roommate Patient language and need for interpreter reviewed:: Yes Do you feel safe going back to the place where you live?: Yes      Need for Family Participation in Patient Care: Yes (Comment) Care giver support system in place?: Yes (comment)   Criminal Activity/Legal Involvement Pertinent to Current Situation/Hospitalization: No - Comment as needed  Activities of Daily Living   ADL Screening (condition at time of admission) Independently performs ADLs?: Yes (appropriate for developmental age) Is the patient deaf or have difficulty hearing?: No Does the patient have difficulty seeing, even when wearing glasses/contacts?: Yes Does the patient have difficulty concentrating, remembering, or making decisions?: No  Permission Sought/Granted   Permission  granted to share information with : No              Emotional Assessment Appearance:: Appears stated age Attitude/Demeanor/Rapport: Engaged Affect (typically observed): Accepting Orientation: : Oriented to Self, Oriented to Place, Oriented to  Time, Oriented to Situation Alcohol / Substance Use: Not Applicable Psych Involvement: No (comment)  Admission diagnosis:  GI bleed [K92.2] Acute GI bleeding [K92.2] Syncope, unspecified syncope type [R55] Patient Active Problem List   Diagnosis Date Noted   Acute GI bleeding 08/23/2023   Hematemesis with nausea 08/23/2023   Acute gastric ulcer with hemorrhage 08/23/2023   Abnormal CT scan, stomach 08/23/2023   Migraine headache without aura 07/24/2022   Acute alcoholic pancreatitis 07/21/2022   Essential hypertension 07/05/2022   History of pancreatitis 07/05/2022   Tobacco use disorder 07/05/2022   Closed fracture of right distal radius 01/18/2020   PCP:  Pcp, No Pharmacy:   [facility] [address] [place] - [address] [place] [address] Phone: [phone] Fax: [phone]  [facility] [facility] [address] [place] [address] Phone: [phone] Fax: [phone]  [place] [facility] [address] [place] [address] Phone: [phone] Fax: [phone]  [facility] - [facility] [address] [place] [address] Phone: [phone] Fax: [phone]  [id number] [place],  - [address] AT [facility] [address] [place] [address] Phone: [phone] Fax: [phone]     Social Determinants of Health (SDOH) Social History: SDOH Screenings   Food Insecurity: Food Insecurity Present (08/23/2023)  Housing: Low Risk  (08/23/2023)  Transportation Needs: No Transportation  Needs (08/23/2023)  Utilities: Not At Risk (08/23/2023)  Tobacco Use: High Risk (08/23/2023)   SDOH Interventions: Food Insecurity Interventions: Inpatient TOC   Readmission Risk Interventions     No data to display

## 2023-08-23 NOTE — Discharge Instructions (Signed)
                  Intensive Outpatient Programs  [facility] [place] [facility]    [facility] [address]     [address] [place],  [place]     [place], [place] [phone]      [phone]  [facility]   [facility]  (Inpatient and outpatient)  [phone] (Suboxone and Methadone) [address]           [phone]           [facility]    [facility] - Intensive Outpatient [address]     [address] [place], [place] [address]     [place], [place]  [phone]      [phone]  [facility] (Outpatient, Inpatient, Chemical  [facility] (Groups and Residental) (insurance only) [phone]    [place], [place]          [phone]       [facility]    [facility] (for caregivers and family) [address]     [address] [place], [place]     [place], [place] [phone]      [phone]  Residential Treatment Programs  [facility]  [facility](2 years) Residential: 90 days)  [facility] [address]      [address] [place], [place]     [place], [place] [phone]      [phone] or [phone]  [facility]    [facility] [address]      [address] [place], [place]     [place], [place] [phone]      [phone]  [facility]   [facility] ([facility]) [address]     [address] [place], [place] [address]     [place], [place] [phone]      [phone] Admissions: 8am-3pm M-F  [facility]: Residential Program (90 Days)              [facility]: [facility]  [place], [place]     [place],   [phone] or [phone]    (Walk in Hours over the weekend or by referral)   [facility]: [facility]:[phone] (for crisis  response 24 hours a day) 

## 2023-08-23 NOTE — Progress Notes (Addendum)
Pt presented to endoscopy today for EGD with MD Pyrtle.  Post-operatively, pt c/o nausea which was treated with 4 mg zofran by Kerin Ransom, CRNA. Subsequently, pt had an episode of emesis. MDA Turk notified, VO for 10 mg Barhemsys IV push. Medication given as ordered.  At that time, pt c/o 8/10 abdominal pain localized to the LUQ. MD Pyrtle at bedside to assess. VO for 25 mcg of fentanyl IV push. MDA Turk notified, at which time pt declined fentanyl stating "that stuff scares me". MD Pyrtle notified of pt refusal, changed VO to 2 mg morphine IV push. MDA Turk notified and agreeable to change. Pt agreeable to morphine. Morphine given as ordered.  Eulas Post, RN 08/23/23 11:10 AM  Addendum 1112: MD Amin at bedside. Updated on pt's status.  Addendum 1134: Pain unrelieved by initial 2 mg morphine IV. Pain remains 8/10. MD Pyrtle notified, VO to repeat 2 mg morphine IV push. MDA Turk notified and agreeable to plan. Medication given as ordered. Immediately post-administration of second dose, pt endorses interval improvement in pain, rates pain 7/10. Pt no longer dry-heaving.  Addendum 1140: MD Pyrtle at bedside to reassess. Per MD Pyrtle, pt may be transferred to inpt unit once 30 minutes has elapsed post-morphine administration.

## 2023-08-23 NOTE — Progress Notes (Signed)
PROGRESS NOTE    Melanie Moreno  ZOX:[id number] DOB: 1975-12-22 DOA: 08/23/2023 PCP: Pcp, No   Brief Narrative:  47 year old with history of HTN, alcohol use, tobacco use comes to the ED with 3 days of abdominal pain and hematemesis.  Admits of drinking heavily and using Goody powder.  CT abdomen pelvis showed gastric wall thickening.  GI consulted, started on PPI.   Assessment & Plan:  Principal Problem:   GI bleed   Hematemesis concerning for upper GI bleed Blood loss anemia with macrocytosis - The setting of heavy alcohol use and NSAIDs. GI performed endoscopy which showed nonbleeding esophagitis and nonbleeding gastric ulcers.  Placed on PPI.  Diet as tolerated.  Still having abdominal pain therefore Dilaudid ordered.  Will repeat lipase.  Alcohol use - Withdrawal protocol in place.  Insomnia - Seroquel  Essential hypertension - Metoprolol on hold.  IV as needed.   DVT prophylaxis: SCDs Start: 08/23/23 0538 Code Status: Full code Family Communication:   Status is: Inpatient Still having abdominal pain.  Continue hospital stay    Subjective: Seen post endoscopy, having epigastric abdominal pain.   Examination:  General exam: Appears calm and comfortable  Respiratory system: Clear to auscultation. Respiratory effort normal. Cardiovascular system: S1 & S2 heard, RRR. No JVD, murmurs, rubs, gallops or clicks. No pedal edema. Gastrointestinal system: Abdomen is nondistended, soft and nontender. No organomegaly or masses felt. Normal bowel sounds heard. Central nervous system: Alert and oriented. No focal neurological deficits. Extremities: Symmetric 5 x 5 power. Skin: No rashes, lesions or ulcers Psychiatry: Judgement and insight appear normal. Mood & affect appropriate.      Diet Orders (From admission, onward)     Start     Ordered   08/23/23 0539  Diet NPO time specified  Diet effective now        08/23/23 0539            Objective: Vitals:    08/23/23 0530 08/23/23 0545 08/23/23 0558 08/23/23 0740  BP: 111/83 117/88  131/89  Pulse: 67 70  (!) 59  Resp: 17 19  18   Temp:   98.5 F (36.9 C) 98 F (36.7 C)  TempSrc:   Oral Oral  SpO2: 97% 96%  98%  Weight:      Height:       No intake or output data in the 24 hours ending 08/23/23 0852 Filed Weights   08/23/23 0127  Weight: 54.4 kg    Scheduled Meds:  pantoprazole (PROTONIX) IV  40 mg Intravenous Q12H   QUEtiapine  50 mg Oral QHS   Continuous Infusions:  Nutritional status     Body mass index is 20.6 kg/m.  Data Reviewed:   CBC: Recent Labs  Lab 08/23/23 0150 08/23/23 0555  WBC 10.3 7.7  HGB 11.7* 11.7*  HCT 34.1* 33.9*  MCV 103.0* 104.0*  PLT 189 198   Basic Metabolic Panel: Recent Labs  Lab 08/23/23 0150  NA 135  K 3.6  CL 100  CO2 24  GLUCOSE 138*  BUN 17  CREATININE 0.55  CALCIUM 8.7*   GFR: Estimated Creatinine Clearance: 75.5 mL/min (by C-G formula based on SCr of 0.55 mg/dL). Liver Function Tests: Recent Labs  Lab 08/23/23 0150  AST 23  ALT 18  ALKPHOS 42  BILITOT 0.7  PROT 5.9*  ALBUMIN 3.2*   Recent Labs  Lab 08/23/23 0150  LIPASE 22   No results for input(s): "AMMONIA" in the last 168 hours. Coagulation Profile: No results  for input(s): "INR", "PROTIME" in the last 168 hours. Cardiac Enzymes: No results for input(s): "CKTOTAL", "CKMB", "CKMBINDEX", "TROPONINI" in the last 168 hours. BNP (last 3 results) No results for input(s): "PROBNP" in the last 8760 hours. HbA1C: No results for input(s): "HGBA1C" in the last 72 hours. CBG: Recent Labs  Lab 08/23/23 0148  GLUCAP 160*   Lipid Profile: No results for input(s): "CHOL", "HDL", "LDLCALC", "TRIG", "CHOLHDL", "LDLDIRECT" in the last 72 hours. Thyroid Function Tests: No results for input(s): "TSH", "T4TOTAL", "FREET4", "T3FREE", "THYROIDAB" in the last 72 hours. Anemia Panel: No results for input(s): "VITAMINB12", "FOLATE", "FERRITIN", "TIBC", "IRON",  "RETICCTPCT" in the last 72 hours. Sepsis Labs: No results for input(s): "PROCALCITON", "LATICACIDVEN" in the last 168 hours.  No results found for this or any previous visit (from the past 240 hour(s)).       Radiology Studies: CT ABDOMEN PELVIS W CONTRAST  Result Date: 08/23/2023 CLINICAL DATA:  Acute, nonlocalized abdominal pain EXAM: CT ABDOMEN AND PELVIS WITH CONTRAST TECHNIQUE: Multidetector CT imaging of the abdomen and pelvis was performed using the standard protocol following bolus administration of intravenous contrast. RADIATION DOSE REDUCTION: This exam was performed according to the departmental dose-optimization program which includes automated exposure control, adjustment of the mA and/or kV according to patient size and/or use of iterative reconstruction technique. CONTRAST:  75mL OMNIPAQUE IOHEXOL 350 MG/ML SOLN COMPARISON:  05/20/2023 FINDINGS: Lower chest: Partial coverage of symmetrically inflated breast implants. Hepatobiliary: No focal liver abnormality.No evidence of biliary obstruction or stone. Pancreas: Unremarkable. Spleen: Unremarkable. Adrenals/Urinary Tract: Negative adrenals. No hydronephrosis or stone. Unremarkable bladder. Stomach/Bowel: Circumferential Moreno-density thickening of the distal stomach, chronic since at least 2021. No discrete ulceration and no perforation. No evidence of small bowel or colonic inflammation. Appendectomy. Vascular/Lymphatic: No acute vascular abnormality. Moreno-density atheromatous wall thickening of the aorta. No mass or adenopathy. Reproductive:No pathologic findings. Other: No ascites or pneumoperitoneum. Musculoskeletal: No acute abnormalities. IMPRESSION: 1. No acute finding when compared to prior. 2. Chronic distal gastric wall thickening 3. Aortic atherosclerosis. Electronically Signed   By: Tiburcio Pea M.D.   On: 08/23/2023 04:56           LOS: 0 days   Time spent= 35 mins    Miguel Rota, MD [facility]  If  7PM-7AM, please contact night-coverage  08/23/2023, 8:52 AM

## 2023-08-23 NOTE — Anesthesia Postprocedure Evaluation (Signed)
Anesthesia Post Note  Patient: Melanie Moreno  Procedure(s) Performed: ESOPHAGOGASTRODUODENOSCOPY (EGD) WITH PROPOFOL HOT HEMOSTASIS (ARGON PLASMA COAGULATION/BICAP) SCLEROTHERAPY BIOPSY     Patient location during evaluation: Endoscopy Anesthesia Type: MAC Level of consciousness: oriented, awake and alert and awake Pain management: pain level controlled Vital Signs Assessment: post-procedure vital signs reviewed and stable Respiratory status: spontaneous breathing, nonlabored ventilation and respiratory function stable Cardiovascular status: blood pressure returned to baseline and stable Postop Assessment: no headache and no backache Anesthetic complications: no   No notable events documented.  Last Vitals:  Vitals:   08/23/23 1150 08/23/23 1230  BP: (!) 151/83 (!) 167/92  Pulse: 65 81  Resp: (!) 24 18  Temp:  36.8 C  SpO2: 96% 100%    Last Pain:  Vitals:   08/23/23 1230  TempSrc: Oral  PainSc:                  Collene Schlichter

## 2023-08-23 NOTE — Progress Notes (Signed)
   08/23/23 1529  Mobility  Activity Ambulated independently to bathroom  Level of Assistance Standby assist, set-up cues, supervision of patient - no hands on  Assistive Device None  Distance Ambulated (ft) 30 ft  Activity Response Tolerated fair  Mobility Referral Yes  $Mobility charge 1 Mobility  Mobility Specialist Start Time (ACUTE ONLY) 1519  Mobility Specialist Stop Time (ACUTE ONLY) 1529  Mobility Specialist Time Calculation (min) (ACUTE ONLY) 10 min   Mobility Specialist: Progress Note  Pt agreeable to mobility session - received in bed. Required SB with no AD. C/o upper mid abd pain. Pericare performed ind. Returned to bed with all needs met - call bell within reach.   Barnie Mort, BS Mobility Specialist Please contact via SecureChat or Rehab office at [phone].

## 2023-08-23 NOTE — ED Provider Notes (Signed)
[facility] EMERGENCY DEPARTMENT AT [facility] Provider Note   CSN: [id number] Arrival date & time: 08/23/23  0122     History  Chief Complaint  Patient presents with   Loss of Consciousness   GI Bleeding    Melanie Moreno is a 47 y.o. female.  Patient with past medical history significant for hypertension, pancreatitis, alcohol abuse, tobacco abuse, migraine headaches presents the emergency department complaining of 3 days of generalized abdominal pain, nausea with hematemesis today, burgundy color stool today, with a syncopal episode just prior to arrival.  Patient states she last drink alcohol 2 days ago.  She denies chest pain, shortness of breath, urinary symptoms.  She does endorse a mild headache.   Loss of Consciousness      Home Medications Prior to Admission medications   Medication Sig Start Date End Date Taking? Authorizing Provider  amLODipine (NORVASC) 5 MG tablet Take 1 tablet (5 mg total) by mouth daily. 07/25/22   Kathlen Mody, MD  calcium carbonate (TUMS EX) 750 MG chewable tablet Chew 1 tablet (750 mg total) by mouth daily as needed for heartburn. 07/25/22   Kathlen Mody, MD  carisoprodol (SOMA) 250 MG tablet Take 1 tablet (250 mg total) by mouth 4 (four) times daily as needed (muscle spasms). 12/20/22   Molpus, Jonny Ruiz, MD  hydrALAZINE (APRESOLINE) 10 MG tablet Take 1 tablet (10 mg total) by mouth 3 (three) times daily as needed (If blood pressure over 180 and not responding to other medications.). 04/21/23   Tegeler, Canary Brim, MD  metoprolol tartrate (LOPRESSOR) 50 MG tablet Take 1 tablet (50 mg total) by mouth 2 (two) times daily. 08/07/23 09/06/23  Maxwell Marion, PA-C  nicotine polacrilex (NICORETTE) 2 MG gum Chew 1 each (2 mg total) by mouth as needed for smoking cessation. 07/25/22   Kathlen Mody, MD  pantoprazole (PROTONIX) 40 MG tablet Take 1 tablet (40 mg total) by mouth daily. 05/20/23 06/19/23  Pollyann Savoy, MD  QUEtiapine (SEROQUEL) 50 MG  tablet Take 1 tablet (50 mg total) by mouth at bedtime. 07/25/22 10/23/22  Kathlen Mody, MD  sucralfate (CARAFATE) 1 GM/10ML suspension Take 10 mLs (1 g total) by mouth 4 (four) times daily -  with meals and at bedtime. 05/20/23   Pollyann Savoy, MD      Allergies    Dilaudid [hydromorphone hcl]    Review of Systems   Review of Systems  Cardiovascular:  Positive for syncope.    Physical Exam Updated Vital Signs BP 114/87   Pulse 85   Temp 98.9 F (37.2 C) (Oral)   Resp 12   Ht 5\' 4"  (1.626 m)   Wt 54.4 kg   SpO2 100%   BMI 20.60 kg/m  Physical Exam Vitals and nursing note reviewed.  Constitutional:      General: She is not in acute distress.    Appearance: She is well-developed.  HENT:     Head: Normocephalic and atraumatic.  Eyes:     General: No scleral icterus.    Conjunctiva/sclera: Conjunctivae normal.  Cardiovascular:     Rate and Rhythm: Normal rate and regular rhythm.     Heart sounds: No murmur heard. Pulmonary:     Effort: Pulmonary effort is normal. No respiratory distress.     Breath sounds: Normal breath sounds.  Abdominal:     Palpations: Abdomen is soft.     Tenderness: There is abdominal tenderness (Generalized, worse in left upper quadrant).  Musculoskeletal:  General: No swelling.     Cervical back: Neck supple.  Skin:    General: Skin is warm and dry.     Capillary Refill: Capillary refill takes less than 2 seconds.     Coloration: Skin is not jaundiced.  Neurological:     Mental Status: She is alert.  Psychiatric:        Mood and Affect: Mood normal.     ED Results / Procedures / Treatments   Labs (all labs ordered are listed, but only abnormal results are displayed) Labs Reviewed  BASIC METABOLIC PANEL - Abnormal; Notable for the following components:      Result Value   Glucose, Bld 138 (*)    Calcium 8.7 (*)    All other components within normal limits  CBC - Abnormal; Notable for the following components:   RBC 3.31 (*)     Hemoglobin 11.7 (*)    HCT 34.1 (*)    MCV 103.0 (*)    MCH 35.3 (*)    All other components within normal limits  URINALYSIS, ROUTINE W REFLEX MICROSCOPIC - Abnormal; Notable for the following components:   APPearance HAZY (*)    Ketones, ur 5 (*)    Leukocytes,Ua TRACE (*)    Bacteria, UA RARE (*)    All other components within normal limits  HEPATIC FUNCTION PANEL - Abnormal; Notable for the following components:   Total Protein 5.9 (*)    Albumin 3.2 (*)    All other components within normal limits  CBG MONITORING, ED - Abnormal; Notable for the following components:   Glucose-Capillary 160 (*)    All other components within normal limits  POC OCCULT BLOOD, ED - Abnormal; Notable for the following components:   Fecal Occult Bld POSITIVE (*)    All other components within normal limits  HCG, SERUM, QUALITATIVE  LIPASE, BLOOD  TYPE AND SCREEN  ABO/RH    EKG EKG Interpretation Date/Time:  Friday August 23 2023 01:27:03 EDT Ventricular Rate:  69 PR Interval:  165 QRS Duration:  81 QT Interval:  420 QTC Calculation: 450 R Axis:   71  Text Interpretation: Sinus rhythm Consider left ventricular hypertrophy When compared with ECG of 07/05/2023. HEART RATE has changed Confirmed by Dione Booze ([id number]) on 08/23/2023 1:33:31 AM  Radiology CT ABDOMEN PELVIS W CONTRAST  Result Date: 08/23/2023 CLINICAL DATA:  Acute, nonlocalized abdominal pain EXAM: CT ABDOMEN AND PELVIS WITH CONTRAST TECHNIQUE: Multidetector CT imaging of the abdomen and pelvis was performed using the standard protocol following bolus administration of intravenous contrast. RADIATION DOSE REDUCTION: This exam was performed according to the departmental dose-optimization program which includes automated exposure control, adjustment of the mA and/or kV according to patient size and/or use of iterative reconstruction technique. CONTRAST:  75mL OMNIPAQUE IOHEXOL 350 MG/ML SOLN COMPARISON:  05/20/2023 FINDINGS: Lower  chest: Partial coverage of symmetrically inflated breast implants. Hepatobiliary: No focal liver abnormality.No evidence of biliary obstruction or stone. Pancreas: Unremarkable. Spleen: Unremarkable. Adrenals/Urinary Tract: Negative adrenals. No hydronephrosis or stone. Unremarkable bladder. Stomach/Bowel: Circumferential low-density thickening of the distal stomach, chronic since at least 2021. No discrete ulceration and no perforation. No evidence of small bowel or colonic inflammation. Appendectomy. Vascular/Lymphatic: No acute vascular abnormality. Low-density atheromatous wall thickening of the aorta. No mass or adenopathy. Reproductive:No pathologic findings. Other: No ascites or pneumoperitoneum. Musculoskeletal: No acute abnormalities. IMPRESSION: 1. No acute finding when compared to prior. 2. Chronic distal gastric wall thickening 3. Aortic atherosclerosis. Electronically Signed   By: Christiane Ha  Watts M.D.   On: 08/23/2023 04:56    Procedures Procedures    Medications Ordered in ED Medications  octreotide (SANDOSTATIN) 2 mcg/mL load via infusion 50 mcg (has no administration in time range)    And  octreotide (SANDOSTATIN) 500 mcg in sodium chloride 0.9 % 250 mL (2 mcg/mL) infusion (has no administration in time range)  cefTRIAXone (ROCEPHIN) 1 g in sodium chloride 0.9 % 100 mL IVPB (has no administration in time range)  ondansetron (ZOFRAN) injection 4 mg (4 mg Intravenous Given 08/23/23 0257)  iohexol (OMNIPAQUE) 350 MG/ML injection 75 mL (75 mLs Intravenous Contrast Given 08/23/23 0307)  pantoprazole (PROTONIX) injection 40 mg (40 mg Intravenous Given 08/23/23 0256)  prochlorperazine (COMPAZINE) injection 10 mg (10 mg Intravenous Given 08/23/23 0436)    ED Course/ Medical Decision Making/ A&P                                 Medical Decision Making Amount and/or Complexity of Data Reviewed Labs: ordered. Radiology: ordered.  Risk Prescription drug management.   This patient  presents to the ED for concern of syncope with hematemesis, blood in stool, this involves an extensive number of treatment options, and is a complaint that carries with it a high risk of complications and morbidity.  The differential diagnosis includes GI bleed, symptomatic anemia, dysrhythmia, metabolic abnormality, others   Co morbidities that complicate the patient evaluation  Pancreatitis, alcohol abuse   Additional history obtained:  Additional history obtained from EMS   Lab Tests:  I Ordered, and personally interpreted labs.  The pertinent results include: Hemoglobin 11.7, negative pregnancy test, albumin 3.2, lipase 22   Imaging Studies ordered:  I ordered imaging studies including CT abdomen pelvis with contrast I independently visualized and interpreted imaging which showed  1. No acute finding when compared to prior.  2. Chronic distal gastric wall thickening  3. Aortic atherosclerosis.   I agree with the radiologist interpretation   Cardiac Monitoring: / EKG:  The patient was maintained on a cardiac monitor.  I personally viewed and interpreted the cardiac monitored which showed an underlying rhythm of:   EKG Interpretation Date/Time:  Friday August 23 2023 01:27:03 EDT Ventricular Rate:  69 PR Interval:  165 QRS Duration:  81 QT Interval:  420 QTC Calculation: 450 R Axis:   71  Text Interpretation: Sinus rhythm Consider left ventricular hypertrophy When compared with ECG of 07/05/2023. HEART RATE has changed Confirmed by Dione Booze ([id number]) on 08/23/2023 1:33:31 AM          Consultations Obtained:        I sent a secure chat to the on call gastroenterologist, Dr.Danis. Requested that patient be added to rounding list.  I requested consultation with the hospitalist,  Dr.Dorrell, and discussed lab and imaging findings as well as pertinent plan - they recommend: adding octreotide, ceftriaxone, admission   Problem List / ED Course / Critical  interventions / Medication management   I ordered medication including pantoprazole, Rocephin, octreotide for GI bleed, compazine  for headache  Reevaluation of the patient after these medicines showed that the patient improved I have reviewed the patients home medicines and have made adjustments as needed   Social Determinants of Health:  Social Determinants of Health with Concerns   Tobacco Use: High Risk (08/23/2023)   Patient History    Smoking Tobacco Use: Every Day    Smokeless Tobacco Use: Never  Passive Exposure: Not on file  Financial Resource Strain: Not on file  Physical Activity: Not on file  Stress: Not on file  Social Connections: Not on file  Depression (UJW1-1): Not on file  Alcohol Screen: Not on file  Health Literacy: Not on file      Test / Admission - Considered:  Patient with active GI bleeding.  Unclear source at this time.  I have messaged the on-call gastroenterologist to have patient added to list.  Feel the patient would benefit from admission for formal evaluation by gastroenterology in the set of active GI bleeding.  Hemoglobin is above 11 at this time, no indication for transfusion.  Patient does endorse a mild headache which was treated with Compazine.  Headache was not the worst of her life and not sudden in onset.         Final Clinical Impression(s) / ED Diagnoses Final diagnoses:  Acute GI bleeding  Syncope, unspecified syncope type    Rx / DC Orders ED Discharge Orders     None         Pamala Duffel 08/23/23 0527    Dione Booze, MD 08/23/23 [phone]

## 2023-08-24 DIAGNOSIS — K922 Gastrointestinal hemorrhage, unspecified: Secondary | ICD-10-CM | POA: Diagnosis not present

## 2023-08-24 LAB — BASIC METABOLIC PANEL
Anion gap: 8 (ref 5–15)
BUN: 7 mg/dL (ref 6–20)
CO2: 26 mmol/L (ref 22–32)
Calcium: 9.2 mg/dL (ref 8.9–10.3)
Chloride: 101 mmol/L (ref 98–111)
Creatinine, Ser: 0.67 mg/dL (ref 0.44–1.00)
GFR, Estimated: >60 mL/min (ref >60)
Glucose, Bld: 90 mg/dL (ref 70–99)
Potassium: 4 mmol/L (ref 3.5–5.1)
Sodium: 135 mmol/L (ref 135–145)

## 2023-08-24 LAB — CBC
HCT: 32.4 % — ABNORMAL LOW (ref 36.0–46.0)
HCT: 33.5 % — ABNORMAL LOW (ref 36.0–46.0)
Hemoglobin: 11.1 g/dL — ABNORMAL LOW (ref 12.0–15.0)
Hemoglobin: 11.7 g/dL — ABNORMAL LOW (ref 12.0–15.0)
MCH: 35.6 pg — ABNORMAL HIGH (ref 26.0–34.0)
MCH: 35.7 pg — ABNORMAL HIGH (ref 26.0–34.0)
MCHC: 34.3 g/dL (ref 30.0–36.0)
MCHC: 34.9 g/dL (ref 30.0–36.0)
MCV: 103.8 fL — ABNORMAL HIGH (ref 80.0–100.0)
MCV: 102.1 fL — ABNORMAL HIGH (ref 80.0–100.0)
Platelets: 174 10*3/uL (ref 150–400)
Platelets: 202 10*3/uL (ref 150–400)
RBC: 3.12 MIL/uL — ABNORMAL LOW (ref 3.87–5.11)
RBC: 3.28 MIL/uL — ABNORMAL LOW (ref 3.87–5.11)
RDW: 13.2 % (ref 11.5–15.5)
RDW: 13 % (ref 11.5–15.5)
WBC: 6.1 10*3/uL (ref 4.0–10.5)
WBC: 7.5 10*3/uL (ref 4.0–10.5)
nRBC: 0 % (ref 0.0–0.2)
nRBC: 0 % (ref 0.0–0.2)

## 2023-08-24 LAB — MAGNESIUM: Magnesium: 1.8 mg/dL (ref 1.7–2.4)

## 2023-08-24 NOTE — Hospital Course (Addendum)
arrative:  47 year old with history of HTN, alcohol use, tobacco use comes to the ED with 3 days of abdominal pain and hematemesis.  Admits of drinking heavily and using Goody powder.  CT abdomen pelvis showed gastric wall thickening.  GI performed endoscopy which showed nonbleeding esophagitis and nonbleeding gastric ulcers.  Placed on PPI.       Assessment & Plan:  Principal Problem:   GI bleed   Hematemesis concerning for upper GI bleed Blood loss anemia with macrocytosis - The setting of heavy alcohol use and NSAIDs. GI performed endoscopy which showed nonbleeding esophagitis and nonbleeding gastric ulcers.  Placed on PPI.  Diet as tolerated. Lipase is normal.    Alcohol use - Withdrawal protocol in place.   Insomnia - Seroquel   Essential hypertension - Metoprolol on hold.  IV as needed.     DVT prophylaxis: SCDs Start: 08/23/23 0538 Code Status: Full code Family Communication:   Status is: Inpatient Still having abdominal pain.  Continue hospital stay through the weekend per GI       Subjective: Abdominal pain is better.  No other complaints     Examination:   General exam: Appears calm and comfortable  Respiratory system: Clear to auscultation. Respiratory effort normal. Cardiovascular system: S1 & S2 heard, RRR. No JVD, murmurs, rubs, gallops or clicks. No pedal edema. Gastrointestinal system: Abdomen is nondistended, soft and nontender. No organomegaly or masses felt. Normal bowel sounds heard. Central nervous system: Alert and oriented. No focal neurological deficits. Extremities: Symmetric 5 x 5 power. Skin: No rashes, lesions or ulcers Psychiatry: Judgement and insight appear normal. Mood & affect appropriate.

## 2023-08-24 NOTE — Plan of Care (Signed)

## 2023-08-24 NOTE — Progress Notes (Signed)
PROGRESS NOTE    Melanie Moreno  UVO:[id number] DOB: [birth year] DOA: 08/23/2023 PCP: Pcp, No    arrative:  47 year old with history of HTN, alcohol use, tobacco use comes to the ED with 3 days of abdominal pain and hematemesis.  Admits of drinking heavily and using Goody powder.  CT abdomen pelvis showed gastric wall thickening.  GI performed endoscopy which showed nonbleeding esophagitis and nonbleeding gastric ulcers.  Placed on PPI.       Assessment & Plan:  Principal Problem:   GI bleed   Hematemesis concerning for upper GI bleed Blood loss anemia with macrocytosis - The setting of heavy alcohol use and NSAIDs. GI performed endoscopy which showed nonbleeding esophagitis and nonbleeding gastric ulcers.  Placed on PPI.  Diet as tolerated. Lipase is normal.    Alcohol use - Withdrawal protocol in place.   Insomnia - Seroquel   Essential hypertension - Metoprolol on hold.  IV as needed.     DVT prophylaxis: SCDs Start: 08/23/23 0538 Code Status: Full code Family Communication:   Status is: Inpatient Still having abdominal pain.  Continue hospital stay through the weekend per GI       Subjective: Abdominal pain is better.  No other complaints     Examination:   General exam: Appears calm and comfortable  Respiratory system: Clear to auscultation. Respiratory effort normal. Cardiovascular system: S1 & S2 heard, RRR. No JVD, murmurs, rubs, gallops or clicks. No pedal edema. Gastrointestinal system: Abdomen is nondistended, soft and nontender. No organomegaly or masses felt. Normal bowel sounds heard. Central nervous system: Alert and oriented. No focal neurological deficits. Extremities: Symmetric 5 x 5 power. Skin: No rashes, lesions or ulcers Psychiatry: Judgement and insight appear normal. Mood & affect appropriate.               Diet Orders (From admission, onward)     Start     Ordered   08/24/23 0919  Diet full liquid Room service appropriate? Yes;  Fluid consistency: Thin  Diet effective now       Question Answer Comment  Room service appropriate? Yes   Fluid consistency: Thin      08/24/23 0918            Objective: Vitals:   08/23/23 2007 08/24/23 0010 08/24/23 0545 08/24/23 0754  BP: 126/73 114/66 98/67 118/86  Pulse: 65 81 63 68  Resp: 18 18 18 16   Temp: 98.4 F (36.9 C) 97.8 F (36.6 C) 97.9 F (36.6 C) 97.7 F (36.5 C)  TempSrc:    Oral  SpO2: 99% 100% 98% 98%  Weight:      Height:       No intake or output data in the 24 hours ending 08/24/23 1059 Filed Weights   08/23/23 0127  Weight: 54.4 kg    Scheduled Meds:  folic acid  1 mg Oral Daily   multivitamin with minerals  1 tablet Oral Daily   pantoprazole (PROTONIX) IV  40 mg Intravenous Q6H   thiamine  100 mg Oral Daily   Or   thiamine  100 mg Intravenous Daily   Continuous Infusions:  Nutritional status     Body mass index is 20.6 kg/m.  Data Reviewed:   CBC: Recent Labs  Lab 08/23/23 0150 08/23/23 0555 08/23/23 1239 08/23/23 1733 08/24/23 0751  WBC 10.3 7.7 8.6 6.9 6.1  HGB 11.7* 11.7* 11.6* 10.9* 11.1*  HCT 34.1* 33.9* 33.8* 31.8* 32.4*  MCV 103.0* 104.0* 102.7* 102.9* 103.8*  PLT  189 198 206 190 174   Basic Metabolic Panel: Recent Labs  Lab 08/23/23 0150 08/24/23 0751  NA 135 135  K 3.6 4.0  CL 100 101  CO2 24 26  GLUCOSE 138* 90  BUN 17 7  CREATININE 0.55 0.67  CALCIUM 8.7* 9.2  MG  --  1.8   GFR: Estimated Creatinine Clearance: 75.5 mL/min (by C-G formula based on SCr of 0.67 mg/dL). Liver Function Tests: Recent Labs  Lab 08/23/23 0150  AST 23  ALT 18  ALKPHOS 42  BILITOT 0.7  PROT 5.9*  ALBUMIN 3.2*   Recent Labs  Lab 08/23/23 0150 08/23/23 1239  LIPASE 22 19   No results for input(s): "AMMONIA" in the last 168 hours. Coagulation Profile: No results for input(s): "INR", "PROTIME" in the last 168 hours. Cardiac Enzymes: No results for input(s): "CKTOTAL", "CKMB", "CKMBINDEX", "TROPONINI" in the  last 168 hours. BNP (last 3 results) No results for input(s): "PROBNP" in the last 8760 hours. HbA1C: No results for input(s): "HGBA1C" in the last 72 hours. CBG: Recent Labs  Lab 08/23/23 0148  GLUCAP 160*   Lipid Profile: No results for input(s): "CHOL", "HDL", "LDLCALC", "TRIG", "CHOLHDL", "LDLDIRECT" in the last 72 hours. Thyroid Function Tests: No results for input(s): "TSH", "T4TOTAL", "FREET4", "T3FREE", "THYROIDAB" in the last 72 hours. Anemia Panel: No results for input(s): "VITAMINB12", "FOLATE", "FERRITIN", "TIBC", "IRON", "RETICCTPCT" in the last 72 hours. Sepsis Labs: No results for input(s): "PROCALCITON", "LATICACIDVEN" in the last 168 hours.  No results found for this or any previous visit (from the past 240 hour(s)).       Radiology Studies: CT ABDOMEN PELVIS W CONTRAST  Result Date: 08/23/2023 CLINICAL DATA:  Acute, nonlocalized abdominal pain EXAM: CT ABDOMEN AND PELVIS WITH CONTRAST TECHNIQUE: Multidetector CT imaging of the abdomen and pelvis was performed using the standard protocol following bolus administration of intravenous contrast. RADIATION DOSE REDUCTION: This exam was performed according to the departmental dose-optimization program which includes automated exposure control, adjustment of the mA and/or kV according to patient size and/or use of iterative reconstruction technique. CONTRAST:  75mL OMNIPAQUE IOHEXOL 350 MG/ML SOLN COMPARISON:  05/20/2023 FINDINGS: Lower chest: Partial coverage of symmetrically inflated breast implants. Hepatobiliary: No focal liver abnormality.No evidence of biliary obstruction or stone. Pancreas: Unremarkable. Spleen: Unremarkable. Adrenals/Urinary Tract: Negative adrenals. No hydronephrosis or stone. Unremarkable bladder. Stomach/Bowel: Circumferential low-density thickening of the distal stomach, chronic since at least 2021. No discrete ulceration and no perforation. No evidence of small bowel or colonic inflammation.  Appendectomy. Vascular/Lymphatic: No acute vascular abnormality. Low-density atheromatous wall thickening of the aorta. No mass or adenopathy. Reproductive:No pathologic findings. Other: No ascites or pneumoperitoneum. Musculoskeletal: No acute abnormalities. IMPRESSION: 1. No acute finding when compared to prior. 2. Chronic distal gastric wall thickening 3. Aortic atherosclerosis. Electronically Signed   By: Tiburcio Pea M.D.   On: 08/23/2023 04:56           LOS: 1 day   Time spent= 35 mins    Miguel Rota, MD [facility]  If 7PM-7AM, please contact night-coverage  08/24/2023, 10:59 AM

## 2023-08-24 NOTE — Progress Notes (Signed)
Mobility Specialist Progress Note:    08/24/23 1546  Mobility  Activity Ambulated with assistance in hallway  Level of Assistance Standby assist, set-up cues, supervision of patient - no hands on  Assistive Device Other (Comment) (Hand Railings)  Distance Ambulated (ft) 150 ft  Activity Response Tolerated well  Mobility Referral Yes  $Mobility charge 1 Mobility  Mobility Specialist Start Time (ACUTE ONLY) 1350  Mobility Specialist Stop Time (ACUTE ONLY) 1405  Mobility Specialist Time Calculation (min) (ACUTE ONLY) 15 min   Received pt in bed having no complaints and agreeable to mobility. C/o mild abd pain and lower back pain otherwise no c/o. Returned to room w/o fault. Left in bed w/ call bell in reach and all needs met. RN notified.   Thompson Grayer Mobility Specialist  Please contact vis Secure Chat or  Rehab Office [phone]

## 2023-08-24 NOTE — Progress Notes (Signed)
Subjective: Feeling nauseous.  No reports of any bleeding.  Objective: Vital signs in last 24 hours: Temp:  [97.7 F (36.5 C)-98.4 F (36.9 C)] 97.7 F (36.5 C) (11/02 0754) Pulse Rate:  [56-104] 68 (11/02 0754) Resp:  [11-26] 16 (11/02 0754) BP: (98-184)/(66-122) 118/86 (11/02 0754) SpO2:  [96 %-100 %] 98 % (11/02 0754) Last BM Date : 08/22/23  Intake/Output from previous day: 11/01 0701 - 11/02 0700 In: 150 [I.V.:150] Out: -  Intake/Output this shift: No intake/output data recorded.  General appearance: alert and no distress GI: some upper abdominal tenderness  Lab Results: Recent Labs    08/23/23 1239 08/23/23 1733 08/24/23 0751  WBC 8.6 6.9 6.1  HGB 11.6* 10.9* 11.1*  HCT 33.8* 31.8* 32.4*  PLT 206 190 174   BMET Recent Labs    08/23/23 0150 08/24/23 0751  NA 135 135  K 3.6 4.0  CL 100 101  CO2 24 26  GLUCOSE 138* 90  BUN 17 7  CREATININE 0.55 0.67  CALCIUM 8.7* 9.2   LFT Recent Labs    08/23/23 0150  PROT 5.9*  ALBUMIN 3.2*  AST 23  ALT 18  ALKPHOS 42  BILITOT 0.7  BILIDIR 0.1  IBILI 0.6   PT/INR No results for input(s): "LABPROT", "INR" in the last 72 hours. Hepatitis Panel No results for input(s): "HEPBSAG", "HCVAB", "HEPAIGM", "HEPBIGM" in the last 72 hours. C-Diff No results for input(s): "CDIFFTOX" in the last 72 hours. Fecal Lactopherrin No results for input(s): "FECLLACTOFRN" in the last 72 hours.  Studies/Results: CT ABDOMEN PELVIS W CONTRAST  Result Date: 08/23/2023 CLINICAL DATA:  Acute, nonlocalized abdominal pain EXAM: CT ABDOMEN AND PELVIS WITH CONTRAST TECHNIQUE: Multidetector CT imaging of the abdomen and pelvis was performed using the standard protocol following bolus administration of intravenous contrast. RADIATION DOSE REDUCTION: This exam was performed according to the departmental dose-optimization program which includes automated exposure control, adjustment of the mA and/or kV according to patient size and/or use  of iterative reconstruction technique. CONTRAST:  75mL OMNIPAQUE IOHEXOL 350 MG/ML SOLN COMPARISON:  05/20/2023 FINDINGS: Lower chest: Partial coverage of symmetrically inflated breast implants. Hepatobiliary: No focal liver abnormality.No evidence of biliary obstruction or stone. Pancreas: Unremarkable. Spleen: Unremarkable. Adrenals/Urinary Tract: Negative adrenals. No hydronephrosis or stone. Unremarkable bladder. Stomach/Bowel: Circumferential low-density thickening of the distal stomach, chronic since at least 2021. No discrete ulceration and no perforation. No evidence of small bowel or colonic inflammation. Appendectomy. Vascular/Lymphatic: No acute vascular abnormality. Low-density atheromatous wall thickening of the aorta. No mass or adenopathy. Reproductive:No pathologic findings. Other: No ascites or pneumoperitoneum. Musculoskeletal: No acute abnormalities. IMPRESSION: 1. No acute finding when compared to prior. 2. Chronic distal gastric wall thickening 3. Aortic atherosclerosis. Electronically Signed   By: Tiburcio Pea M.D.   On: 08/23/2023 04:56    Medications: Scheduled:  folic acid  1 mg Oral Daily   multivitamin with minerals  1 tablet Oral Daily   pantoprazole (PROTONIX) IV  40 mg Intravenous Q6H   thiamine  100 mg Oral Daily   Or   thiamine  100 mg Intravenous Daily   Continuous:  Assessment/Plan: 1) Gastric ulcer (Forrest IIA) s/p BICAP. 2) LA Grade A esophagitis.   There was a slight drift down with the HGB.  No overt signs of bleeding.    Plan: 1) Continue with PPI q6 hours. 2) Maintain full liquid diet for now. 3) Follow HGB and transfuse as necessary. 4) Await gastric biopsies.  LOS: 1 day   Preslynn Bier D  08/24/2023, 9:55 AM

## 2023-08-25 DIAGNOSIS — K922 Gastrointestinal hemorrhage, unspecified: Secondary | ICD-10-CM | POA: Diagnosis not present

## 2023-08-25 LAB — CBC
HCT: 31.6 % — ABNORMAL LOW (ref 36.0–46.0)
Hemoglobin: 11.2 g/dL — ABNORMAL LOW (ref 12.0–15.0)
MCH: 36.4 pg — ABNORMAL HIGH (ref 26.0–34.0)
MCHC: 35.4 g/dL (ref 30.0–36.0)
MCV: 102.6 fL — ABNORMAL HIGH (ref 80.0–100.0)
Platelets: 181 10*3/uL (ref 150–400)
RBC: 3.08 MIL/uL — ABNORMAL LOW (ref 3.87–5.11)
RDW: 13.1 % (ref 11.5–15.5)
WBC: 5.7 10*3/uL (ref 4.0–10.5)
nRBC: 0 % (ref 0.0–0.2)

## 2023-08-25 LAB — BASIC METABOLIC PANEL
Anion gap: 11 (ref 5–15)
BUN: 9 mg/dL (ref 6–20)
CO2: 24 mmol/L (ref 22–32)
Calcium: 9.2 mg/dL (ref 8.9–10.3)
Chloride: 101 mmol/L (ref 98–111)
Creatinine, Ser: 0.64 mg/dL (ref 0.44–1.00)
GFR, Estimated: >60 mL/min (ref >60)
Glucose, Bld: 92 mg/dL (ref 70–99)
Potassium: 4 mmol/L (ref 3.5–5.1)
Sodium: 136 mmol/L (ref 135–145)

## 2023-08-25 LAB — MAGNESIUM: Magnesium: 1.9 mg/dL (ref 1.7–2.4)

## 2023-08-25 MED ORDER — MORPHINE SULFATE (PF) 2 MG/ML IV SOLN
2.0000 mg | Freq: Once | INTRAVENOUS | Status: AC
  Administered 2023-08-25: 2 mg via INTRAVENOUS
  Filled 2023-08-25: qty 1

## 2023-08-25 NOTE — Progress Notes (Signed)
PROGRESS NOTE    Melanie Moreno  NWG:[id number] DOB: August 22, 1976 DOA: 08/23/2023 PCP: Pcp, No   Brief Narrative:  47 year old with history of HTN, alcohol use, tobacco use presented with abdominal pain and hematemesis.  CT of abdomen and pelvis showed gastric wall thickening.  She was started on IV Protonix.  She underwent EGD which showed nonbleeding esophagitis and nonbleeding gastric ulcers.  Assessment & Plan:   Possible upper GI bleeding presenting with hematemesis Acute blood loss anemia Macrocytosis Nonbleeding esophagitis and nonbleeding gastric ulcers -GI following.  Continue IV Protonix. underwent EGD which showed nonbleeding esophagitis and nonbleeding gastric ulcers. -GI advancing diet today.  Possible discharge in a.m. if tolerates diet. -Hemoglobin stable.  Monitor.  History of alcohol use -Continue CIWA protocol.  Continue thiamine, multivitamin and folic acid  Essential hypertension -Blood pressure currently stable.  DVT prophylaxis: SCDs Code Status: Full Family Communication: None at bedside Disposition Plan: Status is: Inpatient Remains inpatient appropriate because: Of severity of illness  Consultants: GI  Procedures: As above  Antimicrobials: None   Subjective: Patient seen and examined at bedside.  Had nausea and vomiting this morning.  No fever, chest pain, shortness of breath reported.  Objective: Vitals:   08/24/23 0754 08/24/23 1529 08/24/23 2010 08/25/23 0829  BP: 118/86 123/77 120/80 (!) 177/84  Pulse: 68 97 95 64  Resp: 16 17  19   Temp: 97.7 F (36.5 C) 98.2 F (36.8 C) 98.7 F (37.1 C) 98.8 F (37.1 C)  TempSrc: Oral Oral Oral   SpO2: 98% 100% 100% 100%  Weight:      Height:        Intake/Output Summary (Last 24 hours) at 08/25/2023 1005 Last data filed at 08/24/2023 1530 Gross per 24 hour  Intake 240 ml  Output --  Net 240 ml   Filed Weights   08/23/23 0127  Weight: 54.4 kg    Examination:  General exam: Appears calm  and comfortable.  On room air.  Slow to respond.  Poor historian.  Flat affect. Respiratory system: Bilateral decreased breath sounds at bases, no wheezing Cardiovascular system: S1 & S2 heard, Rate controlled Gastrointestinal system: Abdomen is nondistended, soft and mildly tender in the epigastric region.  Normal bowel sounds heard. Extremities: No cyanosis, clubbing, edema   Data Reviewed: I have personally reviewed following labs and imaging studies  CBC: Recent Labs  Lab 08/23/23 1239 08/23/23 1733 08/24/23 0751 08/24/23 1404 08/25/23 0357  WBC 8.6 6.9 6.1 7.5 5.7  HGB 11.6* 10.9* 11.1* 11.7* 11.2*  HCT 33.8* 31.8* 32.4* 33.5* 31.6*  MCV 102.7* 102.9* 103.8* 102.1* 102.6*  PLT 206 190 174 202 181   Basic Metabolic Panel: Recent Labs  Lab 08/23/23 0150 08/24/23 0751 08/25/23 0357  NA 135 135 136  K 3.6 4.0 4.0  CL 100 101 101  CO2 24 26 24   GLUCOSE 138* 90 92  BUN 17 7 9   CREATININE 0.55 0.67 0.64  CALCIUM 8.7* 9.2 9.2  MG  --  1.8 1.9   GFR: Estimated Creatinine Clearance: 75.5 mL/min (by C-G formula based on SCr of 0.64 mg/dL). Liver Function Tests: Recent Labs  Lab 08/23/23 0150  AST 23  ALT 18  ALKPHOS 42  BILITOT 0.7  PROT 5.9*  ALBUMIN 3.2*   Recent Labs  Lab 08/23/23 0150 08/23/23 1239  LIPASE 22 19   No results for input(s): "AMMONIA" in the last 168 hours. Coagulation Profile: No results for input(s): "INR", "PROTIME" in the last 168 hours. Cardiac Enzymes: No  results for input(s): "CKTOTAL", "CKMB", "CKMBINDEX", "TROPONINI" in the last 168 hours. BNP (last 3 results) No results for input(s): "PROBNP" in the last 8760 hours. HbA1C: No results for input(s): "HGBA1C" in the last 72 hours. CBG: Recent Labs  Lab 08/23/23 0148  GLUCAP 160*   Lipid Profile: No results for input(s): "CHOL", "HDL", "LDLCALC", "TRIG", "CHOLHDL", "LDLDIRECT" in the last 72 hours. Thyroid Function Tests: No results for input(s): "TSH", "T4TOTAL", "FREET4",  "T3FREE", "THYROIDAB" in the last 72 hours. Anemia Panel: No results for input(s): "VITAMINB12", "FOLATE", "FERRITIN", "TIBC", "IRON", "RETICCTPCT" in the last 72 hours. Sepsis Labs: No results for input(s): "PROCALCITON", "LATICACIDVEN" in the last 168 hours.  No results found for this or any previous visit (from the past 240 hour(s)).       Radiology Studies: No results found.      Scheduled Meds:  folic acid  1 mg Oral Daily   multivitamin with minerals  1 tablet Oral Daily   pantoprazole (PROTONIX) IV  40 mg Intravenous Q6H   thiamine  100 mg Oral Daily   Or   thiamine  100 mg Intravenous Daily   Continuous Infusions:        Glade Lloyd, MD [facility] 08/25/2023, 10:05 AM

## 2023-08-25 NOTE — Plan of Care (Signed)

## 2023-08-25 NOTE — Progress Notes (Signed)
Subjective: Feeling better, but still with some nausea.  Objective: Vital signs in last 24 hours: Temp:  [98.2 F (36.8 C)-98.7 F (37.1 C)] 98.7 F (37.1 C) (11/02 2010) Pulse Rate:  [95-97] 95 (11/02 2010) Resp:  [17] 17 (11/02 1529) BP: (120-123)/(77-80) 120/80 (11/02 2010) SpO2:  [100 %] 100 % (11/02 2010) Last BM Date : 08/22/23  Intake/Output from previous day: 11/02 0701 - 11/03 0700 In: 520 [P.O.:520] Out: -  Intake/Output this shift: No intake/output data recorded.  General appearance: alert and no distress GI: soft, non-tender; bowel sounds normal; no masses,  no organomegaly  Lab Results: Recent Labs    08/24/23 0751 08/24/23 1404 08/25/23 0357  WBC 6.1 7.5 5.7  HGB 11.1* 11.7* 11.2*  HCT 32.4* 33.5* 31.6*  PLT 174 202 181   BMET Recent Labs    08/23/23 0150 08/24/23 0751 08/25/23 0357  NA 135 135 136  K 3.6 4.0 4.0  CL 100 101 101  CO2 24 26 24   GLUCOSE 138* 90 92  BUN 17 7 9   CREATININE 0.55 0.67 0.64  CALCIUM 8.7* 9.2 9.2   LFT Recent Labs    08/23/23 0150  PROT 5.9*  ALBUMIN 3.2*  AST 23  ALT 18  ALKPHOS 42  BILITOT 0.7  BILIDIR 0.1  IBILI 0.6   PT/INR No results for input(s): "LABPROT", "INR" in the last 72 hours. Hepatitis Panel No results for input(s): "HEPBSAG", "HCVAB", "HEPAIGM", "HEPBIGM" in the last 72 hours. C-Diff No results for input(s): "CDIFFTOX" in the last 72 hours. Fecal Lactopherrin No results for input(s): "FECLLACTOFRN" in the last 72 hours.  Studies/Results: No results found.  Medications: Scheduled:  folic acid  1 mg Oral Daily   multivitamin with minerals  1 tablet Oral Daily   pantoprazole (PROTONIX) IV  40 mg Intravenous Q6H   thiamine  100 mg Oral Daily   Or   thiamine  100 mg Intravenous Daily   Continuous:  Assessment/Plan: 1) Gastric ulcer (Forrest IIA) s/p BICAP. 2) LA Grade A esophagitis.   She is well clinically.  Her HGB is relatively stable.  Plan: 1) Continue with  pantoprazole. 2) Monitor HGB and transfuse if necessary. 3) Advance to a regular diet. 4) Probable D/C home tomorrow.  LOS: 2 days   Aleathea Pugmire D 08/25/2023, 8:21 AM

## 2023-08-26 DIAGNOSIS — K922 Gastrointestinal hemorrhage, unspecified: Secondary | ICD-10-CM | POA: Diagnosis not present

## 2023-08-26 LAB — BASIC METABOLIC PANEL
Anion gap: 7 (ref 5–15)
BUN: 9 mg/dL (ref 6–20)
CO2: 25 mmol/L (ref 22–32)
Calcium: 8.9 mg/dL (ref 8.9–10.3)
Chloride: 101 mmol/L (ref 98–111)
Creatinine, Ser: 0.61 mg/dL (ref 0.44–1.00)
GFR, Estimated: >60 mL/min (ref >60)
Glucose, Bld: 109 mg/dL — ABNORMAL HIGH (ref 70–99)
Potassium: 3.8 mmol/L (ref 3.5–5.1)
Sodium: 133 mmol/L — ABNORMAL LOW (ref 135–145)

## 2023-08-26 LAB — CBC
HCT: 28.7 % — ABNORMAL LOW (ref 36.0–46.0)
Hemoglobin: 10 g/dL — ABNORMAL LOW (ref 12.0–15.0)
MCH: 35.8 pg — ABNORMAL HIGH (ref 26.0–34.0)
MCHC: 34.8 g/dL (ref 30.0–36.0)
MCV: 102.9 fL — ABNORMAL HIGH (ref 80.0–100.0)
Platelets: 164 10*3/uL (ref 150–400)
RBC: 2.79 MIL/uL — ABNORMAL LOW (ref 3.87–5.11)
RDW: 12.9 % (ref 11.5–15.5)
WBC: 5.9 10*3/uL (ref 4.0–10.5)
nRBC: 0 % (ref 0.0–0.2)

## 2023-08-26 LAB — MAGNESIUM: Magnesium: 2 mg/dL (ref 1.7–2.4)

## 2023-08-26 LAB — SURGICAL PATHOLOGY

## 2023-08-26 MED ORDER — CALCIUM CARBONATE ANTACID 750 MG PO CHEW: 2.0000 | CHEWABLE_TABLET | Freq: Every day | ORAL | Status: DC | PRN

## 2023-08-26 MED ORDER — ONDANSETRON HCL 4 MG PO TABS: 4.0000 mg | ORAL_TABLET | Freq: Four times a day (QID) | ORAL | 0 refills | Status: DC | PRN

## 2023-08-26 MED ORDER — PANTOPRAZOLE SODIUM 40 MG PO TBEC: 40.0000 mg | DELAYED_RELEASE_TABLET | Freq: Two times a day (BID) | ORAL | 1 refills | Status: DC

## 2023-08-26 MED ORDER — VITAMIN B-1 100 MG PO TABS: 100.0000 mg | ORAL_TABLET | Freq: Every day | ORAL | 0 refills | Status: DC

## 2023-08-26 MED ORDER — ADULT MULTIVITAMIN W/MINERALS CH: 1.0000 | ORAL_TABLET | Freq: Every day | ORAL | Status: DC

## 2023-08-26 MED ORDER — FOLIC ACID 1 MG PO TABS: 1.0000 mg | ORAL_TABLET | Freq: Every day | ORAL | 0 refills | Status: DC

## 2023-08-26 NOTE — TOC Transition Note (Addendum)
Transition of Care [facility]) - CM/SW Discharge Note   Patient Details  Name: Melanie Moreno MRN: [id number] Date of Birth: August 02, 1976  [facility] CM/SW Contact:  Kermit Balo, RN Phone Number: 08/26/2023, 11:27 AM   Clinical Narrative:     Pt is discharging home with self care. PCP appt obtained and placed on AVS by prior CM.  Inpatient/ outpt resources for alcohol counseling placed on her AVS.  Pt has transportation home.   Patient Goals and CMS Choice      Discharge Placement                         Discharge Plan and Services Additional resources added to the After Visit Summary for     Discharge Planning Services: CM Consult Post Acute Care Choice: NA            DME Agency: NA       HH Arranged: NA          Social Determinants of Health (SDOH) Interventions SDOH Screenings   Food Insecurity: Food Insecurity Present (08/23/2023)  Housing: Low Risk  (08/23/2023)  Transportation Needs: No Transportation Needs (08/23/2023)  Utilities: Not At Risk (08/23/2023)  Tobacco Use: High Risk (08/23/2023)     Readmission Risk Interventions     No data to display

## 2023-08-26 NOTE — Plan of Care (Signed)

## 2023-08-26 NOTE — Discharge Summary (Signed)
Physician Discharge Summary  Melanie Moreno WUJ:[id number] DOB: [birth year] DOA: 08/23/2023  PCP: Pcp, No  Admit date: 08/23/2023 Discharge date: 08/26/2023  Admitted From: Home Disposition: Home  Recommendations for Outpatient Follow-up:  Follow up with PCP in 1 week with repeat CBC/BMP Outpatient follow-up with GI Follow up in ED if symptoms worsen or new appear   Home Health: No Equipment/Devices: None  Discharge Condition: Stable CODE STATUS: Full Diet recommendation: Regular Brief/Interim Summary: 47 year old with history of HTN, alcohol use, tobacco use presented with abdominal pain and hematemesis.  CT of abdomen and pelvis showed gastric wall thickening.  She was started on IV Protonix.  She underwent EGD which showed nonbleeding esophagitis and nonbleeding gastric ulcers.  Subsequently, her condition has improved.  She is tolerating diet.  GI has cleared the patient for discharge on oral Protonix twice daily.  She will be discharged home today with outpatient follow-up with PCP and GI.  Discharge Diagnoses:   Possible upper GI bleeding presenting with hematemesis Acute blood loss anemia Macrocytosis Nonbleeding esophagitis and nonbleeding gastric ulcers -GI following.  Treated with IV Protonix. underwent EGD which showed nonbleeding esophagitis and nonbleeding gastric ulcers. --Hemoglobin stable.  -She is tolerating diet.  GI has cleared the patient for discharge on oral Protonix twice daily.  She will be discharged home today with outpatient follow-up with PCP and GI.   History of alcohol use -Continue CIWA protocol.  She needs to abstain from alcohol.  Continue thiamine, multivitamin and folic acid on discharge   Essential hypertension -Blood pressure currently on the lower side.  Hold metoprolol till reevaluation by PCP.  Hyponatremia -Mild.  Encourage oral intake.  Outpatient follow-up   Discharge Instructions   Allergies as of 08/26/2023       Reactions    Dilaudid [hydromorphone Hcl] Other (See Comments)   Severe headache, generalized redness        Medication List     STOP taking these medications    ibuprofen 200 MG tablet Commonly known as: ADVIL   metoprolol tartrate 50 MG tablet Commonly known as: LOPRESSOR       TAKE these medications    calcium carbonate 750 MG chewable tablet Commonly known as: TUMS EX Chew 2 tablets (1,500 mg total) by mouth daily as needed for heartburn.   folic acid 1 MG tablet Commonly known as: FOLVITE Take 1 tablet (1 mg total) by mouth daily.   multivitamin with minerals Tabs tablet Take 1 tablet by mouth daily.   ondansetron 4 MG tablet Commonly known as: ZOFRAN Take 1 tablet (4 mg total) by mouth every 6 (six) hours as needed for nausea.   pantoprazole 40 MG tablet Commonly known as: Protonix Take 1 tablet (40 mg total) by mouth 2 (two) times daily before a meal.   thiamine 100 MG tablet Commonly known as: Vitamin B-1 Take 1 tablet (100 mg total) by mouth daily.        Follow-up Information     Grayce Sessions, NP Follow up.   Specialty: Internal Medicine Why: September 06, 2023 at 0930 Contact information: [address] [place] [address] [phone]                Allergies  Allergen Reactions   Dilaudid [Hydromorphone Hcl] Other (See Comments)    Severe headache, generalized redness    Consultations: GI   Procedures/Studies: CT ABDOMEN PELVIS W CONTRAST  Result Date: 08/23/2023 CLINICAL DATA:  Acute, nonlocalized abdominal pain EXAM: CT ABDOMEN AND PELVIS WITH  CONTRAST TECHNIQUE: Multidetector CT imaging of the abdomen and pelvis was performed using the standard protocol following bolus administration of intravenous contrast. RADIATION DOSE REDUCTION: This exam was performed according to the departmental dose-optimization program which includes automated exposure control, adjustment of the mA and/or kV according to patient size and/or use of  iterative reconstruction technique. CONTRAST:  75mL OMNIPAQUE IOHEXOL 350 MG/ML SOLN COMPARISON:  05/20/2023 FINDINGS: Lower chest: Partial coverage of symmetrically inflated breast implants. Hepatobiliary: No focal liver abnormality.No evidence of biliary obstruction or stone. Pancreas: Unremarkable. Spleen: Unremarkable. Adrenals/Urinary Tract: Negative adrenals. No hydronephrosis or stone. Unremarkable bladder. Stomach/Bowel: Circumferential low-density thickening of the distal stomach, chronic since at least 2021. No discrete ulceration and no perforation. No evidence of small bowel or colonic inflammation. Appendectomy. Vascular/Lymphatic: No acute vascular abnormality. Low-density atheromatous wall thickening of the aorta. No mass or adenopathy. Reproductive:No pathologic findings. Other: No ascites or pneumoperitoneum. Musculoskeletal: No acute abnormalities. IMPRESSION: 1. No acute finding when compared to prior. 2. Chronic distal gastric wall thickening 3. Aortic atherosclerosis. Electronically Signed   By: Tiburcio Pea M.D.   On: 08/23/2023 04:56   CT Soft Tissue Neck W Contrast  Result Date: 08/07/2023 CLINICAL DATA:  Swelling and pain beneath tongue on the right, concern for sialadenitis. EXAM: CT NECK WITH CONTRAST TECHNIQUE: Multidetector CT imaging of the neck was performed using the standard protocol following the bolus administration of intravenous contrast. RADIATION DOSE REDUCTION: This exam was performed according to the departmental dose-optimization program which includes automated exposure control, adjustment of the mA and/or kV according to patient size and/or use of iterative reconstruction technique. CONTRAST:  80mL OMNIPAQUE IOHEXOL 300 MG/ML  SOLN COMPARISON:  None Available. FINDINGS: Pharynx and larynx: The nasal cavity and nasopharynx are unremarkable. The oral cavity and oropharynx are unremarkable. The parapharyngeal spaces are clear. The hypopharynx and larynx are  unremarkable. The vocal folds are normal in appearance. The epiglottis is normal. There is no retropharyngeal collection. The airway is patent. Salivary glands: The bilateral parotid and left submandibular glands are unremarkable. The right submandibular gland is swollen and hyperenhancing consistent with sialadenitis. There is surrounding inflammatory fat stranding and free fluid extending into the upper neck anteriorly but no evidence of abscess. No stone or ductal dilation is seen. Thyroid: Unremarkable Lymph nodes: There is no pathologic lymphadenopathy in the neck. Vascular: The major vasculature of the neck is unremarkable. Limited intracranial: Unremarkable. Visualized orbits: Unremarkable. Mastoids and visualized paranasal sinuses: Clear. Skeleton: There is no acute osseous abnormality or suspicious osseous lesion. Upper chest: The imaged lung apices are clear. Other: None. IMPRESSION: Findings above in keeping with right submandibular sialadenitis. No stone or ductal dilation is seen. Electronically Signed   By: Lesia Hausen M.D.   On: 08/07/2023 21:48      Subjective: Patient seen and examined at bedside.  No fever, vomiting, chest pain reported.  Feels okay to go home today. Discharge Exam: Vitals:   08/25/23 2046 08/26/23 0406  BP: 115/84 107/67  Pulse: 79 64  Resp: 16 14  Temp: 98.3 F (36.8 C) 97.6 F (36.4 C)  SpO2: 100% 99%    General: Pt is alert, awake, not in acute distress.  On room air. Cardiovascular: rate controlled, S1/S2 + Respiratory: bilateral decreased breath sounds at bases Abdominal: Soft, NT, ND, bowel sounds + Extremities: no edema, no cyanosis    The results of significant diagnostics from this hospitalization (including imaging, microbiology, ancillary and laboratory) are listed below for reference.     Microbiology:  No results found for this or any previous visit (from the past 240 hour(s)).   Labs: BNP (last 3 results) No results for input(s):  "BNP" in the last 8760 hours. Basic Metabolic Panel: Recent Labs  Lab 08/23/23 0150 08/24/23 0751 08/25/23 0357 08/26/23 0256  NA 135 135 136 133*  K 3.6 4.0 4.0 3.8  CL 100 101 101 101  CO2 24 26 24 25   GLUCOSE 138* 90 92 109*  BUN 17 7 9 9   CREATININE 0.55 0.67 0.64 0.61  CALCIUM 8.7* 9.2 9.2 8.9  MG  --  1.8 1.9 2.0   Liver Function Tests: Recent Labs  Lab 08/23/23 0150  AST 23  ALT 18  ALKPHOS 42  BILITOT 0.7  PROT 5.9*  ALBUMIN 3.2*   Recent Labs  Lab 08/23/23 0150 08/23/23 1239  LIPASE 22 19   No results for input(s): "AMMONIA" in the last 168 hours. CBC: Recent Labs  Lab 08/23/23 1733 08/24/23 0751 08/24/23 1404 08/25/23 0357 08/26/23 0256  WBC 6.9 6.1 7.5 5.7 5.9  HGB 10.9* 11.1* 11.7* 11.2* 10.0*  HCT 31.8* 32.4* 33.5* 31.6* 28.7*  MCV 102.9* 103.8* 102.1* 102.6* 102.9*  PLT 190 174 202 181 164   Cardiac Enzymes: No results for input(s): "CKTOTAL", "CKMB", "CKMBINDEX", "TROPONINI" in the last 168 hours. BNP: Invalid input(s): "POCBNP" CBG: Recent Labs  Lab 08/23/23 0148  GLUCAP 160*   D-Dimer No results for input(s): "DDIMER" in the last 72 hours. Hgb A1c No results for input(s): "HGBA1C" in the last 72 hours. Lipid Profile No results for input(s): "CHOL", "HDL", "LDLCALC", "TRIG", "CHOLHDL", "LDLDIRECT" in the last 72 hours. Thyroid function studies No results for input(s): "TSH", "T4TOTAL", "T3FREE", "THYROIDAB" in the last 72 hours.  Invalid input(s): "FREET3" Anemia work up No results for input(s): "VITAMINB12", "FOLATE", "FERRITIN", "TIBC", "IRON", "RETICCTPCT" in the last 72 hours. Urinalysis    Component Value Date/Time   COLORURINE YELLOW 08/23/2023 0218   APPEARANCEUR HAZY (A) 08/23/2023 0218   LABSPEC 1.020 08/23/2023 0218   PHURINE 7.0 08/23/2023 0218   GLUCOSEU NEGATIVE 08/23/2023 0218   HGBUR NEGATIVE 08/23/2023 0218   BILIRUBINUR NEGATIVE 08/23/2023 0218   KETONESUR 5 (A) 08/23/2023 0218   PROTEINUR NEGATIVE  08/23/2023 0218   UROBILINOGEN 0.2 01/19/2015 1808   NITRITE NEGATIVE 08/23/2023 0218   LEUKOCYTESUR TRACE (A) 08/23/2023 0218   Sepsis Labs Recent Labs  Lab 08/24/23 0751 08/24/23 1404 08/25/23 0357 08/26/23 0256  WBC 6.1 7.5 5.7 5.9   Microbiology No results found for this or any previous visit (from the past 240 hour(s)).   Time coordinating discharge: 35 minutes  SIGNED:   Glade Lloyd, MD  [facility] 08/26/2023, 8:17 AM

## 2023-08-26 NOTE — Plan of Care (Signed)
  Problem: Clinical Measurements: Goal: Ability to maintain clinical measurements within normal limits will improve Outcome: Progressing   

## 2023-08-26 NOTE — Progress Notes (Signed)
Mobility Specialist Progress Note:   08/26/23 1200  Mobility  Activity Ambulated with assistance in hallway  Level of Assistance Standby assist, set-up cues, supervision of patient - no hands on  Assistive Device Other (Comment) (hallway rails)  Distance Ambulated (ft) 250 ft  Activity Response Tolerated well  Mobility Referral Yes  $Mobility charge 1 Mobility  Mobility Specialist Start Time (ACUTE ONLY) 1200  Mobility Specialist Stop Time (ACUTE ONLY) 1215  Mobility Specialist Time Calculation (min) (ACUTE ONLY) 15 min   Pt agreeable to mobility session. Required only supervision for safety. Pt used hallway handrails, c/o 7/10 abdominal pain with occasional sharp shooting pains. Pt back in bed with all needs met, eager to eat.  Addison Lank Mobility Specialist Please contact via SecureChat or  Rehab office at [phone]

## 2023-08-28 ENCOUNTER — Other Ambulatory Visit: Payer: Self-pay

## 2023-08-28 ENCOUNTER — Encounter (HOSPITAL_COMMUNITY): Payer: Self-pay | Admitting: Internal Medicine

## 2023-08-28 ENCOUNTER — Encounter: Payer: Self-pay | Admitting: Internal Medicine

## 2023-08-28 DIAGNOSIS — K250 Acute gastric ulcer with hemorrhage: Secondary | ICD-10-CM

## 2023-09-05 ENCOUNTER — Telehealth (INDEPENDENT_AMBULATORY_CARE_PROVIDER_SITE_OTHER): Payer: Self-pay | Admitting: Primary Care

## 2023-09-05 NOTE — Telephone Encounter (Signed)
Called to remind pt about upcoming apt. VM left

## 2023-09-06 ENCOUNTER — Encounter (INDEPENDENT_AMBULATORY_CARE_PROVIDER_SITE_OTHER): Payer: Self-pay | Admitting: Primary Care

## 2023-09-06 ENCOUNTER — Ambulatory Visit (INDEPENDENT_AMBULATORY_CARE_PROVIDER_SITE_OTHER): Payer: Medicaid Other | Admitting: Primary Care

## 2023-09-06 VITALS — BP 147/93 | HR 61 | Resp 16 | Ht 64.0 in | Wt 118.0 lb

## 2023-09-06 DIAGNOSIS — K250 Acute gastric ulcer with hemorrhage: Secondary | ICD-10-CM | POA: Diagnosis not present

## 2023-09-06 DIAGNOSIS — Z09 Encounter for follow-up examination after completed treatment for conditions other than malignant neoplasm: Secondary | ICD-10-CM | POA: Diagnosis not present

## 2023-09-06 DIAGNOSIS — I10 Essential (primary) hypertension: Secondary | ICD-10-CM | POA: Diagnosis not present

## 2023-09-06 DIAGNOSIS — Z2821 Immunization not carried out because of patient refusal: Secondary | ICD-10-CM | POA: Diagnosis not present

## 2023-09-06 NOTE — Progress Notes (Signed)
New Patient Office Visit  Subjective    Patient ID: Melanie Moreno, female    DOB: 1976-05-22  Age: 47 y.o. MRN: [id number]  CC:  Chief Complaint  Patient presents with   Hospitalization Follow-up     Melanie Moreno presents to establish care. Hospital f/u will be done by GI.  Hypertension This is a chronic problem. The current episode started more than 1 year ago. The problem has been gradually worsening since onset. Associated symptoms include anxiety, headaches, palpitations and shortness of breath. Risk factors for coronary artery disease include smoking/tobacco exposure. Past treatments include beta blockers. The current treatment provides no improvement. There are no compliance problems.    Increasing metoprolol tartrate 50mg  BID RTN for BP ck  Outpatient Encounter Medications as of 09/06/2023  Medication Sig   metoprolol tartrate (LOPRESSOR) 50 MG tablet Take 50 mg by mouth 2 (two) times daily.   calcium carbonate (TUMS EX) 750 MG chewable tablet Chew 2 tablets (1,500 mg total) by mouth daily as needed for heartburn.   folic acid (FOLVITE) 1 MG tablet Take 1 tablet (1 mg total) by mouth daily.   Multiple Vitamin (MULTIVITAMIN WITH MINERALS) TABS tablet Take 1 tablet by mouth daily.   ondansetron (ZOFRAN) 4 MG tablet Take 1 tablet (4 mg total) by mouth every 6 (six) hours as needed for nausea.   pantoprazole (PROTONIX) 40 MG tablet Take 1 tablet (40 mg total) by mouth 2 (two) times daily before a meal.   thiamine (VITAMIN B-1) 100 MG tablet Take 1 tablet (100 mg total) by mouth daily.   No facility-administered encounter medications on file as of 09/06/2023.    Past Medical History:  Diagnosis Date   Anemia    Hypertension    Irregular heart rate    Ovarian cyst     Past Surgical History:  Procedure Laterality Date   APPENDECTOMY     BIOPSY  08/23/2023   Procedure: BIOPSY;  Surgeon: Beverley Fiedler, MD;  Location: [facility] ENDOSCOPY;  Service: Gastroenterology;;   BREAST  ENHANCEMENT SURGERY     ESOPHAGOGASTRODUODENOSCOPY (EGD) WITH PROPOFOL N/A 08/23/2023   Procedure: ESOPHAGOGASTRODUODENOSCOPY (EGD) WITH PROPOFOL;  Surgeon: Beverley Fiedler, MD;  Location: [facility] ENDOSCOPY;  Service: Gastroenterology;  Laterality: N/A;   HOT HEMOSTASIS N/A 08/23/2023   Procedure: HOT HEMOSTASIS (ARGON PLASMA COAGULATION/BICAP);  Surgeon: Beverley Fiedler, MD;  Location: [facility] ENDOSCOPY;  Service: Gastroenterology;  Laterality: N/A;   SCLEROTHERAPY  08/23/2023   Procedure: SCLEROTHERAPY;  Surgeon: Beverley Fiedler, MD;  Location: [facility] ENDOSCOPY;  Service: Gastroenterology;;    No family history on file.  Social History   Socioeconomic History   Marital status: Single    Spouse name: Not on file   Number of children: Not on file   Years of education: Not on file   Highest education level: Not on file  Occupational History   Not on file  Tobacco Use   Smoking status: Every Day    Current packs/day: 1.00    Types: Cigarettes   Smokeless tobacco: Never  Substance and Sexual Activity   Alcohol use: Not Currently    Comment: occassional   Drug use: Never   Sexual activity: Not on file  Other Topics Concern   Not on file  Social History Narrative   Not on file   Social Determinants of Health   Financial Resource Strain: Not on file  Food Insecurity: Food Insecurity Present (08/23/2023)   Hunger Vital Sign    Worried About  Running Out of Food in the Last Year: Often true    Ran Out of Food in the Last Year: Often true  Transportation Needs: No Transportation Needs (08/23/2023)   PRAPARE - Administrator, Civil Service (Medical): No    Lack of Transportation (Non-Medical): No  Physical Activity: Not on file  Stress: Not on file  Social Connections: Socially Isolated (09/06/2023)   Social Connection and Isolation Panel [NHANES]    Frequency of Communication with Friends and Family: More than three times a week    Frequency of Social Gatherings with Friends and Family:  Twice a week    Attends Religious Services: Never    Database administrator or Organizations: No    Attends Banker Meetings: Never    Marital Status: Divorced  Catering manager Violence: Not At Risk (08/23/2023)   Humiliation, Afraid, Rape, and Kick questionnaire    Fear of Current or Ex-Partner: No    Emotionally Abused: No    Physically Abused: No    Sexually Abused: No    Review of Systems  Respiratory:  Positive for shortness of breath.   Cardiovascular:  Positive for palpitations.  Neurological:  Positive for headaches.        Objective    BP (!) 147/93 (BP Location: Right Arm, Patient Position: Sitting, Cuff Size: Normal)   Pulse 61   Resp 16   Ht 5\' 4"  (1.626 m)   Wt 118 lb (53.5 kg)   SpO2 100%   BMI 20.25 kg/m   Physical Exam General: No apparent distress. Eyes: Extraocular eye movements intact, pupils equal and round. Neck: Supple, trachea midline. Thyroid: No enlargement, mobile without fixation, no tenderness. Cardiovascular: Regular rhythm and rate, no murmur, normal radial pulses. Respiratory: Normal respiratory effort, clear to auscultation. Gastrointestinal: Normal pitch active bowel sounds, nontender abdomen without distention or appreciable hepatomegaly.. Musculoskeletal: Normal muscle tone, no tenderness on palpation of tibia, no excessive thoracic kyphosis. Skin: Appropriate warmth, no visible rash. Mental status: Alert, conversant, speech clear, thought logical, appropriate mood and affect, no hallucinations or delusions evident. Hematologic/lymphatic: No cervical adenopathy, no visible ecchymoses.  Melanie Moreno was seen today for hospitalization follow-up.  Diagnoses and all orders for this visit:  Influenza vaccination declined  Acute gastric ulcer with hemorrhage -     Ambulatory referral to Gastroenterology   Hospital discharge follow-up  Recommendations for Outpatient Follow-up:  Follow up with PCP in 1 week with repeat  CBC/BMP Outpatient follow-up with GI Follow up in ED if symptoms worsen or new appear        Grayce Sessions, NP

## 2023-09-07 LAB — CMP14+EGFR
ALT: 15 [IU]/L (ref 0–32)
AST: 26 [IU]/L (ref 0–40)
Albumin: 4.2 g/dL (ref 3.9–4.9)
Alkaline Phosphatase: 65 [IU]/L (ref 44–121)
BUN/Creatinine Ratio: 15 (ref 9–23)
BUN: 8 mg/dL (ref 6–24)
Bilirubin Total: 0.4 mg/dL (ref 0.0–1.2)
CO2: 23 mmol/L (ref 20–29)
Calcium: 9.4 mg/dL (ref 8.7–10.2)
Chloride: 101 mmol/L (ref 96–106)
Creatinine, Ser: 0.53 mg/dL — ABNORMAL LOW (ref 0.57–1.00)
Globulin, Total: 2.2 g/dL (ref 1.5–4.5)
Glucose: 80 mg/dL (ref 70–99)
Potassium: 4.3 mmol/L (ref 3.5–5.2)
Sodium: 138 mmol/L (ref 134–144)
Total Protein: 6.4 g/dL (ref 6.0–8.5)
eGFR: 115 mL/min/{1.73_m2} (ref >59)

## 2023-09-07 LAB — CBC WITH DIFFERENTIAL/PLATELET
Basophils Absolute: 0.1 10*3/uL (ref 0.0–0.2)
Basos: 1 %
EOS (ABSOLUTE): 0.1 10*3/uL (ref 0.0–0.4)
Eos: 1 %
Hematocrit: 33 % — ABNORMAL LOW (ref 34.0–46.6)
Hemoglobin: 11.1 g/dL (ref 11.1–15.9)
Immature Grans (Abs): 0 10*3/uL (ref 0.0–0.1)
Immature Granulocytes: 0 %
Lymphocytes Absolute: 1.6 10*3/uL (ref 0.7–3.1)
Lymphs: 21 %
MCH: 34.9 pg — ABNORMAL HIGH (ref 26.6–33.0)
MCHC: 33.6 g/dL (ref 31.5–35.7)
MCV: 104 fL — ABNORMAL HIGH (ref 79–97)
Monocytes Absolute: 0.5 10*3/uL (ref 0.1–0.9)
Monocytes: 6 %
Neutrophils Absolute: 5.7 10*3/uL (ref 1.4–7.0)
Neutrophils: 71 %
Platelets: 419 10*3/uL (ref 150–450)
RBC: 3.18 x10E6/uL — ABNORMAL LOW (ref 3.77–5.28)
RDW: 13.2 % (ref 11.7–15.4)
WBC: 8 10*3/uL (ref 3.4–10.8)

## 2023-09-20 ENCOUNTER — Inpatient Hospital Stay (HOSPITAL_COMMUNITY)
Admission: EM | Admit: 2023-09-20 | Discharge: 2023-09-23 | DRG: 440 | Disposition: A | Discharge status: Home / Self Care | Payer: Medicaid Other | Attending: Internal Medicine | Admitting: Internal Medicine

## 2023-09-20 ENCOUNTER — Other Ambulatory Visit: Payer: Self-pay

## 2023-09-20 DIAGNOSIS — F1020 Alcohol dependence, uncomplicated: Secondary | ICD-10-CM | POA: Diagnosis present

## 2023-09-20 DIAGNOSIS — K209 Esophagitis, unspecified without bleeding: Secondary | ICD-10-CM | POA: Diagnosis present

## 2023-09-20 DIAGNOSIS — Z885 Allergy status to narcotic agent status: Secondary | ICD-10-CM

## 2023-09-20 DIAGNOSIS — R824 Acetonuria: Secondary | ICD-10-CM | POA: Diagnosis present

## 2023-09-20 DIAGNOSIS — K2970 Gastritis, unspecified, without bleeding: Secondary | ICD-10-CM | POA: Diagnosis present

## 2023-09-20 DIAGNOSIS — Z79899 Other long term (current) drug therapy: Secondary | ICD-10-CM

## 2023-09-20 DIAGNOSIS — K257 Chronic gastric ulcer without hemorrhage or perforation: Secondary | ICD-10-CM | POA: Diagnosis present

## 2023-09-20 DIAGNOSIS — F17210 Nicotine dependence, cigarettes, uncomplicated: Secondary | ICD-10-CM | POA: Diagnosis present

## 2023-09-20 DIAGNOSIS — K852 Alcohol induced acute pancreatitis without necrosis or infection: Principal | ICD-10-CM | POA: Diagnosis present

## 2023-09-20 DIAGNOSIS — K2950 Unspecified chronic gastritis without bleeding: Secondary | ICD-10-CM

## 2023-09-20 DIAGNOSIS — I10 Essential (primary) hypertension: Secondary | ICD-10-CM | POA: Diagnosis present

## 2023-09-20 DIAGNOSIS — Z716 Tobacco abuse counseling: Secondary | ICD-10-CM

## 2023-09-20 LAB — CBC WITH DIFFERENTIAL/PLATELET
Abs Immature Granulocytes: 0.02 10*3/uL (ref 0.00–0.07)
Basophils Absolute: 0 10*3/uL (ref 0.0–0.1)
Basophils Relative: 0 %
Eosinophils Absolute: 0 10*3/uL (ref 0.0–0.5)
Eosinophils Relative: 1 %
HCT: 32.1 % — ABNORMAL LOW (ref 36.0–46.0)
Hemoglobin: 10.5 g/dL — ABNORMAL LOW (ref 12.0–15.0)
Immature Granulocytes: 0 %
Lymphocytes Relative: 26 %
Lymphs Abs: 2 10*3/uL (ref 0.7–4.0)
MCH: 33.4 pg (ref 26.0–34.0)
MCHC: 32.7 g/dL (ref 30.0–36.0)
MCV: 102.2 fL — ABNORMAL HIGH (ref 80.0–100.0)
Monocytes Absolute: 0.8 10*3/uL (ref 0.1–1.0)
Monocytes Relative: 10 %
Neutro Abs: 4.8 10*3/uL (ref 1.7–7.7)
Neutrophils Relative %: 63 %
Platelets: 195 10*3/uL (ref 150–400)
RBC: 3.14 MIL/uL — ABNORMAL LOW (ref 3.87–5.11)
RDW: 13.2 % (ref 11.5–15.5)
WBC: 7.6 10*3/uL (ref 4.0–10.5)
nRBC: 0 % (ref 0.0–0.2)

## 2023-09-20 LAB — URINALYSIS, ROUTINE W REFLEX MICROSCOPIC
Bilirubin Urine: NEGATIVE
Glucose, UA: NEGATIVE mg/dL
Hgb urine dipstick: NEGATIVE
Ketones, ur: 20 mg/dL — AB
Leukocytes,Ua: NEGATIVE
Nitrite: NEGATIVE
Protein, ur: NEGATIVE mg/dL
Specific Gravity, Urine: 1.023 (ref 1.005–1.030)
pH: 5 (ref 5.0–8.0)

## 2023-09-20 LAB — COMPREHENSIVE METABOLIC PANEL
ALT: 14 U/L (ref 0–44)
AST: 24 U/L (ref 15–41)
Albumin: 3.9 g/dL (ref 3.5–5.0)
Alkaline Phosphatase: 46 U/L (ref 38–126)
Anion gap: 12 (ref 5–15)
BUN: 10 mg/dL (ref 6–20)
CO2: 22 mmol/L (ref 22–32)
Calcium: 9.9 mg/dL (ref 8.9–10.3)
Chloride: 99 mmol/L (ref 98–111)
Creatinine, Ser: 0.58 mg/dL (ref 0.44–1.00)
GFR, Estimated: >60 mL/min (ref >60)
Glucose, Bld: 92 mg/dL (ref 70–99)
Potassium: 4.2 mmol/L (ref 3.5–5.1)
Sodium: 133 mmol/L — ABNORMAL LOW (ref 135–145)
Total Bilirubin: 0.9 mg/dL (ref <1.2)
Total Protein: 7 g/dL (ref 6.5–8.1)

## 2023-09-20 LAB — LIPASE, BLOOD: Lipase: 70 U/L — ABNORMAL HIGH (ref 11–51)

## 2023-09-20 LAB — HCG, SERUM, QUALITATIVE: Preg, Serum: NEGATIVE

## 2023-09-20 NOTE — ED Provider Triage Note (Signed)
Emergency Medicine Provider Triage Evaluation Note  Melanie Moreno , a 47 y.o. female  was evaluated in triage.  Pt complains of nausea and abdominal pain.  She was seen at the beginning of the month for GI bleed and had to have gastric ulcer cauterized during endoscopy.  Denies stool changes, melena, vomiting.  She states she has worsening pain whenever she eats or drinks anything.  Review of Systems  Positive: As above Negative: As above  Physical Exam  BP (!) 176/101 (BP Location: Right Arm)   Pulse 76   Temp 98.7 F (37.1 C)   Resp 16   SpO2 100%  Gen:   Awake, no distress   Resp:  Normal effort  MSK:   Moves extremities without difficulty  Other:    Medical Decision Making  Medically screening exam initiated at 9:10 PM.  Appropriate orders placed.  Yalina Peagler was informed that the remainder of the evaluation will be completed by another provider, this initial triage assessment does not replace that evaluation, and the importance of remaining in the ED until their evaluation is complete.     Lenard Simmer, PA-C 09/20/23 2112

## 2023-09-20 NOTE — ED Triage Notes (Signed)
Pt seen recently for endoscopy and had to have an ulcer repaired. She is now having the same pain. Denies bloody in stool. Does c/o nausea and burning.

## 2023-09-21 ENCOUNTER — Encounter (HOSPITAL_COMMUNITY): Payer: Self-pay | Admitting: Student

## 2023-09-21 ENCOUNTER — Emergency Department (HOSPITAL_COMMUNITY): Payer: Medicaid Other

## 2023-09-21 DIAGNOSIS — K209 Esophagitis, unspecified without bleeding: Secondary | ICD-10-CM | POA: Diagnosis present

## 2023-09-21 DIAGNOSIS — Z79899 Other long term (current) drug therapy: Secondary | ICD-10-CM | POA: Diagnosis not present

## 2023-09-21 DIAGNOSIS — Z885 Allergy status to narcotic agent status: Secondary | ICD-10-CM | POA: Diagnosis not present

## 2023-09-21 DIAGNOSIS — R1013 Epigastric pain: Secondary | ICD-10-CM | POA: Diagnosis present

## 2023-09-21 DIAGNOSIS — F17200 Nicotine dependence, unspecified, uncomplicated: Secondary | ICD-10-CM

## 2023-09-21 DIAGNOSIS — I10 Essential (primary) hypertension: Secondary | ICD-10-CM | POA: Diagnosis present

## 2023-09-21 DIAGNOSIS — R824 Acetonuria: Secondary | ICD-10-CM | POA: Diagnosis present

## 2023-09-21 DIAGNOSIS — K257 Chronic gastric ulcer without hemorrhage or perforation: Secondary | ICD-10-CM | POA: Diagnosis present

## 2023-09-21 DIAGNOSIS — F1020 Alcohol dependence, uncomplicated: Secondary | ICD-10-CM | POA: Diagnosis present

## 2023-09-21 DIAGNOSIS — Z716 Tobacco abuse counseling: Secondary | ICD-10-CM | POA: Diagnosis not present

## 2023-09-21 DIAGNOSIS — K2950 Unspecified chronic gastritis without bleeding: Secondary | ICD-10-CM

## 2023-09-21 DIAGNOSIS — F17210 Nicotine dependence, cigarettes, uncomplicated: Secondary | ICD-10-CM | POA: Diagnosis present

## 2023-09-21 DIAGNOSIS — K852 Alcohol induced acute pancreatitis without necrosis or infection: Secondary | ICD-10-CM | POA: Diagnosis present

## 2023-09-21 DIAGNOSIS — K2970 Gastritis, unspecified, without bleeding: Secondary | ICD-10-CM | POA: Diagnosis present

## 2023-09-21 LAB — HIV ANTIBODY (ROUTINE TESTING W REFLEX): HIV Screen 4th Generation wRfx: NONREACTIVE

## 2023-09-21 MED ORDER — SODIUM CHLORIDE 0.9 % IV SOLN: INTRAVENOUS | Status: AC

## 2023-09-21 MED ORDER — ACETAMINOPHEN 325 MG PO TABS: 650.0000 mg | ORAL_TABLET | Freq: Four times a day (QID) | ORAL | Status: DC | PRN

## 2023-09-21 MED ORDER — ACETAMINOPHEN 650 MG RE SUPP: 650.0000 mg | Freq: Four times a day (QID) | RECTAL | Status: DC | PRN

## 2023-09-21 MED ORDER — MORPHINE SULFATE (PF) 4 MG/ML IV SOLN
4.0000 mg | Freq: Once | INTRAVENOUS | Status: AC
  Administered 2023-09-21: 4 mg via INTRAVENOUS
  Filled 2023-09-21: qty 1

## 2023-09-21 MED ORDER — FOLIC ACID 1 MG PO TABS
1.0000 mg | ORAL_TABLET | Freq: Every day | ORAL | Status: DC
  Administered 2023-09-21 – 2023-09-23 (×3): 1 mg via ORAL
  Filled 2023-09-21 (×3): qty 1

## 2023-09-21 MED ORDER — ORAL CARE MOUTH RINSE: 15.0000 mL | OROMUCOSAL | Status: DC | PRN

## 2023-09-21 MED ORDER — ONDANSETRON HCL 4 MG/2ML IJ SOLN
4.0000 mg | Freq: Once | INTRAMUSCULAR | Status: AC
  Administered 2023-09-21: 4 mg via INTRAVENOUS
  Filled 2023-09-21: qty 2

## 2023-09-21 MED ORDER — ONDANSETRON HCL 4 MG/2ML IJ SOLN
4.0000 mg | Freq: Four times a day (QID) | INTRAMUSCULAR | Status: DC | PRN
  Administered 2023-09-21: 4 mg via INTRAVENOUS
  Filled 2023-09-21: qty 2

## 2023-09-21 MED ORDER — SODIUM CHLORIDE 0.9 % IV SOLN: Freq: Once | INTRAVENOUS | Status: AC

## 2023-09-21 MED ORDER — PANTOPRAZOLE SODIUM 40 MG IV SOLR
80.0000 mg | Freq: Once | INTRAVENOUS | Status: AC
Start: 2023-09-21 — End: 2023-09-21
  Administered 2023-09-21: 80 mg via INTRAVENOUS
  Filled 2023-09-21: qty 20

## 2023-09-21 MED ORDER — PANTOPRAZOLE SODIUM 40 MG PO TBEC
40.0000 mg | DELAYED_RELEASE_TABLET | Freq: Two times a day (BID) | ORAL | Status: DC
  Administered 2023-09-21 – 2023-09-23 (×5): 40 mg via ORAL
  Filled 2023-09-21 (×5): qty 1

## 2023-09-21 MED ORDER — LORAZEPAM 2 MG/ML IJ SOLN: 1.0000 mg | INTRAMUSCULAR | Status: DC | PRN

## 2023-09-21 MED ORDER — CALCIUM CARBONATE ANTACID 500 MG PO CHEW: 1.0000 | CHEWABLE_TABLET | Freq: Three times a day (TID) | ORAL | Status: DC | PRN

## 2023-09-21 MED ORDER — THIAMINE MONONITRATE 100 MG PO TABS
100.0000 mg | ORAL_TABLET | Freq: Every day | ORAL | Status: DC
  Administered 2023-09-21 – 2023-09-23 (×3): 100 mg via ORAL
  Filled 2023-09-21 (×3): qty 1

## 2023-09-21 MED ORDER — ADULT MULTIVITAMIN W/MINERALS CH
1.0000 | ORAL_TABLET | Freq: Every day | ORAL | Status: DC
  Administered 2023-09-21: 1 via ORAL
  Filled 2023-09-21 (×3): qty 1

## 2023-09-21 MED ORDER — ONDANSETRON HCL 4 MG PO TABS: 4.0000 mg | ORAL_TABLET | Freq: Four times a day (QID) | ORAL | Status: DC | PRN

## 2023-09-21 MED ORDER — OXYCODONE-ACETAMINOPHEN 5-325 MG PO TABS
1.0000 | ORAL_TABLET | Freq: Four times a day (QID) | ORAL | Status: DC | PRN
  Administered 2023-09-21 – 2023-09-23 (×8): 1 via ORAL
  Filled 2023-09-21 (×8): qty 1

## 2023-09-21 MED ORDER — ONDANSETRON HCL 4 MG/2ML IJ SOLN: 4.0000 mg | Freq: Four times a day (QID) | INTRAMUSCULAR | Status: DC | PRN

## 2023-09-21 MED ORDER — METOPROLOL TARTRATE 50 MG PO TABS
50.0000 mg | ORAL_TABLET | Freq: Two times a day (BID) | ORAL | Status: DC
  Administered 2023-09-21 – 2023-09-23 (×5): 50 mg via ORAL
  Filled 2023-09-21 (×5): qty 1

## 2023-09-21 MED ORDER — IOHEXOL 350 MG/ML SOLN
75.0000 mL | Freq: Once | INTRAVENOUS | Status: AC | PRN
  Administered 2023-09-21: 75 mL via INTRAVENOUS

## 2023-09-21 MED ORDER — ONDANSETRON HCL 4 MG/2ML IJ SOLN
4.0000 mg | Freq: Once | INTRAMUSCULAR | Status: AC
Start: 2023-09-21 — End: 2023-09-21
  Administered 2023-09-21: 4 mg via INTRAVENOUS
  Filled 2023-09-21: qty 2

## 2023-09-21 MED ORDER — SENNOSIDES-DOCUSATE SODIUM 8.6-50 MG PO TABS: 1.0000 | ORAL_TABLET | Freq: Every evening | ORAL | Status: DC | PRN

## 2023-09-21 MED ORDER — NICOTINE 14 MG/24HR TD PT24
14.0000 mg | MEDICATED_PATCH | Freq: Every day | TRANSDERMAL | Status: DC
  Administered 2023-09-21 – 2023-09-23 (×3): 14 mg via TRANSDERMAL
  Filled 2023-09-21 (×3): qty 1

## 2023-09-21 MED ORDER — THIAMINE HCL 100 MG/ML IJ SOLN
100.0000 mg | Freq: Every day | INTRAMUSCULAR | Status: DC
  Filled 2023-09-21: qty 2

## 2023-09-21 MED ORDER — ENOXAPARIN SODIUM 40 MG/0.4ML IJ SOSY
40.0000 mg | PREFILLED_SYRINGE | Freq: Every day | INTRAMUSCULAR | Status: DC
  Filled 2023-09-21: qty 0.4

## 2023-09-21 MED ORDER — MORPHINE SULFATE (PF) 2 MG/ML IV SOLN
1.0000 mg | INTRAVENOUS | Status: DC | PRN
  Administered 2023-09-21 – 2023-09-23 (×7): 1 mg via INTRAVENOUS
  Filled 2023-09-21 (×7): qty 1

## 2023-09-21 MED ORDER — MORPHINE SULFATE (PF) 2 MG/ML IV SOLN
1.0000 mg | INTRAVENOUS | Status: DC | PRN
  Administered 2023-09-21: 1 mg via INTRAVENOUS
  Filled 2023-09-21: qty 1

## 2023-09-21 MED ORDER — LORAZEPAM 1 MG PO TABS: 1.0000 mg | ORAL_TABLET | ORAL | Status: DC | PRN

## 2023-09-21 NOTE — ED Notes (Signed)
ED TO INPATIENT HANDOFF REPORT  ED Nurse Name and Phone #: Joice Lofts [phone]  S Name/Age/Gender Melanie Moreno 47 y.o. female Room/Bed: 031C/031C  Code Status   Code Status: Full Code  Home/SNF/Other Home Patient oriented to: self, place, time, and situation Is this baseline? Yes   Triage Complete: Triage complete  Chief Complaint Alcohol-induced pancreatitis [K85.20]  Triage Note Pt seen recently for endoscopy and had to have an ulcer repaired. She is now having the same pain. Denies bloody in stool. Does c/o nausea and burning.   Allergies Allergies  Allergen Reactions   Dilaudid [Hydromorphone Hcl] Other (See Comments)    Severe headache, generalized redness    Level of Care/Admitting Diagnosis ED Disposition     ED Disposition  Admit   Condition  --   Comment  Hospital Area: [facility] [[id number]]  Level of Care: Med-Surg [16]  May place patient in observation at [facility] or [facility] if equivalent level of care is available:: No  Covid Evaluation: Asymptomatic - no recent exposure (last 10 days) testing not required  Diagnosis: Alcohol-induced pancreatitis [332951]  Admitting Physician: Steffanie Rainwater [[id number]]  Attending Physician: Steffanie Rainwater [[id number]]          B Medical/Surgery History Past Medical History:  Diagnosis Date   Anemia    Hypertension    Irregular heart rate    Ovarian cyst    Past Surgical History:  Procedure Laterality Date   APPENDECTOMY     BIOPSY  08/23/2023   Procedure: BIOPSY;  Surgeon: Beverley Fiedler, MD;  Location: [facility] ENDOSCOPY;  Service: Gastroenterology;;   BREAST ENHANCEMENT SURGERY     ESOPHAGOGASTRODUODENOSCOPY (EGD) WITH PROPOFOL N/A 08/23/2023   Procedure: ESOPHAGOGASTRODUODENOSCOPY (EGD) WITH PROPOFOL;  Surgeon: Beverley Fiedler, MD;  Location: [facility] ENDOSCOPY;  Service: Gastroenterology;  Laterality: N/A;   HOT HEMOSTASIS N/A 08/23/2023   Procedure: HOT HEMOSTASIS (ARGON PLASMA  COAGULATION/BICAP);  Surgeon: Beverley Fiedler, MD;  Location: [facility] ENDOSCOPY;  Service: Gastroenterology;  Laterality: N/A;   SCLEROTHERAPY  08/23/2023   Procedure: SCLEROTHERAPY;  Surgeon: Beverley Fiedler, MD;  Location: [facility] ENDOSCOPY;  Service: Gastroenterology;;     A IV Location/Drains/Wounds Patient Lines/Drains/Airways Status     Active Line/Drains/Airways     Name Placement date Placement time Site Days   Peripheral IV 09/21/23 20 G Right Antecubital 09/21/23  0310  Antecubital  less than 1            Intake/Output Last 24 hours  Intake/Output Summary (Last 24 hours) at 09/21/2023 0810 Last data filed at 09/21/2023 0659 Gross per 24 hour  Intake 124.01 ml  Output --  Net 124.01 ml    Labs/Imaging Results for orders placed or performed during the hospital encounter of 09/20/23 (from the past 48 hour(s))  CBC with Differential     Status: Abnormal   Collection Time: 09/20/23  9:20 PM  Result Value Ref Range   WBC 7.6 4.0 - 10.5 K/uL   RBC 3.14 (L) 3.87 - 5.11 MIL/uL   Hemoglobin 10.5 (L) 12.0 - 15.0 g/dL   HCT 09.3 (L) 23.5 - 57.3 %   MCV 102.2 (H) 80.0 - 100.0 fL   MCH 33.4 26.0 - 34.0 pg   MCHC 32.7 30.0 - 36.0 g/dL   RDW 22.0 25.4 - 27.0 %   Platelets 195 150 - 400 K/uL   nRBC 0.0 0.0 - 0.2 %   Neutrophils Relative % 63 %   Neutro Abs 4.8 1.7 -  7.7 K/uL   Lymphocytes Relative 26 %   Lymphs Abs 2.0 0.7 - 4.0 K/uL   Monocytes Relative 10 %   Monocytes Absolute 0.8 0.1 - 1.0 K/uL   Eosinophils Relative 1 %   Eosinophils Absolute 0.0 0.0 - 0.5 K/uL   Basophils Relative 0 %   Basophils Absolute 0.0 0.0 - 0.1 K/uL   Immature Granulocytes 0 %   Abs Immature Granulocytes 0.02 0.00 - 0.07 K/uL    Comment: Performed at [facility], [address] [place], [place] [address]  Comprehensive metabolic panel     Status: Abnormal   Collection Time: 09/20/23  9:20 PM  Result Value Ref Range   Sodium 133 (L) 135 - 145 mmol/L   Potassium 4.2 3.5 - 5.1 mmol/L    Chloride 99 98 - 111 mmol/L   CO2 22 22 - 32 mmol/L   Glucose, Bld 92 70 - 99 mg/dL    Comment: Glucose reference range applies only to samples taken after fasting for at least 8 hours.   BUN 10 6 - 20 mg/dL   Creatinine, Ser 9.52 0.44 - 1.00 mg/dL   Calcium 9.9 8.9 - 84.1 mg/dL   Total Protein 7.0 6.5 - 8.1 g/dL   Albumin 3.9 3.5 - 5.0 g/dL   AST 24 15 - 41 U/L   ALT 14 0 - 44 U/L   Alkaline Phosphatase 46 38 - 126 U/L   Total Bilirubin 0.9 <1.2 mg/dL   GFR, Estimated >32 >44 mL/min    Comment: (NOTE) Calculated using the CKD-EPI Creatinine Equation (2021)    Anion gap 12 5 - 15    Comment: Performed at [facility], [address] [place], [place] [address]  Lipase, blood     Status: Abnormal   Collection Time: 09/20/23  9:20 PM  Result Value Ref Range   Lipase 70 (H) 11 - 51 U/L    Comment: Performed at [facility], [address], [place], [place] [address]  hCG, serum, qualitative     Status: None   Collection Time: 09/20/23  9:20 PM  Result Value Ref Range   Preg, Serum NEGATIVE NEGATIVE    Comment:        THE SENSITIVITY OF THIS METHODOLOGY IS >10 mIU/mL. Performed at [facility], [address], [place], [place] [address]   Urinalysis, Routine w reflex microscopic -Urine, Clean Catch     Status: Abnormal   Collection Time: 09/20/23  9:23 PM  Result Value Ref Range   Color, Urine YELLOW YELLOW   APPearance HAZY (A) CLEAR   Specific Gravity, Urine 1.023 1.005 - 1.030   pH 5.0 5.0 - 8.0   Glucose, UA NEGATIVE NEGATIVE mg/dL   Hgb urine dipstick NEGATIVE NEGATIVE   Bilirubin Urine NEGATIVE NEGATIVE   Ketones, ur 20 (A) NEGATIVE mg/dL   Protein, ur NEGATIVE NEGATIVE mg/dL   Nitrite NEGATIVE NEGATIVE   Leukocytes,Ua NEGATIVE NEGATIVE    Comment: Performed at [facility], [address], [place], [place] [address]   CT ABDOMEN PELVIS W CONTRAST  Result Date: 09/21/2023 CLINICAL DATA:  Abdominal pain. EXAM: CT ABDOMEN AND PELVIS WITH  CONTRAST TECHNIQUE: Multidetector CT imaging of the abdomen and pelvis was performed using the standard protocol following bolus administration of intravenous contrast. RADIATION DOSE REDUCTION: This exam was performed according to the departmental dose-optimization program which includes automated exposure control, adjustment of the mA and/or kV according to patient size  and/or use of iterative reconstruction technique. CONTRAST:  75mL OMNIPAQUE IOHEXOL 350 MG/ML SOLN COMPARISON:  Multiple prior CTs back to 2015. The 2 most recent are both with contrast and dated 08/23/2023 and 05/20/2023. FINDINGS: Lower chest: Chronic linear scarring laterally in the right lower lobe. Lung bases are clear of infiltrates. The cardiac size is normal. Bilateral breast implants are partially visible. Hepatobiliary: No focal liver abnormality is seen. No gallstones, gallbladder wall thickening, or biliary dilatation. The liver is mildly steatotic as before. Pancreas: There is stranding of the head and neck of the pancreas. Minimal free fluid is seen in the pancreatico duodenal groove extending up into the porta hepatis. Findings could be due to acute pancreatitis or could be reactive changes related to thickening in the antrum pyloric stomach is also seen. There is no pancreatic mass enhancement or ductal dilatation. Spleen: Normal. Adrenals/Urinary Tract: Adrenal glands are unremarkable. Kidneys are normal, without renal calculi, focal lesion, or hydronephrosis Bladder is unremarkable for the degree of distention. There is symmetric renal contrast excretion in the delayed phase. Stomach/Bowel: Moderate circumferential antropyloric gastric wall thickening is again noted with mucosal enhancement, but has been present at least as far back as a CT of abdomen and pelvis from 05/17/2018 it seems unchanged. The small bowel is normal in caliber. Surgically absent appendix again noted. Scattered colonic diverticulosis without diverticulitis  or focal colitis. Vascular/Lymphatic: Scattered aortic wall thrombus, minimal calcific plaque right common iliac artery. No AAA. No new vascular findings. Multiple pelvic phleboliths. There is no adenopathy. Reproductive: The uterus is intact. There is a 2.3 cm dominant follicle of the right ovary, Hounsfield density is 21. No follow-up imaging is recommended. Left adnexal structures are unremarkable. Other: There is minimal free fluid in the pelvic cul-de-sac, probably physiologic, alternatively could be due to leakage of the right ovarian follicle. There is no free air or free hemorrhage. Musculoskeletal: No acute or significant findings. IMPRESSION: 1. Stranding at the head and neck of pancreas with minimal free fluid in the pancreaticoduodenal groove extending up into the porta hepatis. Findings could be due to acute pancreatitis or could be reactive changes related to thickening in the antropyloric stomach. Laboratory correlation recommended. 2. Moderate circumferential antropyloric gastric wall thickening with mucosal enhancement, but has been present at least as far back as a CT of abdomen and pelvis from 05/17/2018 and it seems unchanged. 3. 2.3 cm dominant follicle of the right ovary. 4. Minimal free fluid in the pelvic cul-de-sac, probably physiologic, alternatively could be due to leakage of the right ovarian follicle. 5. Aortic and minimal right common iliac atherosclerosis. 6. Diverticulosis without evidence of diverticulitis. 7. Mild hepatic steatosis. Aortic Atherosclerosis (ICD10-I70.0). Electronically Signed   By: Almira Bar M.D.   On: 09/21/2023 05:05    Pending Labs Unresulted Labs (From admission, onward)     Start     Ordered   09/21/23 0756  HIV Antibody (routine testing w rflx)  (HIV Antibody (Routine testing w reflex) panel)  Once,   R        09/21/23 0759            Vitals/Pain Today's Vitals   09/21/23 0656 09/21/23 0739 09/21/23 0739 09/21/23 0747  BP:   (!) 164/69    Pulse:   (!) 55   Resp:      Temp:    97.7 F (36.5 C)  TempSrc:    Oral  SpO2:      Weight:      Height:  PainSc: 5  8       Isolation Precautions No active isolations  Medications Medications  0.9 %  sodium chloride infusion ( Intravenous New Bag/Given 09/21/23 0657)  LORazepam (ATIVAN) tablet 1-4 mg (has no administration in time range)    Or  LORazepam (ATIVAN) injection 1-4 mg (has no administration in time range)  thiamine (VITAMIN B1) tablet 100 mg (100 mg Oral Given 09/21/23 0656)    Or  thiamine (VITAMIN B1) injection 100 mg ( Intravenous See Alternative 09/21/23 0656)  folic acid (FOLVITE) tablet 1 mg (has no administration in time range)  multivitamin with minerals tablet 1 tablet (has no administration in time range)  pantoprazole (PROTONIX) EC tablet 40 mg (40 mg Oral Given 09/21/23 0748)  morphine (PF) 2 MG/ML injection 1 mg (1 mg Intravenous Given 09/21/23 0740)  nicotine (NICODERM CQ - dosed in mg/24 hours) patch 14 mg (14 mg Transdermal Patch Applied 09/21/23 0656)  enoxaparin (LOVENOX) injection 40 mg (has no administration in time range)  acetaminophen (TYLENOL) tablet 650 mg (has no administration in time range)    Or  acetaminophen (TYLENOL) suppository 650 mg (has no administration in time range)  senna-docusate (Senokot-S) tablet 1 tablet (has no administration in time range)  ondansetron (ZOFRAN) tablet 4 mg (has no administration in time range)    Or  ondansetron (ZOFRAN) injection 4 mg (has no administration in time range)  metoprolol tartrate (LOPRESSOR) tablet 50 mg (has no administration in time range)  calcium carbonate (TUMS - dosed in mg elemental calcium) chewable tablet 200 mg of elemental calcium (has no administration in time range)  morphine (PF) 4 MG/ML injection 4 mg (4 mg Intravenous Given 09/21/23 0313)  ondansetron (ZOFRAN) injection 4 mg (4 mg Intravenous Given 09/21/23 0313)  pantoprazole (PROTONIX) injection 80 mg (80 mg  Intravenous Given 09/21/23 0315)  iohexol (OMNIPAQUE) 350 MG/ML injection 75 mL (75 mLs Intravenous Contrast Given 09/21/23 0415)  0.9 %  sodium chloride infusion (0 mLs Intravenous Stopped 09/21/23 0659)  morphine (PF) 4 MG/ML injection 4 mg (4 mg Intravenous Given 09/21/23 0541)  ondansetron (ZOFRAN) injection 4 mg (4 mg Intravenous Given 09/21/23 0541)    Mobility walks     Focused Assessments GI    R Recommendations: See Admitting Provider Note  Report given to:   Additional Notes:

## 2023-09-21 NOTE — Plan of Care (Signed)

## 2023-09-21 NOTE — H&P (Signed)
History and Physical    Patient: Melanie Moreno YQM:[id number] DOB: 1975/11/04 DOA: 09/20/2023 DOS: the patient was seen and examined on 09/21/2023 PCP: Grayce Sessions, NP  Patient coming from: Home  Chief Complaint:  Chief Complaint  Patient presents with   Abdominal Pain   HPI: Melanie Moreno is a 47 y.o. female with medical history significant of HTN, anemia, alcohol use disorder, tobacco use disorder and recently diagnosed gastric ulcer who presented for evaluation of abdominal pain.  Patient states after Thanksgiving dinner on Thursday, she started having abdominal pain described as twisting in her epigastrium.  She reports associated nausea chills and feeling gassy but denied any vomiting, fevers or bloody stools.  Yesterday she had worsening of her epigastric pain which led her to present to the ED for evaluation.  States she has cut down her drinking from 1 pint of Christiane Ha to 1/2 of a pint and this amount last her for about a week.  Her last EtOH use was Thursday afternoon.  She has had decreased p.o. intake over the last few days due to the nausea and abdominal pain.  ED course: Hypertensive with SBP in the 160s to 170s but otherwise normal vitals. Labs show WBC 7.6, Hgb 10.5, platelet 195, sodium 133, K+ 4.2, creatinine 0.58, AST/ALT 24/14, alk phos 46, bilirubin 0.9, lipase 70, negative hCG, UA with mild ketonuria but no signs of infection. CT A/P shows stranding at the head and neck of pancreas with minimal free fluid in the pancreaticoduodenal groove extending up into the porta hepatis which could be due to acute pancreatitis or reactive changes related to thickening in the antropyloric stomach.  Patient received IV fluid, IV Zofran and morphine in the ED [facility] consulted for admission  Review of Systems: As mentioned in the history of present illness. All other systems reviewed and are negative. Past Medical History:  Diagnosis Date   Anemia    Hypertension    Irregular  heart rate    Ovarian cyst    Past Surgical History:  Procedure Laterality Date   APPENDECTOMY     BIOPSY  08/23/2023   Procedure: BIOPSY;  Surgeon: Beverley Fiedler, MD;  Location: [facility] ENDOSCOPY;  Service: Gastroenterology;;   BREAST ENHANCEMENT SURGERY     ESOPHAGOGASTRODUODENOSCOPY (EGD) WITH PROPOFOL N/A 08/23/2023   Procedure: ESOPHAGOGASTRODUODENOSCOPY (EGD) WITH PROPOFOL;  Surgeon: Beverley Fiedler, MD;  Location: [facility] ENDOSCOPY;  Service: Gastroenterology;  Laterality: N/A;   HOT HEMOSTASIS N/A 08/23/2023   Procedure: HOT HEMOSTASIS (ARGON PLASMA COAGULATION/BICAP);  Surgeon: Beverley Fiedler, MD;  Location: [facility] ENDOSCOPY;  Service: Gastroenterology;  Laterality: N/A;   SCLEROTHERAPY  08/23/2023   Procedure: SCLEROTHERAPY;  Surgeon: Beverley Fiedler, MD;  Location: [facility] ENDOSCOPY;  Service: Gastroenterology;;   Social History:  reports that she has been smoking cigarettes. She has never used smokeless tobacco. She reports that she does not currently use alcohol. She reports that she does not use drugs.  Allergies  Allergen Reactions   Dilaudid [Hydromorphone Hcl] Other (See Comments)    Severe headache, generalized redness    History reviewed. No pertinent family history.  Prior to Admission medications   Medication Sig Start Date End Date Taking? Authorizing Provider  acetaminophen (TYLENOL) 500 MG tablet Take 1,000 mg by mouth as needed.   Yes [provider]  calcium carbonate (TUMS EX) 750 MG chewable tablet Chew 2 tablets (1,500 mg total) by mouth daily as needed for heartburn. 08/26/23  Yes Glade Lloyd, MD  folic acid (  FOLVITE) 1 MG tablet Take 1 tablet (1 mg total) by mouth daily. 08/26/23  Yes Glade Lloyd, MD  metoprolol tartrate (LOPRESSOR) 50 MG tablet Take 50 mg by mouth 2 (two) times daily. 08/29/23  Yes [provider]  ondansetron (ZOFRAN) 4 MG tablet Take 1 tablet (4 mg total) by mouth every 6 (six) hours as needed for nausea. 08/26/23  Yes Glade Lloyd, MD   pantoprazole (PROTONIX) 40 MG tablet Take 1 tablet (40 mg total) by mouth 2 (two) times daily before a meal. 08/26/23 10/25/23 Yes Glade Lloyd, MD  thiamine (VITAMIN B-1) 100 MG tablet Take 1 tablet (100 mg total) by mouth daily. 08/26/23  Yes Glade Lloyd, MD  Multiple Vitamin (MULTIVITAMIN WITH MINERALS) TABS tablet Take 1 tablet by mouth daily. Patient not taking: Reported on 09/21/2023 08/26/23   Glade Lloyd, MD    Physical Exam: Vitals:   09/21/23 0747 09/21/23 0800 09/21/23 0951 09/21/23 1008  BP:  (!) 156/83 (!) 165/114 (!) 165/114  Pulse:  69 66 66  Resp:  11 16   Temp: 97.7 F (36.5 C)  97.9 F (36.6 C)   TempSrc: Oral  Oral   SpO2:  100% 100%   Weight:      Height:       General: Pleasant, well-appearing middle-age woman laying in bed. No acute distress. HEENT: [place]/AT. Anicteric sclera.  Dry mucous membrane. CV: RRR. No murmurs, rubs, or gallops. No LE edema Pulmonary: Lungs CTAB. Normal effort. No wheezing or rales. Abdominal: Soft, nondistended.  Mild epigastric tenderness. Normal bowel sounds. Extremities: Palpable radial and DP pulses. Normal ROM. Skin: Warm and dry. No obvious rash or lesions. Neuro: A&Ox3. Moves all extremities. Normal sensation to light touch. No focal deficit. Psych: Normal mood and affect  Data Reviewed:  Labs show WBC 7.6, Hgb 10.5, platelet 195, sodium 133, K+ 4.2, creatinine 0.58, AST/ALT 24/14, alk phos 46, bilirubin 0.9, lipase 70, negative hCG, UA with mild ketonuria but no signs of infection. CT A/P shows stranding at the head and neck of pancreas with minimal free fluid in the pancreaticoduodenal groove extending up into the porta hepatis which could be due to acute pancreatitis or reactive changes related to thickening in the antropyloric stomach.   Assessment and Plan: Melanie Moreno is a 47 y.o. female with medical history significant of HTN, anemia, alcohol use disorder, tobacco use disorder and recently diagnosed gastric ulcer  who presented for evaluation of abdominal pain and admitted for alcohol induced pancreatitis.  # Alcohol induced pancreatitis Patient with significant alcohol use disorder presenting with 2 days of epigastric pain with associated nausea and decreased p.o. intake. CT shows evidence of possible acute pancreatitis. Lipase only minimally elevated to 70. Presentation likely early pancreatitis compounded by her ongoing gastritis. -Continue IV NS at 125 cc/h -Pain control with prn IV morphine and Percocet -Advance diet as tolerated  #Hx of Gastritis #Hx of nonbleeding Gastric ulcer Patient had a recent hospitalization 1 month ago for hematemesis and EGD showed nonbleeding esophagitis and nonbleeding gastric ulcers. She was discharged home on oral Protonix twice daily which patient reports taking daily. She denies any hematemesis or bloody stools. Her epigastric pain likely a component of worsening gastritis with her continue alcohol use. S/p IV Protonix 80 mg x 1 in the ED. -Continue Protonix 40 mg twice daily -As needed Tums for heartburn  # HTN BP elevated with SBP in the 150s to 170s in the setting of her acute epigastric pain. -Resume home metoprolol 50  mg daily  # Alcohol use disorder Reports decreasing her EtOH intake from 1 pint of Christiane Ha to 1/2 pint last her about a week. Last EtOH use was 2 days ago. She denies any EtOH withdrawal symptoms.  CIWA score of 0 this morning. -Continue on CIWA with Ativan -MVI, thiamine and folate  # Tobacco use disorder Reports significant tobacco use history since the age of 25. She has cut down to about 1/2 ppd of cigarettes on the day she works, and 1 ppd when at home. She has attempted to quit in the past with Wellbutrin and Chantix but was unable to tolerate the side effects of mood changes. She is interested in nicotine patch while inpatient. -Nicotine patch 14 mg every 24 hours.  Smoking cessation counseling for 4 minutes today  I have  discussed tobacco cessation with the patient.  I have counseled the patient regarding the negative impacts of continued tobacco use including but not limited to lung cancer, COPD, and cardiovascular disease.  I have discussed alternatives to tobacco and modalities that may help facilitate tobacco cessation including but not limited to biofeedback, hypnosis, and medications.  Total time spent with tobacco counseling was 4 minutes.    Advance Care Planning:   Code Status: Full Code   Consults: None  Family Communication: No family at bedside  Severity of Illness: The appropriate patient status for this patient is OBSERVATION. Observation status is judged to be reasonable and necessary in order to provide the required intensity of service to ensure the patient's safety. The patient's presenting symptoms, physical exam findings, and initial radiographic and laboratory data in the context of their medical condition is felt to place them at decreased risk for further clinical deterioration. Furthermore, it is anticipated that the patient will be medically stable for discharge from the hospital within 2 midnights of admission.   Author: Steffanie Rainwater, MD 09/21/2023 10:28 AM  For on call review [email or web address].

## 2023-09-21 NOTE — ED Provider Notes (Signed)
[facility] EMERGENCY DEPARTMENT AT [facility] Provider Note   CSN: [id number] Arrival date & time: 09/20/23  2103     History  Chief Complaint  Patient presents with   Abdominal Pain    Melanie Moreno is a 47 y.o. female.  HPI     This is a 47 year old female who presents with concern for abdominal pain.  Reports recent history of peptic ulcers.  Reports she is compliant with her medications at home.  She does continue to drink alcohol but states that she has decreased alcohol intake significantly.  Over the last 24 hours she reports increasing epigastric pain.  It started after eating Thanksgiving dinner.  She has had nausea without vomiting.  No recurrent hematemesis or bloody stools.  Home Medications Prior to Admission medications   Medication Sig Start Date End Date Taking? Authorizing Provider  calcium carbonate (TUMS EX) 750 MG chewable tablet Chew 2 tablets (1,500 mg total) by mouth daily as needed for heartburn. 08/26/23   Glade Lloyd, MD  folic acid (FOLVITE) 1 MG tablet Take 1 tablet (1 mg total) by mouth daily. 08/26/23   Glade Lloyd, MD  metoprolol tartrate (LOPRESSOR) 50 MG tablet Take 50 mg by mouth 2 (two) times daily. 08/29/23   [provider]  Multiple Vitamin (MULTIVITAMIN WITH MINERALS) TABS tablet Take 1 tablet by mouth daily. 08/26/23   Glade Lloyd, MD  ondansetron (ZOFRAN) 4 MG tablet Take 1 tablet (4 mg total) by mouth every 6 (six) hours as needed for nausea. 08/26/23   Glade Lloyd, MD  pantoprazole (PROTONIX) 40 MG tablet Take 1 tablet (40 mg total) by mouth 2 (two) times daily before a meal. 08/26/23 10/25/23  Glade Lloyd, MD  thiamine (VITAMIN B-1) 100 MG tablet Take 1 tablet (100 mg total) by mouth daily. 08/26/23   Glade Lloyd, MD      Allergies    Dilaudid [hydromorphone hcl]    Review of Systems   Review of Systems  Constitutional:  Negative for fever.  Gastrointestinal:  Positive for abdominal pain and nausea.  Negative for blood in stool and vomiting.  All other systems reviewed and are negative.   Physical Exam Updated Vital Signs BP (!) 172/89   Pulse 64   Temp 98.7 F (37.1 C) (Oral)   Resp 13   Ht 1.626 m (5\' 4" )   Wt 53.5 kg   SpO2 99%   BMI 20.25 kg/m  Physical Exam Vitals and nursing note reviewed.  Constitutional:      Appearance: She is well-developed. She is not ill-appearing.  HENT:     Head: Normocephalic and atraumatic.  Eyes:     Pupils: Pupils are equal, round, and reactive to light.  Cardiovascular:     Rate and Rhythm: Normal rate and regular rhythm.     Heart sounds: Normal heart sounds.  Pulmonary:     Effort: Pulmonary effort is normal. No respiratory distress.     Breath sounds: No wheezing.  Abdominal:     General: Bowel sounds are normal.     Palpations: Abdomen is soft.     Tenderness: There is abdominal tenderness in the epigastric area. There is no guarding or rebound.  Musculoskeletal:     Cervical back: Neck supple.  Skin:    General: Skin is warm and dry.  Neurological:     Mental Status: She is alert and oriented to person, place, and time.  Psychiatric:        Mood and Affect:  Mood normal.     ED Results / Procedures / Treatments   Labs (all labs ordered are listed, but only abnormal results are displayed) Labs Reviewed  CBC WITH DIFFERENTIAL/PLATELET - Abnormal; Notable for the following components:      Result Value   RBC 3.14 (*)    Hemoglobin 10.5 (*)    HCT 32.1 (*)    MCV 102.2 (*)    All other components within normal limits  COMPREHENSIVE METABOLIC PANEL - Abnormal; Notable for the following components:   Sodium 133 (*)    All other components within normal limits  LIPASE, BLOOD - Abnormal; Notable for the following components:   Lipase 70 (*)    All other components within normal limits  URINALYSIS, ROUTINE W REFLEX MICROSCOPIC - Abnormal; Notable for the following components:   APPearance HAZY (*)    Ketones, ur 20 (*)     All other components within normal limits  HCG, SERUM, QUALITATIVE    EKG None  Radiology CT ABDOMEN PELVIS W CONTRAST  Result Date: 09/21/2023 CLINICAL DATA:  Abdominal pain. EXAM: CT ABDOMEN AND PELVIS WITH CONTRAST TECHNIQUE: Multidetector CT imaging of the abdomen and pelvis was performed using the standard protocol following bolus administration of intravenous contrast. RADIATION DOSE REDUCTION: This exam was performed according to the departmental dose-optimization program which includes automated exposure control, adjustment of the mA and/or kV according to patient size and/or use of iterative reconstruction technique. CONTRAST:  75mL OMNIPAQUE IOHEXOL 350 MG/ML SOLN COMPARISON:  Multiple prior CTs back to 2015. The 2 most recent are both with contrast and dated 08/23/2023 and 05/20/2023. FINDINGS: Lower chest: Chronic linear scarring laterally in the right lower lobe. Lung bases are clear of infiltrates. The cardiac size is normal. Bilateral breast implants are partially visible. Hepatobiliary: No focal liver abnormality is seen. No gallstones, gallbladder wall thickening, or biliary dilatation. The liver is mildly steatotic as before. Pancreas: There is stranding of the head and neck of the pancreas. Minimal free fluid is seen in the pancreatico duodenal groove extending up into the porta hepatis. Findings could be due to acute pancreatitis or could be reactive changes related to thickening in the antrum pyloric stomach is also seen. There is no pancreatic mass enhancement or ductal dilatation. Spleen: Normal. Adrenals/Urinary Tract: Adrenal glands are unremarkable. Kidneys are normal, without renal calculi, focal lesion, or hydronephrosis Bladder is unremarkable for the degree of distention. There is symmetric renal contrast excretion in the delayed phase. Stomach/Bowel: Moderate circumferential antropyloric gastric wall thickening is again noted with mucosal enhancement, but has been  present at least as far back as a CT of abdomen and pelvis from 05/17/2018 it seems unchanged. The small bowel is normal in caliber. Surgically absent appendix again noted. Scattered colonic diverticulosis without diverticulitis or focal colitis. Vascular/Lymphatic: Scattered aortic wall thrombus, minimal calcific plaque right common iliac artery. No AAA. No new vascular findings. Multiple pelvic phleboliths. There is no adenopathy. Reproductive: The uterus is intact. There is a 2.3 cm dominant follicle of the right ovary, Hounsfield density is 21. No follow-up imaging is recommended. Left adnexal structures are unremarkable. Other: There is minimal free fluid in the pelvic cul-de-sac, probably physiologic, alternatively could be due to leakage of the right ovarian follicle. There is no free air or free hemorrhage. Musculoskeletal: No acute or significant findings. IMPRESSION: 1. Stranding at the head and neck of pancreas with minimal free fluid in the pancreaticoduodenal groove extending up into the porta hepatis. Findings could be due  to acute pancreatitis or could be reactive changes related to thickening in the antropyloric stomach. Laboratory correlation recommended. 2. Moderate circumferential antropyloric gastric wall thickening with mucosal enhancement, but has been present at least as far back as a CT of abdomen and pelvis from 05/17/2018 and it seems unchanged. 3. 2.3 cm dominant follicle of the right ovary. 4. Minimal free fluid in the pelvic cul-de-sac, probably physiologic, alternatively could be due to leakage of the right ovarian follicle. 5. Aortic and minimal right common iliac atherosclerosis. 6. Diverticulosis without evidence of diverticulitis. 7. Mild hepatic steatosis. Aortic Atherosclerosis (ICD10-I70.0). Electronically Signed   By: Almira Bar M.D.   On: 09/21/2023 05:05    Procedures Procedures    Medications Ordered in ED Medications  0.9 %  sodium chloride infusion (has no  administration in time range)  morphine (PF) 4 MG/ML injection 4 mg (has no administration in time range)  ondansetron (ZOFRAN) injection 4 mg (has no administration in time range)  morphine (PF) 4 MG/ML injection 4 mg (4 mg Intravenous Given 09/21/23 0313)  ondansetron (ZOFRAN) injection 4 mg (4 mg Intravenous Given 09/21/23 0313)  pantoprazole (PROTONIX) injection 80 mg (80 mg Intravenous Given 09/21/23 0315)  iohexol (OMNIPAQUE) 350 MG/ML injection 75 mL (75 mLs Intravenous Contrast Given 09/21/23 0415)    ED Course/ Medical Decision Making/ A&P Clinical Course as of 09/21/23 0529  Sat Sep 21, 2023  1610 Patient reports ongoing pain.  Will plan for admission for fluids and pain control. [CH]    Clinical Course User Index [CH] Lawerence Dery, Mayer Masker, MD                                 Medical Decision Making Amount and/or Complexity of Data Reviewed Radiology: ordered.  Risk Prescription drug management. Decision regarding hospitalization.   This patient presents to the ED for concern of abdominal pain, this involves an extensive number of treatment options, and is a complaint that carries with it a high risk of complications and morbidity.  I considered the following differential and admission for this acute, potentially life threatening condition.  The differential diagnosis includes ongoing gastritis, peptic ulcer disease, perforation, pancreatitis, cholecystitis  MDM:    This is a 47 year old female who presents with epigastric pain.  She is nontoxic-appearing.  She appears hypertensive.  She does have epigastric tenderness to palpation without signs of peritonitis.  She does continue to drink alcohol which would likely continue to exacerbate gastritis/peptic ulcer.  Patient also has a history of pancreatitis.  Labs obtained.  Only notable for a lipase of 70.  No significant metabolic derangements.  CT obtained.  CT shows stranding at the pancreatic head.  She also has some  thickening of the gastric wall.  Likely multifactorial.  Patient continues to endorse pain despite pain control provided.  Will plan for admission for fluids and pain management for likely pancreatitis and ongoing gastritis.  Patient was given Protonix.  (Labs, imaging, consults)  Labs: I Ordered, and personally interpreted labs.  The pertinent results include: BC, CMP, lipase  Imaging Studies ordered: I ordered imaging studies including CT abdomen pelvis I independently visualized and interpreted imaging. I agree with the radiologist interpretation  Additional history obtained from chart review.  External records from outside source obtained and reviewed including prior evaluations and recent hospitalization  Cardiac Monitoring: The patient was has maintained on a cardiac monitor.  If on the cardiac monitor, I  personally viewed and interpreted the cardiac monitored which showed an underlying rhythm of: Sinus  Reevaluation: After the interventions noted above, I reevaluated the patient and found that they have :stayed the same  Social Determinants of Health:  lives independently  Disposition: Admit  Co morbidities that complicate the patient evaluation  Past Medical History:  Diagnosis Date   Anemia    Hypertension    Irregular heart rate    Ovarian cyst      Medicines Meds ordered this encounter  Medications   morphine (PF) 4 MG/ML injection 4 mg   ondansetron (ZOFRAN) injection 4 mg   pantoprazole (PROTONIX) injection 80 mg   iohexol (OMNIPAQUE) 350 MG/ML injection 75 mL   0.9 %  sodium chloride infusion   morphine (PF) 4 MG/ML injection 4 mg   ondansetron (ZOFRAN) injection 4 mg    I have reviewed the patients home medicines and have made adjustments as needed  Problem List / ED Course: Problem List Items Addressed This Visit       Digestive   Acute alcoholic pancreatitis - Primary   Relevant Medications   morphine (PF) 4 MG/ML injection 4 mg (Start on  09/21/2023  5:30 AM)                Final Clinical Impression(s) / ED Diagnoses Final diagnoses:  Alcohol-induced acute pancreatitis, unspecified complication status    Rx / DC Orders ED Discharge Orders     None         Shon Baton, MD 09/21/23 [id number]

## 2023-09-22 DIAGNOSIS — K2950 Unspecified chronic gastritis without bleeding: Secondary | ICD-10-CM

## 2023-09-22 DIAGNOSIS — K852 Alcohol induced acute pancreatitis without necrosis or infection: Secondary | ICD-10-CM | POA: Diagnosis not present

## 2023-09-22 LAB — BASIC METABOLIC PANEL
Anion gap: 6 (ref 5–15)
BUN: <5 mg/dL — ABNORMAL LOW (ref 6–20)
CO2: 25 mmol/L (ref 22–32)
Calcium: 8.6 mg/dL — ABNORMAL LOW (ref 8.9–10.3)
Chloride: 105 mmol/L (ref 98–111)
Creatinine, Ser: 0.56 mg/dL (ref 0.44–1.00)
GFR, Estimated: >60 mL/min (ref >60)
Glucose, Bld: 96 mg/dL (ref 70–99)
Potassium: 3.5 mmol/L (ref 3.5–5.1)
Sodium: 136 mmol/L (ref 135–145)

## 2023-09-22 LAB — CBC
HCT: 30.3 % — ABNORMAL LOW (ref 36.0–46.0)
Hemoglobin: 9.8 g/dL — ABNORMAL LOW (ref 12.0–15.0)
MCH: 33.1 pg (ref 26.0–34.0)
MCHC: 32.3 g/dL (ref 30.0–36.0)
MCV: 102.4 fL — ABNORMAL HIGH (ref 80.0–100.0)
Platelets: 190 10*3/uL (ref 150–400)
RBC: 2.96 MIL/uL — ABNORMAL LOW (ref 3.87–5.11)
RDW: 13 % (ref 11.5–15.5)
WBC: 7.2 10*3/uL (ref 4.0–10.5)
nRBC: 0 % (ref 0.0–0.2)

## 2023-09-22 LAB — LIPASE, BLOOD: Lipase: 75 U/L — ABNORMAL HIGH (ref 11–51)

## 2023-09-22 MED ORDER — SODIUM CHLORIDE 0.9 % IV SOLN: INTRAVENOUS | Status: DC

## 2023-09-22 NOTE — Progress Notes (Signed)
PROGRESS NOTE  Melanie Moreno  DOB: December 24, 1975  PCP: Grayce Sessions, NP ZOX:[id number]  DOA: 09/20/2023  LOS: 1 day  Hospital Day: 3  Brief narrative: Melanie Moreno is a 47 y.o. female with PMH significant for chronic alcoholism, chronic smoking, hypertension, anemia, recently diagnosed gastric ulcer 11/21, patient presented to the ED with complaint of abdominal pain that gradually developed after Thanksgiving dinner.  Patient continues to drink but says she is cutting down.  Lately has been drinking half pint of JD a week.  Workup in the ED showed lipase level slightly elevated at 70, liver enzymes normal CT A/P showed stranding at the head and neck of pancreas with minimal free fluid in the pancreaticoduodenal groove extending up into the porta hepatis which could be due to acute pancreatitis or reactive changes related to thickening in the antropyloric stomach.   Patient received IV fluid, IV Zofran and morphine in the ED Admitted to [facility]  Subjective: Patient was seen and examined this morning.  Propped up in bed.  Not in distress. Still complains of epigastric pain but wants to advance diet. Chart reviewed Afebrile, heart rate in 60s, blood pressure 160s, breathing on room air. Tolerated advancement from clear liquid diet to full liquid diet for lunch today.  Wants to advanced to soft diet for evening.  Assessment and plan: Alcohol induced pancreatitis Patient with significant alcohol use disorder  Presented with abdominal pain, nausea, vomiting elevated lipase Currently on conservative management with IV fluid, IV antiemetic, IV analgesics Tolerated full liquid diet wants to advance to soft today Continue IV NS at 125 cc/h Pain control with prn IV morphine and Percocet Advance diet as tolerated Recent Labs  Lab 09/20/23 2120 09/22/23 0537  LIPASE 70* 75*    Hx of gastritis, nonbleeding Gastric ulcer Mild chronic anemia Patient had a recent hospitalization 1 month ago  for hematemesis and EGD showed nonbleeding esophagitis and nonbleeding gastric ulcers. She was discharged home on oral Protonix twice daily which patient reports taking daily.  At this time, she denies any hematemesis or bloody stools. Her epigastric pain likely a component of worsening gastritis with her continue alcohol use.  Continue Protonix 40 mg twice daily Continue as needed Tums for heartburn Hemoglobin down to lower than baseline.  Continue to monitor Recent Labs    08/25/23 0357 08/26/23 0256 09/06/23 1030 09/20/23 2120 09/22/23 0537  HGB 11.2* 10.0* 11.1 10.5* 9.8*  MCV 102.6* 102.9* 104* 102.2* 102.4*   HTN Continue metoprolol 50 mg daily Continue monitor blood pressure.  IV hydralazine as needed   Chronic alcoholism Reports decreasing her EtOH intake from 1 pint of Christiane Ha to 1/2 pint last her about a week. Last EtOH use was 2 days prior to presentation. At risk for alcohol withdrawal. Continue on CIWA with Ativan Continue MVI, thiamine and folate   Chronic daily smoker Reports significant tobacco use history since the age of 64. She has cut down to about 1/2 ppd of cigarettes on the day she works, and 1 ppd when at home. She has attempted to quit in the past with Wellbutrin and Chantix but was unable to tolerate the side effects of mood changes. She is interested in nicotine patch while inpatient. Nicotine patch 14 mg every 24 hours. Smoking cessation counseling done.     Mobility: Encourage ambulation  Goals of care   Code Status: Full Code     DVT prophylaxis:  enoxaparin (LOVENOX) injection 40 mg Start: 09/21/23 1000   Antimicrobials: None  Fluid: Continue normal saline at 125 mL/h for 24 more hours Consultants: None Family Communication: None at bedside  Status: Inpatient Level of care:  Med-Surg   Patient is from: Home Needs to continue in-hospital care: Needs IV fluid Anticipated d/c to: Home probably in 1 to 2 days   Diet:  Diet Order              DIET SOFT Room service appropriate? Yes; Fluid consistency: Thin  Diet effective 1400                   Scheduled Meds:  enoxaparin (LOVENOX) injection  40 mg Subcutaneous Daily   folic acid  1 mg Oral Daily   metoprolol tartrate  50 mg Oral BID   multivitamin with minerals  1 tablet Oral Daily   nicotine  14 mg Transdermal Daily   pantoprazole  40 mg Oral BID AC   thiamine  100 mg Oral Daily   Or   thiamine  100 mg Intravenous Daily    PRN meds: acetaminophen **OR** acetaminophen, calcium carbonate, LORazepam **OR** LORazepam, morphine injection, ondansetron **OR** ondansetron (ZOFRAN) IV, mouth rinse, oxyCODONE-acetaminophen, senna-docusate   Infusions:   sodium chloride      Antimicrobials: Anti-infectives (From admission, onward)    None       Objective: Vitals:   09/22/23 0720 09/22/23 0833  BP: (!) 160/93 (!) 160/93  Pulse: 66 66  Resp: 17   Temp: 98.1 F (36.7 C)   SpO2: 100%     Intake/Output Summary (Last 24 hours) at 09/22/2023 1517 Last data filed at 09/22/2023 0500 Gross per 24 hour  Intake 2730.88 ml  Output --  Net 2730.88 ml   Filed Weights   09/20/23 2111  Weight: 53.5 kg   Weight change:  Body mass index is 20.25 kg/m.   Physical Exam: General exam: Pleasant, young Caucasian female.  Looks older for stated age Skin: No rashes, lesions or ulcers. HEENT: Atraumatic, normocephalic, no obvious bleeding Lungs: Clear to auscultation bilaterally CVS: Regular rate and rhythm, no murmur GI/Abd soft, mild to moderate epigastric numbness, bowel sound present CNS: Alert, awake, oriented x 3 Psychiatry: Mood appropriate Extremities: No pedal edema, no calf tenderness  Data Review: I have personally reviewed the laboratory data and studies available.  F/u labs ordered Unresulted Labs (From admission, onward)     Start     Ordered   09/23/23 0500  Basic metabolic panel  Tomorrow morning,   R        09/22/23 1517   09/23/23  0500  CBC with Differential/Platelet  Tomorrow morning,   R        09/22/23 1517            Admission date and time: 09/20/2023  9:04 PM   Total time spent in review of labs and imaging, patient evaluation, formulation of plan, documentation and communication with family: 55 minutes  Signed, Lorin Glass, MD [facility] 09/22/2023

## 2023-09-22 NOTE — Plan of Care (Signed)

## 2023-09-23 DIAGNOSIS — K852 Alcohol induced acute pancreatitis without necrosis or infection: Secondary | ICD-10-CM | POA: Diagnosis not present

## 2023-09-23 LAB — BASIC METABOLIC PANEL
Anion gap: 7 (ref 5–15)
BUN: <5 mg/dL — ABNORMAL LOW (ref 6–20)
CO2: 22 mmol/L (ref 22–32)
Calcium: 8.7 mg/dL — ABNORMAL LOW (ref 8.9–10.3)
Chloride: 108 mmol/L (ref 98–111)
Creatinine, Ser: 0.52 mg/dL (ref 0.44–1.00)
GFR, Estimated: >60 mL/min (ref >60)
Glucose, Bld: 91 mg/dL (ref 70–99)
Potassium: 3.8 mmol/L (ref 3.5–5.1)
Sodium: 137 mmol/L (ref 135–145)

## 2023-09-23 LAB — CBC WITH DIFFERENTIAL/PLATELET
Abs Immature Granulocytes: 0.01 10*3/uL (ref 0.00–0.07)
Basophils Absolute: 0 10*3/uL (ref 0.0–0.1)
Basophils Relative: 1 %
Eosinophils Absolute: 0.1 10*3/uL (ref 0.0–0.5)
Eosinophils Relative: 2 %
HCT: 29.5 % — ABNORMAL LOW (ref 36.0–46.0)
Hemoglobin: 9.6 g/dL — ABNORMAL LOW (ref 12.0–15.0)
Immature Granulocytes: 0 %
Lymphocytes Relative: 48 %
Lymphs Abs: 2.2 10*3/uL (ref 0.7–4.0)
MCH: 33.2 pg (ref 26.0–34.0)
MCHC: 32.5 g/dL (ref 30.0–36.0)
MCV: 102.1 fL — ABNORMAL HIGH (ref 80.0–100.0)
Monocytes Absolute: 0.4 10*3/uL (ref 0.1–1.0)
Monocytes Relative: 10 %
Neutro Abs: 1.7 10*3/uL (ref 1.7–7.7)
Neutrophils Relative %: 39 %
Platelets: 189 10*3/uL (ref 150–400)
RBC: 2.89 MIL/uL — ABNORMAL LOW (ref 3.87–5.11)
RDW: 13.2 % (ref 11.5–15.5)
WBC: 4.4 10*3/uL (ref 4.0–10.5)
nRBC: 0 % (ref 0.0–0.2)

## 2023-09-23 LAB — LIPASE, BLOOD: Lipase: 31 U/L (ref 11–51)

## 2023-09-23 MED ORDER — OXYCODONE-ACETAMINOPHEN 5-325 MG PO TABS: 1.0000 | ORAL_TABLET | Freq: Four times a day (QID) | ORAL | 0 refills | Status: AC | PRN

## 2023-09-23 NOTE — Progress Notes (Signed)
Transition of Care [facility]) - Inpatient Brief Assessment   Patient Details  Name: Melanie Moreno MRN: [id number] Date of Birth: [birth year]  Transition of [facility]) CM/SW Contact:    Janae Bridgeman, RN Phone Number: 09/23/2023, 11:26 AM   Clinical Narrative: CM noted that patient admitted to hospital from home for alcohol-inducted pancreatitis.  Patient has current use of ETOH and smoking.  Substance abuse Outpatient counseling resources  and smoking cessation resources provided in the AVS.  No other TOC needs at this time.   Transition of Care Asessment: Insurance and Status: (P) Insurance coverage has been reviewed Patient has primary care physician: (P) Yes Home environment has been reviewed: (P) From home Prior level of function:: (P) Independent Prior/Current Home Services: (P) No current home services Social Determinants of Health Reivew: (P) SDOH reviewed interventions complete Readmission risk has been reviewed: (P) Yes Transition of care needs: (P) transition of care needs identified, TOC will continue to follow

## 2023-09-23 NOTE — Plan of Care (Signed)

## 2023-09-23 NOTE — Discharge Summary (Signed)
Physician Discharge Summary  Melanie Moreno NWG:[id number] DOB: 10-28-1975 DOA: 09/20/2023  PCP: Grayce Sessions, NP  Admit date: 09/20/2023 Discharge date: 09/23/2023  Admitted From: Home Discharge disposition: Home  Recommendations at discharge:  Stay on soft diet for next few days and gradually advance as tolerated Stop alcohol Cautious use of pain medicines  Brief narrative: Melanie Moreno is a 47 y.o. female with PMH significant for chronic alcoholism, chronic smoking, hypertension, anemia, recently diagnosed gastric ulcer 11/21, patient presented to the ED with complaint of abdominal pain that gradually developed after Thanksgiving dinner.  Patient continues to drink but says she is cutting down.  Lately has been drinking half pint of JD a week.  Workup in the ED showed lipase level slightly elevated at 70, liver enzymes normal CT A/P showed stranding at the head and neck of pancreas with minimal free fluid in the pancreaticoduodenal groove extending up into the porta hepatis which could be due to acute pancreatitis or reactive changes related to thickening in the antropyloric stomach.   Patient received IV fluid, IV Zofran and morphine in the ED Admitted to [facility]  Subjective: Patient was seen and examined this morning.  Lying on bed.  Not in distress.  Continues to have mild epigastric pain.  Able to tolerate soft diet.    Hospital course: Alcohol induced pancreatitis Patient with significant alcohol use disorder  Presented with abdominal pain, nausea, vomiting elevated lipase Currently on conservative management with IV fluid, IV antiemetic, IV analgesics Able to tolerate soft diet.  Lipase level back to normal.  Adequately hydrated. Continues to mild epigastric abdominal pain but gradually improving. Okay to discharge home today on as needed oxycodone for next few days. Recent Labs  Lab 09/20/23 2120 09/22/23 0537 09/23/23 0740  LIPASE 70* 75* 31    Hx of  gastritis, nonbleeding Gastric ulcer Mild chronic anemia Patient had a recent hospitalization 1 month ago for hematemesis and EGD showed nonbleeding esophagitis and nonbleeding gastric ulcers. She was discharged home on oral Protonix twice daily which patient reports taking daily.  At this time, she denies any hematemesis or bloody stools. Her epigastric pain likely a component of worsening gastritis with her continue alcohol use.  Continue Protonix 40 mg twice daily Continue as needed Tums for heartburn Hemoglobin down to lower than baseline. Recent Labs    08/26/23 0256 09/06/23 1030 09/20/23 2120 09/22/23 0537 09/23/23 0741  HGB 10.0* 11.1 10.5* 9.8* 9.6*  MCV 102.9* 104* 102.2* 102.4* 102.1*   HTN Blood pressure controlled with continuation of metoprolol 50 mg daily   Chronic alcoholism Reports decreasing her EtOH intake from 1 pint of Christiane Ha to 1/2 pint last her about a week. Last EtOH use was 2 days prior to presentation. Monitor with CIWA protocol.  No withdrawal symptoms in the hospital   Chronic daily smoker Counseled to quit.    Goals of care   Code Status: Full Code   .Wounds:  -    Discharge Exam:   Vitals:   09/22/23 2043 09/23/23 0110 09/23/23 0556 09/23/23 0819  BP: 126/87 (!) 154/96 (!) 164/89 (!) 154/89  Pulse: 71 71 64 61  Resp: 18 18 18    Temp: 98.6 F (37 C) 97.9 F (36.6 C) (!) 97.5 F (36.4 C) 97.9 F (36.6 C)  TempSrc: Oral Oral Oral Oral  SpO2: 100% 100% 100% 100%  Weight:      Height:        Body mass index is 20.25 kg/m.  General exam: Pleasant, middle-aged.  Not in distress Skin: No rashes, lesions or ulcers. HEENT: Atraumatic, normocephalic, no obvious bleeding Lungs: Clear to auscultation bilaterally CVS: Regular rate and rhythm, no murmur GI/Abd soft, mild epigastric tenderness, nondistended, bowel sound present CNS: Alert, awake, oriented x 3 Psychiatry: Mood appropriate Extremities: No pedal edema, no calf  tenderness  Follow ups:    Follow-up Information     Grayce Sessions, NP Follow up.   Specialty: Internal Medicine Contact information: [address] [place] [address] [phone]                 Discharge Instructions:   Discharge Instructions     Call MD for:  difficulty breathing, headache or visual disturbances   Complete by: As directed    Call MD for:  extreme fatigue   Complete by: As directed    Call MD for:  hives   Complete by: As directed    Call MD for:  persistant dizziness or light-headedness   Complete by: As directed    Call MD for:  persistant nausea and vomiting   Complete by: As directed    Call MD for:  severe uncontrolled pain   Complete by: As directed    Call MD for:  temperature >100.4   Complete by: As directed    Diet general   Complete by: As directed    Discharge instructions   Complete by: As directed    Recommendations at discharge:   Stay on soft diet for next few days and gradually advance as tolerated  Stop alcohol  Cautious use of pain medicines  General discharge instructions: Follow with Primary MD Grayce Sessions, NP in 7 days  Please request your PCP  to go over your hospital tests, procedures, radiology results at the follow up. Please get your medicines reviewed and adjusted.  Your PCP may decide to repeat certain labs or tests as needed. Do not drive, operate heavy machinery, perform activities at heights, swimming or participation in water activities or provide baby sitting services if your were admitted for syncope or siezures until you have seen by Primary MD or a Neurologist and advised to do so again. [place] Controlled Substance Reporting System database was reviewed. Do not drive, operate heavy machinery, perform activities at heights, swim, participate in water activities or provide baby-sitting services while on medications for pain, sleep and mood until your outpatient physician has  reevaluated you and advised to do so again.  You are strongly recommended to comply with the dose, frequency and duration of prescribed medications. Activity: As tolerated with Full fall precautions use walker/cane & assistance as needed Avoid using any recreational substances like cigarette, tobacco, alcohol, or non-prescribed drug. If you experience worsening of your admission symptoms, develop shortness of breath, life threatening emergency, suicidal or homicidal thoughts you must seek medical attention immediately by calling 911 or calling your MD immediately  if symptoms less severe. You must read complete instructions/literature along with all the possible adverse reactions/side effects for all the medicines you take and that have been prescribed to you. Take any new medicine only after you have completely understood and accepted all the possible adverse reactions/side effects.  Wear Seat belts while driving. You were cared for by a hospitalist during your hospital stay. If you have any questions about your discharge medications or the care you received while you were in the hospital after you are discharged, you can call the unit and ask to speak  with the hospitalist or the covering physician. Once you are discharged, your primary care physician will handle any further medical issues. Please note that NO REFILLS for any discharge medications will be authorized once you are discharged, as it is imperative that you return to your primary care physician (or establish a relationship with a primary care physician if you do not have one).   Increase activity slowly   Complete by: As directed        Discharge Medications:   Allergies as of 09/23/2023       Reactions   Dilaudid [hydromorphone Hcl] Other (See Comments)   Severe headache, generalized redness        Medication List     STOP taking these medications    acetaminophen 500 MG tablet Commonly known as: TYLENOL       TAKE  these medications    calcium carbonate 750 MG chewable tablet Commonly known as: TUMS EX Chew 2 tablets (1,500 mg total) by mouth daily as needed for heartburn.   folic acid 1 MG tablet Commonly known as: FOLVITE Take 1 tablet (1 mg total) by mouth daily.   metoprolol tartrate 50 MG tablet Commonly known as: LOPRESSOR Take 50 mg by mouth 2 (two) times daily.   multivitamin with minerals Tabs tablet Take 1 tablet by mouth daily.   ondansetron 4 MG tablet Commonly known as: ZOFRAN Take 1 tablet (4 mg total) by mouth every 6 (six) hours as needed for nausea.   oxyCODONE-acetaminophen 5-325 MG tablet Commonly known as: PERCOCET/ROXICET Take 1 tablet by mouth every 6 (six) hours as needed for up to 5 days for moderate pain (pain score 4-6) (breakthrough pain).   pantoprazole 40 MG tablet Commonly known as: Protonix Take 1 tablet (40 mg total) by mouth 2 (two) times daily before a meal.   thiamine 100 MG tablet Commonly known as: Vitamin B-1 Take 1 tablet (100 mg total) by mouth daily.         The results of significant diagnostics from this hospitalization (including imaging, microbiology, ancillary and laboratory) are listed below for reference.    Procedures and Diagnostic Studies:   CT ABDOMEN PELVIS W CONTRAST  Result Date: 09/21/2023 CLINICAL DATA:  Abdominal pain. EXAM: CT ABDOMEN AND PELVIS WITH CONTRAST TECHNIQUE: Multidetector CT imaging of the abdomen and pelvis was performed using the standard protocol following bolus administration of intravenous contrast. RADIATION DOSE REDUCTION: This exam was performed according to the departmental dose-optimization program which includes automated exposure control, adjustment of the mA and/or kV according to patient size and/or use of iterative reconstruction technique. CONTRAST:  75mL OMNIPAQUE IOHEXOL 350 MG/ML SOLN COMPARISON:  Multiple prior CTs back to 2015. The 2 most recent are both with contrast and dated 08/23/2023 and  05/20/2023. FINDINGS: Lower chest: Chronic linear scarring laterally in the right lower lobe. Lung bases are clear of infiltrates. The cardiac size is normal. Bilateral breast implants are partially visible. Hepatobiliary: No focal liver abnormality is seen. No gallstones, gallbladder wall thickening, or biliary dilatation. The liver is mildly steatotic as before. Pancreas: There is stranding of the head and neck of the pancreas. Minimal free fluid is seen in the pancreatico duodenal groove extending up into the porta hepatis. Findings could be due to acute pancreatitis or could be reactive changes related to thickening in the antrum pyloric stomach is also seen. There is no pancreatic mass enhancement or ductal dilatation. Spleen: Normal. Adrenals/Urinary Tract: Adrenal glands are unremarkable. Kidneys are normal, without renal  calculi, focal lesion, or hydronephrosis Bladder is unremarkable for the degree of distention. There is symmetric renal contrast excretion in the delayed phase. Stomach/Bowel: Moderate circumferential antropyloric gastric wall thickening is again noted with mucosal enhancement, but has been present at least as far back as a CT of abdomen and pelvis from 05/17/2018 it seems unchanged. The small bowel is normal in caliber. Surgically absent appendix again noted. Scattered colonic diverticulosis without diverticulitis or focal colitis. Vascular/Lymphatic: Scattered aortic wall thrombus, minimal calcific plaque right common iliac artery. No AAA. No new vascular findings. Multiple pelvic phleboliths. There is no adenopathy. Reproductive: The uterus is intact. There is a 2.3 cm dominant follicle of the right ovary, Hounsfield density is 21. No follow-up imaging is recommended. Left adnexal structures are unremarkable. Other: There is minimal free fluid in the pelvic cul-de-sac, probably physiologic, alternatively could be due to leakage of the right ovarian follicle. There is no free air or free  hemorrhage. Musculoskeletal: No acute or significant findings. IMPRESSION: 1. Stranding at the head and neck of pancreas with minimal free fluid in the pancreaticoduodenal groove extending up into the porta hepatis. Findings could be due to acute pancreatitis or could be reactive changes related to thickening in the antropyloric stomach. Laboratory correlation recommended. 2. Moderate circumferential antropyloric gastric wall thickening with mucosal enhancement, but has been present at least as far back as a CT of abdomen and pelvis from 05/17/2018 and it seems unchanged. 3. 2.3 cm dominant follicle of the right ovary. 4. Minimal free fluid in the pelvic cul-de-sac, probably physiologic, alternatively could be due to leakage of the right ovarian follicle. 5. Aortic and minimal right common iliac atherosclerosis. 6. Diverticulosis without evidence of diverticulitis. 7. Mild hepatic steatosis. Aortic Atherosclerosis (ICD10-I70.0). Electronically Signed   By: Almira Bar M.D.   On: 09/21/2023 05:05     Labs:   Basic Metabolic Panel: Recent Labs  Lab 09/20/23 2120 09/22/23 0537 09/23/23 0741  NA 133* 136 137  K 4.2 3.5 3.8  CL 99 105 108  CO2 22 25 22   GLUCOSE 92 96 91  BUN 10 <5* <5*  CREATININE 0.58 0.56 0.52  CALCIUM 9.9 8.6* 8.7*   GFR Estimated Creatinine Clearance: 74.2 mL/min (by C-G formula based on SCr of 0.52 mg/dL). Liver Function Tests: Recent Labs  Lab 09/20/23 2120  AST 24  ALT 14  ALKPHOS 46  BILITOT 0.9  PROT 7.0  ALBUMIN 3.9   Recent Labs  Lab 09/20/23 2120 09/22/23 0537 09/23/23 0740  LIPASE 70* 75* 31   No results for input(s): "AMMONIA" in the last 168 hours. Coagulation profile No results for input(s): "INR", "PROTIME" in the last 168 hours.  CBC: Recent Labs  Lab 09/20/23 2120 09/22/23 0537 09/23/23 0741  WBC 7.6 7.2 4.4  NEUTROABS 4.8  --  1.7  HGB 10.5* 9.8* 9.6*  HCT 32.1* 30.3* 29.5*  MCV 102.2* 102.4* 102.1*  PLT 195 190 189    Cardiac Enzymes: No results for input(s): "CKTOTAL", "CKMB", "CKMBINDEX", "TROPONINI" in the last 168 hours. BNP: Invalid input(s): "POCBNP" CBG: No results for input(s): "GLUCAP" in the last 168 hours. D-Dimer No results for input(s): "DDIMER" in the last 72 hours. Hgb A1c No results for input(s): "HGBA1C" in the last 72 hours. Lipid Profile No results for input(s): "CHOL", "HDL", "LDLCALC", "TRIG", "CHOLHDL", "LDLDIRECT" in the last 72 hours. Thyroid function studies No results for input(s): "TSH", "T4TOTAL", "T3FREE", "THYROIDAB" in the last 72 hours.  Invalid input(s): "FREET3" Anemia work up No  results for input(s): "VITAMINB12", "FOLATE", "FERRITIN", "TIBC", "IRON", "RETICCTPCT" in the last 72 hours. Microbiology No results found for this or any previous visit (from the past 240 hour(s)).  Time coordinating discharge: 45 minutes  Signed: Jaydon Soroka  [facility] 09/23/2023, 12:35 PM

## 2023-09-24 ENCOUNTER — Telehealth: Payer: Self-pay

## 2023-09-24 NOTE — Transitions of Care (Post Inpatient/ED Visit) (Signed)
   09/24/2023  Name: Melanie Moreno MRN: [id number] DOB: 1975-11-07  Today's TOC FU Call Status: Today's TOC FU Call Status:: Unsuccessful Call (1st Attempt) Unsuccessful Call (1st Attempt) Date: 09/24/23  Attempted to reach the patient regarding the most recent Inpatient/ED visit.  Follow Up Plan: Additional outreach attempts will be made to reach the patient to complete the Transitions of Care (Post Inpatient/ED visit) call.   Signature  Robyne Peers, RN

## 2023-09-25 ENCOUNTER — Telehealth: Payer: Self-pay

## 2023-09-25 NOTE — Transitions of Care (Post Inpatient/ED Visit) (Signed)
   09/25/2023  Name: Melanie Moreno MRN: [id number] DOB: 11/11/1975  Today's TOC FU Call Status: Today's TOC FU Call Status:: Unsuccessful Call (2nd Attempt) Unsuccessful Call (1st Attempt) Date: 09/24/23 Unsuccessful Call (2nd Attempt) Date: 09/25/23  Attempted to reach the patient regarding the most recent Inpatient/ED visit.  Follow Up Plan: Additional outreach attempts will be made to reach the patient to complete the Transitions of Care (Post Inpatient/ED visit) call.   Signature  Robyne Peers, RN

## 2023-09-26 ENCOUNTER — Telehealth: Payer: Self-pay

## 2023-09-26 NOTE — Transitions of Care (Post Inpatient/ED Visit) (Signed)
   09/26/2023  Name: [place] Merchan MRN: [id number] DOB: 06/25/1976  Today's TOC FU Call Status: Today's TOC FU Call Status:: Unsuccessful Call (3rd Attempt) Unsuccessful Call (1st Attempt) Date: 09/24/23 Unsuccessful Call (2nd Attempt) Date: 09/25/23 Unsuccessful Call (3rd Attempt) Date: 09/26/23  Attempted to reach the patient regarding the most recent Inpatient/ED visit.  Follow Up Plan: No further outreach attempts will be made at this time. We have been unable to contact the patient.  She has an appointment at [facility] on 10/28/2023.   Signature  Robyne Peers, RN

## 2023-10-28 ENCOUNTER — Ambulatory Visit (INDEPENDENT_AMBULATORY_CARE_PROVIDER_SITE_OTHER): Payer: Medicaid Other | Admitting: Primary Care

## 2023-11-06 ENCOUNTER — Encounter (INDEPENDENT_AMBULATORY_CARE_PROVIDER_SITE_OTHER): Payer: Self-pay | Admitting: Primary Care

## 2023-11-06 ENCOUNTER — Ambulatory Visit (INDEPENDENT_AMBULATORY_CARE_PROVIDER_SITE_OTHER): Payer: Self-pay | Admitting: Primary Care

## 2023-11-06 VITALS — BP 134/82 | HR 65 | Resp 16 | Ht 64.0 in | Wt 115.8 lb

## 2023-11-06 DIAGNOSIS — R110 Nausea: Secondary | ICD-10-CM

## 2023-11-06 DIAGNOSIS — Z124 Encounter for screening for malignant neoplasm of cervix: Secondary | ICD-10-CM

## 2023-11-06 DIAGNOSIS — Z113 Encounter for screening for infections with a predominantly sexual mode of transmission: Secondary | ICD-10-CM

## 2023-11-06 DIAGNOSIS — I10 Essential (primary) hypertension: Secondary | ICD-10-CM

## 2023-11-06 LAB — POCT GLUCOSE FINGERSTICK: Glucose: 104 — AB (ref 70–99)

## 2023-11-06 MED ORDER — AMLODIPINE BESYLATE 10 MG PO TABS: 10.0000 mg | ORAL_TABLET | Freq: Every day | ORAL | 1 refills | Status: DC

## 2023-11-06 MED ORDER — METOPROLOL TARTRATE 100 MG PO TABS
100.0000 mg | ORAL_TABLET | Freq: Every day | ORAL | 1 refills | Status: DC
Start: 2023-11-06 — End: 2023-12-16

## 2023-11-06 MED ORDER — ONDANSETRON 4 MG PO TBDP: 4.0000 mg | ORAL_TABLET | Freq: Once | ORAL | Status: DC

## 2023-11-06 MED ORDER — CLONIDINE HCL 0.1 MG PO TABS: 0.1000 mg | ORAL_TABLET | Freq: Once | ORAL | Status: DC

## 2023-11-06 NOTE — Progress Notes (Signed)
[facility]  WELL-WOMAN PHYSICAL & PAP Patient name: Melanie Moreno MRN [id number]  Date of birth: September 21, 1976 Chief Complaint:   Gynecologic Exam  History of Present Illness:   Melanie Moreno is a 48 y.o. No obstetric history on file. female being seen today for a routine well-woman exam.  Patient's blood pressure is significantly high we will treat as hypertensive urgency 0.2 mg of clonidine.  Question how much alcohol and the last time she had alcohol and how much.  She reports Sunday and unable to quantify but TOO much.  She is sweating, has a headache, blurred vision and hands are shaking.  Probable  cause is alcohol withdrawal.  She denies chest pain, shortness of breath but feels like she can see her heart racing.  Stat EKG reevaluate blood pressure in 30 minutes and add amlodipine 10 mg daily.. CC: gyn changed due to hypertensive urgency   Health Maintenance  Topic Date Due   Pneumococcal Vaccination (1 of 2 - PCV) Never done   Pap with HPV screening  Never done   Colon Cancer Screening  Never done   COVID-19 Vaccine (1 - 2024-25 season) Never done   Flu Shot  01/20/2024*   DTaP/Tdap/Td vaccine (2 - Td or Tdap) 02/20/2028   Hepatitis C Screening  Completed   HIV Screening  Completed   HPV Vaccine  Aged Out  *Topic was postponed. The date shown is not the original due date.   Review of Systems:    Denies any headaches, blurred vision, fatigue, shortness of breath, chest pain, abdominal pain, abnormal vaginal discharge/itching/odor/irritation, problems with periods, bowel movements, urination, or intercourse unless otherwise stated above.  Pertinent History Reviewed:   Reviewed past medical,surgical, social and family history.  Reviewed problem list, medications and allergies.  Physical Assessment:  BP 134/82   Pulse 65   Resp 16   Ht 5\' 4"  (1.626 m)   Wt 115 lb 12.8 oz (52.5 kg)   SpO2 99%   BMI 19.88 kg/m          Physical Examination:  General  appearance - well appearing, and in no distress Mental status - alert, oriented to person, place, and time Psych:  She has a normal mood and affect Skin - warm and dry, normal color, no suspicious lesions noted Chest - effort normal, all lung fields clear to auscultation bilaterally Heart - normal rate and regular rhythm Neck:  midline trachea, no thyromegaly or nodules Breasts - breasts appear normal, no suspicious masses, no skin or nipple changes or axillary nodes Educated patient on proper self breast examination and had patient to demonstrate SBE. Abdomen - soft, nontender, nondistended, no masses or organomegaly Pelvic-VULVA: normal appearing vulva with no masses, tenderness or lesions   VAGINA: normal appearing vagina with normal color and discharge, no lesions   CERVIX: normal appearing cervix without discharge or lesions, no CMT UTERUS: uterus is felt to be normal size, shape, consistency and nontender  ADNEXA: No adnexal masses or tenderness noted. Extremities:  No swelling or varicosities noted  No results found for this or any previous visit (from the past 24 hours).   Assessment & Plan:  Melanie Moreno was seen today for gynecologic exam.  Diagnoses and all orders for this visit:  Hypertension, uncontrolled BP goal - < 130/80 Explained that having normal blood pressure is the goal and medications are helping to get to goal and maintain normal blood pressure. DIET: Limit salt intake, read nutrition labels to check salt content, limit  fried and high fatty foods  Avoid using multisymptom OTC cold preparations that generally contain sudafed which can rise BP. Consult with pharmacist on best cold relief products to use for persons with HTN EXERCISE Discussed incorporating exercise such as walking - 30 minutes most days of the week and can do in 10 minute intervals    -     cloNIDine (CATAPRES) tablet 0.1 mg -     cloNIDine (CATAPRES) tablet 0.1 mg -     ondansetron (ZOFRAN-ODT)  disintegrating tablet 4 mg -     EKG 12-Lead -     amLODipine (NORVASC) 10 MG tablet; Take 1 tablet (10 mg total) by mouth daily.  Nausea without vomiting -     POCT Glucose Fingerstick  Other orders -     Cancel: Cervicovaginal ancillary only -     Cancel: Cytology - PAP -     metoprolol tartrate (LOPRESSOR) 100 MG tablet; Take 1 tablet (100 mg total) by mouth daily.     Follow-up: Return for reschedule pap.  This note has been created with Education officer, environmental. Any transcriptional errors are unintentional.   Grayce Sessions, NP 11/11/2023, 7:57 PM

## 2023-11-26 ENCOUNTER — Telehealth: Payer: Self-pay | Admitting: Family Medicine

## 2023-11-26 DIAGNOSIS — J208 Acute bronchitis due to other specified organisms: Secondary | ICD-10-CM

## 2023-11-26 MED ORDER — ALBUTEROL SULFATE HFA 108 (90 BASE) MCG/ACT IN AERS: 1.0000 | INHALATION_SPRAY | Freq: Four times a day (QID) | RESPIRATORY_TRACT | 0 refills | Status: AC | PRN

## 2023-11-26 MED ORDER — BENZONATATE 100 MG PO CAPS: 100.0000 mg | ORAL_CAPSULE | Freq: Three times a day (TID) | ORAL | 0 refills | Status: DC | PRN

## 2023-11-26 NOTE — Progress Notes (Signed)
E-Visit for Cough   We are sorry that you are not feeling well.  Here is how we plan to help!  Based on your presentation I believe you most likely have A cough due to a virus.  This is called viral bronchitis and is best treated by rest, plenty of fluids and control of the cough.  You may use Ibuprofen or Tylenol as directed to help your symptoms.     In addition you may use A prescription cough medication called Tessalon Perles 100mg . You may take 1-2 capsules every 8 hours as needed for your cough.  An an albuterol inhaler use as directed.  From your responses in the eVisit questionnaire you describe inflammation in the upper respiratory tract which is causing a significant cough.  This is commonly called Bronchitis and has four common causes:   Allergies Viral Infections Acid Reflux Bacterial Infection Allergies, viruses and acid reflux are treated by controlling symptoms or eliminating the cause. An example might be a cough caused by taking certain blood pressure medications. You stop the cough by changing the medication. Another example might be a cough caused by acid reflux. Controlling the reflux helps control the cough.  USE OF BRONCHODILATOR ("RESCUE") INHALERS: There is a risk from using your bronchodilator too frequently.  The risk is that over-reliance on a medication which only relaxes the muscles surrounding the breathing tubes can reduce the effectiveness of medications prescribed to reduce swelling and congestion of the tubes themselves.  Although you feel brief relief from the bronchodilator inhaler, your asthma may actually be worsening with the tubes becoming more swollen and filled with mucus.  This can delay other crucial treatments, such as oral steroid medications. If you need to use a bronchodilator inhaler daily, several times per day, you should discuss this with your provider.  There are probably better treatments that could be used to keep your asthma under control.      HOME CARE Only take medications as instructed by your medical team. Complete the entire course of an antibiotic. Drink plenty of fluids and get plenty of rest. Avoid close contacts especially the very young and the elderly Cover your mouth if you cough or cough into your sleeve. Always remember to wash your hands A steam or ultrasonic humidifier can help congestion.   GET HELP RIGHT AWAY IF: You develop worsening fever. You become short of breath You cough up blood. Your symptoms persist after you have completed your treatment plan MAKE SURE YOU  Understand these instructions. Will watch your condition. Will get help right away if you are not doing well or get worse.    Thank you for choosing an e-visit.  Your e-visit answers were reviewed by a board certified advanced clinical practitioner to complete your personal care plan. Depending upon the condition, your plan could have included both over the counter or prescription medications.  Please review your pharmacy choice. Make sure the pharmacy is open so you can pick up prescription now. If there is a problem, you may contact your provider through [employer] and have the prescription routed to another pharmacy.  Your safety is important to [place]. If you have drug allergies check your prescription carefully.   For the next 24 hours you can use MyChart to ask questions about today's visit, request a non-urgent call back, or ask for a work or school excuse. You will get an email in the next two days asking about your experience. I hope that your e-visit has  been valuable and will speed your recovery.   I provided 5 minutes of non face-to-face time during this encounter for chart review, medication and order placement, as well as and documentation.

## 2023-11-29 ENCOUNTER — Encounter: Payer: Medicaid Other | Admitting: Internal Medicine

## 2023-11-29 ENCOUNTER — Telehealth: Payer: Self-pay | Admitting: Internal Medicine

## 2023-11-29 NOTE — Telephone Encounter (Signed)
 Good Morning Dr. Bridgett Camps,  I called this patient at 9:10 am today and nobody answered. I left a message on her voicemail to call us  if she was running behind or needed to reschedule.   I will NO SHOW her--  Medicaid

## 2023-12-04 ENCOUNTER — Telehealth (INDEPENDENT_AMBULATORY_CARE_PROVIDER_SITE_OTHER): Payer: Self-pay | Admitting: Primary Care

## 2023-12-04 NOTE — Telephone Encounter (Signed)
Called pt to confirm atp. Vm left with pt.

## 2023-12-08 NOTE — Progress Notes (Deleted)
 S:     No chief complaint on file.  48 y.o. female who presents for hypertension evaluation, education, and management.  PMH is significant for HTN, alcohol-induced pancreatitis (Nov 2024), alcohol use disorder, migraine without aura, hx of GI bleeding/chronic gastritis, tobacco use disorder.  Patient was referred and last seen by Primary Care Provider, Gwinda Passe, NP on 11/06/23.  At last visit, BP was significantly elevated to 210/130 mmHg, treated in-clinic as hypertensive urgency with clonidine 0.2 mg PO and BP decreased to 134/82. Suspected cause was alcohol withdrawal. Symptoms included HA, blurred vision, shaking hands. Amlodipine 10 mg PO daily was added. Pt also takes metoprolol tartrate- which appears to be prescribed solely for blood pressure.   Today, patient arrives in *** spirits and presents without *** assistance. *** Denies dizziness, headache, blurred vision, swelling.   Patient reports hypertension was diagnosed in ***.   Family/Social history: *** - Alcohol use *** - Tobacco use *** - Fam hx: ***  Medication adherence *** . Patient has *** taken BP medications today.   Current antihypertensives include: metoprolol tartrate 100 mg PO daily (previously metoprolol tartrate 50 mg PO BID), amlodipine 10 mg PO daily  Antihypertensives tried in the past include: ***  Insurance: none? Medicaid Family Planning *** medicaid eligibility?  Reported home BP readings: ***  Patient reported dietary habits: Eats *** meals/day Breakfast: *** Lunch: *** Dinner: *** Snacks: *** Drinks: ***  Patient-reported exercise habits: ***  ASCVD risk factors include: ***  O:  ROS  Physical Exam  Last 3 Office BP readings: BP Readings from Last 3 Encounters:  11/06/23 134/82  09/23/23 (!) 154/89  09/06/23 (!) 147/93    BMET    Component Value Date/Time   NA 137 09/23/2023 0741   NA 138 09/06/2023 1030   K 3.8 09/23/2023 0741   CL 108 09/23/2023 0741   CO2 22  09/23/2023 0741   GLUCOSE 104 (A) 11/06/2023 1623   BUN <5 (L) 09/23/2023 0741   BUN 8 09/06/2023 1030   CREATININE 0.52 09/23/2023 0741   CALCIUM 8.7 (L) 09/23/2023 0741   GFRNONAA >60 09/23/2023 0741   GFRAA >60 06/05/2020 1807    Renal function: CrCl cannot be calculated (Patient's most recent lab result is older than the maximum 21 days allowed.).  Clinical ASCVD: {YES/NO:21197} The ASCVD Risk score (Arnett DK, et al., 2019) failed to calculate for the following reasons:   The valid HDL cholesterol range is 20 to 100 mg/dL  Patient is participating in a Managed Medicaid Plan:  No   Check UACR - indication for ARB? Child bearing status? BC? Change metop tartrate back to BID vs switching to carvedilol for BP? - pulse has been  on the lower side F/u alcohol and tobacco use Did not show for endoscopy 11/29/23   A/P: Hypertension diagnosed *** currently *** on current medications. BP goal < 130/80 *** mmHg. Medication adherence appears ***. Control is suboptimal due to ***.  -{Meds adjust:18428} ***.  -{Meds adjust:18428} ***.  -Patient educated on purpose, proper use, and potential adverse effects of ***.  -F/u labs ordered - *** -Counseled on lifestyle modifications for blood pressure control including reduced dietary sodium, increased exercise, adequate sleep. -Encouraged patient to check BP at home and bring log of readings to next visit. Counseled on proper use of home BP cuff.   Results reviewed and written information provided.    Written patient instructions provided. Patient verbalized understanding of treatment plan.  Total time in face to  face counseling *** minutes.    Follow-up:  Pharmacist ***. PCP clinic visit in 12/11/23.  Patient seen with ***  Nils Pyle, PharmD PGY1 Pharmacy Resident

## 2023-12-09 ENCOUNTER — Ambulatory Visit: Payer: Medicaid Other | Admitting: Pharmacist

## 2023-12-10 ENCOUNTER — Telehealth (INDEPENDENT_AMBULATORY_CARE_PROVIDER_SITE_OTHER): Payer: Self-pay | Admitting: Primary Care

## 2023-12-10 NOTE — Telephone Encounter (Signed)
 Called pt to make virtual. Pt did not answer but atp was switched to virtual because provider will not be in office.

## 2023-12-11 ENCOUNTER — Telehealth (INDEPENDENT_AMBULATORY_CARE_PROVIDER_SITE_OTHER): Payer: Medicaid Other | Admitting: Primary Care

## 2023-12-16 ENCOUNTER — Ambulatory Visit (INDEPENDENT_AMBULATORY_CARE_PROVIDER_SITE_OTHER): Payer: Self-pay | Admitting: Primary Care

## 2023-12-16 VITALS — BP 118/72 | HR 76 | Resp 16 | Wt 116.8 lb

## 2023-12-16 DIAGNOSIS — I10 Essential (primary) hypertension: Secondary | ICD-10-CM

## 2023-12-16 MED ORDER — HYDRALAZINE HCL 10 MG PO TABS: 10.0000 mg | ORAL_TABLET | Freq: Three times a day (TID) | ORAL | 1 refills | Status: DC

## 2023-12-16 MED ORDER — CLONIDINE HCL 0.1 MG PO TABS: 0.1000 mg | ORAL_TABLET | Freq: Every day | ORAL | 1 refills | Status: DC
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
## 2023-12-29 NOTE — Progress Notes (Unsigned)
 S:     No chief complaint on file.  48 y.o. female who presents for hypertension evaluation, education, and management.  PMH is significant for HTN, alcohol-induced pancreatitis (Nov 2024), alcohol use disorder, migraine without aura, hx of GI bleeding/chronic gastritis, tobacco use disorder.  Patient was referred by Primary Care Provider, Gwinda Passe, NP on 11/06/23 for HTN. Last saw PCP on 12/16/23 with hypertensive urgency BP 205/125, repeat 198/123. Treated with clonidine in clinic - BP improved to 118/77 mmHg. She was given prescriptions for hydralazine 10 mg TID and clonidine 0.1 mg daily.   At last visit, BP was significantly elevated to 210/130 mmHg, treated in-clinic as hypertensive urgency with clonidine 0.2 mg PO and BP decreased to 134/82. Suspected cause was alcohol withdrawal. Symptoms included HA, blurred vision, shaking hands. Pt also takes metoprolol tartrate- which appears to be prescribed solely for blood pressure.   Today, patient arrives in spirits and presents without assistance. Denies dizziness, headache, blurred vision, swelling. Of note, she is still taking an old metoprolol rxn - takes metoprolol tartrate 50 mg TID. Additionally, she has not started the hydralazine or clonidine yet. She reports today that the metoprolol was rx'd in the past for a cardiac arrhythmia.   Family/Social history:  - Alcohol use hx of alcohol abuse  - Tobacco use current 1 PPD smoker  - Fam hx: no pertinent positives  Medication adherence denied. See above - patient has taken metoprolol today.   Current antihypertensives include: metoprolol tartrate 100 mg PO daily (takes 1/2 tablet TID),clonidine 0.1 mg daily (not taking), hydralazine 10 mg TID (not taking)  Insurance: no rxn coverage  Reported home BP readings: none  Patient reported dietary habits:  -Compliant with sodium restriction  -Denies excessive caffeine intake  Patient-reported exercise habits:  -No formal exercise  regimen reported   O:  Vitals:   12/30/23 1542  BP: (!) 130/91  Pulse: 66     Last 3 Office BP readings: BP Readings from Last 3 Encounters:  12/16/23 118/72  11/06/23 134/82  09/23/23 (!) 154/89    BMET    Component Value Date/Time   NA 137 09/23/2023 0741   NA 138 09/06/2023 1030   K 3.8 09/23/2023 0741   CL 108 09/23/2023 0741   CO2 22 09/23/2023 0741   GLUCOSE 104 (A) 11/06/2023 1623   BUN <5 (L) 09/23/2023 0741   BUN 8 09/06/2023 1030   CREATININE 0.52 09/23/2023 0741   CALCIUM 8.7 (L) 09/23/2023 0741   GFRNONAA >60 09/23/2023 0741   GFRAA >60 06/05/2020 1807    Renal function: CrCl cannot be calculated (Patient's most recent lab result is older than the maximum 21 days allowed.).  Clinical ASCVD: No  The ASCVD Risk score (Arnett DK, et al., 2019) failed to calculate for the following reasons:   The valid HDL cholesterol range is 20 to 100 mg/dL  Patient is participating in a Managed Medicaid Plan:  No   A/P: Hypertension diagnosed currently above goal on current medications. BP goal < 130/80 mmHg. Medication adherence appears suboptimal. Interestingly enough, her BP today is close to goal after being in hypertensive emergency x2 previous visits with PCP. She is taking metoprolol tartrate TID (50mg  TID) with nothing else. She is concerned about the clonidine and hydralazine and the risk for drowsiness. She was previously rx'd amlodipine but never picked this up. It seems she is clonidine because of her response to these agents in clinic when previously in HTN-urgency.   Will change  metoprolol tartrate 50mg  TID to 100 mg BID. With her reported arrhythmia hx, I hesitate to discontinue this. Additionally, I will have her hold off on clonidine now and start with a low dose hydralazine 10 mg BID instead. Thiazides should be avoided for now with alcohol use hx and potential for electrolyte abnormalities. We could RAAS agents or CCBs in the future.  -Continued metoprolol  tartrate. Change to 100 mg BID. -Start hydralazine 10 mg BID.  -Hold clonidine for now.   -Patient educated on purpose, proper use, and potential adverse effects of hydralazine and metoprolol.  -F/u labs ordered - none today -Counseled on lifestyle modifications for blood pressure control including reduced dietary sodium, increased exercise, adequate sleep. -Encouraged patient to check BP at home and bring log of readings to next visit. Counseled on proper use of home BP cuff.   Results reviewed and written information provided.    Written patient instructions provided. Patient verbalized understanding of treatment plan.  Total time in face to face counseling 30 minutes.    Follow-up:  Pharmacist in 1 month. PCP clinic visit 01/01/2024  Butch Penny, PharmD, BCACP, CPP Clinical Pharmacist [facility] [phone]

## 2023-12-30 ENCOUNTER — Encounter: Payer: Self-pay | Admitting: Pharmacist

## 2023-12-30 ENCOUNTER — Ambulatory Visit: Payer: Self-pay | Attending: Primary Care | Admitting: Pharmacist

## 2023-12-30 VITALS — BP 130/91 | HR 66

## 2023-12-30 DIAGNOSIS — F17210 Nicotine dependence, cigarettes, uncomplicated: Secondary | ICD-10-CM | POA: Insufficient documentation

## 2023-12-30 DIAGNOSIS — I10 Essential (primary) hypertension: Secondary | ICD-10-CM | POA: Insufficient documentation

## 2023-12-30 DIAGNOSIS — Z79899 Other long term (current) drug therapy: Secondary | ICD-10-CM | POA: Insufficient documentation

## 2023-12-30 MED ORDER — HYDRALAZINE HCL 10 MG PO TABS: 10.0000 mg | ORAL_TABLET | Freq: Two times a day (BID) | ORAL | 1 refills | Status: AC

## 2023-12-30 MED ORDER — METOPROLOL TARTRATE 100 MG PO TABS: 100.0000 mg | ORAL_TABLET | Freq: Two times a day (BID) | ORAL | 1 refills | Status: DC

## 2024-01-01 ENCOUNTER — Ambulatory Visit (INDEPENDENT_AMBULATORY_CARE_PROVIDER_SITE_OTHER): Payer: Medicaid Other | Admitting: Primary Care

## 2024-01-01 NOTE — Progress Notes (Signed)
   Blood Pressure Recheck Visit  Name: Melanie Moreno MRN: [id number] Date of Birth: 12-21-1975  Sheala Dosh presents today for Blood Pressure recheck with clinical support staff.  Order for BP recheck by Ethelle Lyon , ordered on 01/01/24.   BP Readings from Last 3 Encounters:  12/30/23 (!) 130/91  12/16/23 118/72  11/06/23 134/82    Current Outpatient Medications  Medication Sig Dispense Refill   albuterol (VENTOLIN HFA) 108 (90 Base) MCG/ACT inhaler Inhale 1-2 puffs into the lungs every 6 (six) hours as needed for wheezing or shortness of breath (cough). 8 g 0   calcium carbonate (TUMS EX) 750 MG chewable tablet Chew 2 tablets (1,500 mg total) by mouth daily as needed for heartburn.     hydrALAZINE (APRESOLINE) 10 MG tablet Take 1 tablet (10 mg total) by mouth in the morning and at bedtime. 180 tablet 1   metoprolol tartrate (LOPRESSOR) 100 MG tablet Take 1 tablet (100 mg total) by mouth 2 (two) times daily. 180 tablet 1   ondansetron (ZOFRAN) 4 MG tablet Take 1 tablet (4 mg total) by mouth every 6 (six) hours as needed for nausea. 20 tablet 0   Current Facility-Administered Medications  Medication Dose Route Frequency Provider Last Rate Last Admin   cloNIDine (CATAPRES) tablet 0.1 mg  0.1 mg Oral Once        cloNIDine (CATAPRES) tablet 0.1 mg  0.1 mg Oral Once        ondansetron (ZOFRAN-ODT) disintegrating tablet 4 mg  4 mg Oral Once         Hypertensive Medication Review: Patient states that they are taking all their hypertensive medications as prescribed and their last dose of hypertensive medications was this morning   Documentation of any medication adherence discrepancies: none  Provider Recommendation:  Spoke to Gwinda Passe and they stated: To follow up as normal   Patient has been scheduled to follow up with Franky Macho our pharmacist  in 1 month and With PCP at the end of the month( appointments made )  Patient has been given provider's recommendations and does not  have any questions or concerns at this time. Patient will contact the office for any future questions or concerns.

## 2024-01-09 ENCOUNTER — Telehealth (INDEPENDENT_AMBULATORY_CARE_PROVIDER_SITE_OTHER): Payer: Self-pay | Admitting: Primary Care

## 2024-01-09 NOTE — Telephone Encounter (Signed)
 Called pt to confirm appt. VM was left for pt.

## 2024-01-20 ENCOUNTER — Ambulatory Visit (INDEPENDENT_AMBULATORY_CARE_PROVIDER_SITE_OTHER): Admitting: Primary Care

## 2024-01-31 NOTE — Progress Notes (Signed)
 S:     Chief Complaint  Patient presents with   Hypertension   48 y.o. female who presents for hypertension evaluation, education, and management.  PMH is significant for HTN, alcohol-induced pancreatitis (Nov 2024), alcohol use disorder, migraine without aura, hx of GI bleeding/chronic gastritis, tobacco use disorder.  Patient was referred by Primary Care Provider, Madelyn Schick, NP on 11/06/23 for HTN. Last saw PCP on 12/16/23 with hypertensive urgency BP 205/125, repeat 198/123. Treated with clonidine in clinic - BP improved to 118/77 mmHg. She was given prescriptions for hydralazine 10 mg TID and clonidine 0.1 mg daily.   At PCP visit on 12/16/23 BP was significantly elevated to 210/130 mmHg, treated in-clinic as hypertensive urgency with clonidine 0.2 mg PO and BP decreased to 134/82. Suspected cause was alcohol withdrawal. Symptoms included HA, blurred vision, shaking hands. At pharmacy appt on 12/30/23, BP was improved at 130/91 mmHg. Patient was taking metoprolol tartrate 50 mg TID at this time, but had not started hydralazine or clonidine. Reported that metoprolol was rx'd in the past for a cardiac arrhythmia. At BP recheck on 01/01/24, BP was 130/88 mmHg. She was instructed to increase metoprolol tartrate to 100 mg BID and start hydralazine at 10 mg BID.   Today, patient arrives in spirits and presents without assistance. Denies dizziness, headache, blurred vision, swelling, chest pain. She reports daily palpitations which is stable and unchanged per patient report. She increased metoprolol tartrate to 100 mg BID, but did not start hydralazine because she has been monitoring BP at home ~1x/week and it has been "normal." She reports that she did start her menstrual cycle today and is having cramping. She had a cigarette right before this appointment, and did have a meal higher in sodium for lunch today.   Family/Social history:  - Alcohol use: hx of alcohol abuse  - Tobacco use current 1  PPD smoker - last cigarette right before appointment.  - Fam hx: no pertinent positives  Medication adherence denied. See above - patient has taken metoprolol today.   Current antihypertensives include: metoprolol tartrate 100 mg PO daily, hydralazine 10 mg BID (not taking, but she did pick up)  Insurance: no rxn coverage  Reported home BP readings: Patient has upper arm cuff. Checking once/week. Reports readings of 129/88 and 131/89.   Patient reported dietary habits:  -Compliant with sodium restriction - did have fish and chips with tartar sauce today.  -Denies excessive caffeine intake - has soda with caffeine every morning (coke)  Patient-reported exercise habits:  -No formal exercise regimen reported   O:  Vitals:   02/03/24 1556 02/03/24 1601  BP: (!) 173/100 (!) 169/108  Pulse: (!) 57 61   *checked > 5 minutes apart after patient was resting for longer.   Last 3 Office BP readings: BP Readings from Last 3 Encounters:  02/03/24 (!) 169/108  01/01/24 130/88  12/30/23 (!) 130/91    BMET    Component Value Date/Time   NA 137 09/23/2023 0741   NA 138 09/06/2023 1030   K 3.8 09/23/2023 0741   CL 108 09/23/2023 0741   CO2 22 09/23/2023 0741   GLUCOSE 104 (A) 11/06/2023 1623   BUN <5 (L) 09/23/2023 0741   BUN 8 09/06/2023 1030   CREATININE 0.52 09/23/2023 0741   CALCIUM 8.7 (L) 09/23/2023 0741   GFRNONAA >60 09/23/2023 0741   GFRAA >60 06/05/2020 1807    Renal function: CrCl cannot be calculated (Patient's most recent lab result is older than  the maximum 21 days allowed.).  Clinical ASCVD: No  The ASCVD Risk score (Arnett DK, et al., 2019) failed to calculate for the following reasons:   The valid HDL cholesterol range is 20 to 100 mg/dL  Patient is participating in a Managed Medicaid Plan:  No   A/P: Hypertension diagnosed currently above goal on current medications. BP goal < 130/80 mmHg. Medication adherence appears suboptimal. Elevation in BP today  likely due to pain, recent tobacco use, and high sodium lunch. However, home readings continue to be slightly elevated as well. Patient is not symptomatic. Encouraged her to start hydralazine as previously prescribed. Thiazides should be avoided for now with alcohol use hx and potential for electrolyte abnormalities. We could try RAAS agents or CCBs in the future.  -Continued metoprolol tartrate 100 mg BID -Start hydralazine 10 mg BID, as previously instructed.  -Patient educated on purpose, proper use, and potential adverse effects of hydralazine and metoprolol.  -F/u labs ordered - none today -Counseled on lifestyle modifications for blood pressure control including reduced dietary sodium, increased exercise, adequate sleep. -Encouraged patient to check BP at home and bring log of readings to next visit. Counseled on proper use of home BP cuff. Encouraged patient to monitor pulse to ensure it stays > 55 bpm.   Results reviewed and written information provided.    Written patient instructions provided. Patient verbalized understanding of treatment plan.  Total time in face to face counseling 30 minutes.    Follow-up:  Pharmacist in 6 weeks - 03/23/24 PCP needs to be scheduled  Arthea Larsson, PharmD PGY1 Pharmacy Resident

## 2024-02-03 ENCOUNTER — Encounter: Payer: Self-pay | Admitting: Pharmacist

## 2024-02-03 ENCOUNTER — Ambulatory Visit: Payer: Self-pay | Attending: Primary Care | Admitting: Pharmacist

## 2024-02-03 VITALS — BP 169/108 | HR 61

## 2024-02-03 DIAGNOSIS — I10 Essential (primary) hypertension: Secondary | ICD-10-CM

## 2024-02-03 DIAGNOSIS — Z79899 Other long term (current) drug therapy: Secondary | ICD-10-CM | POA: Insufficient documentation

## 2024-02-03 DIAGNOSIS — F17210 Nicotine dependence, cigarettes, uncomplicated: Secondary | ICD-10-CM | POA: Insufficient documentation

## 2024-03-23 ENCOUNTER — Encounter: Payer: Self-pay | Admitting: Pharmacist

## 2024-03-23 ENCOUNTER — Ambulatory Visit: Payer: Self-pay | Attending: Primary Care | Admitting: Pharmacist

## 2024-03-23 VITALS — BP 179/102

## 2024-03-23 DIAGNOSIS — F17210 Nicotine dependence, cigarettes, uncomplicated: Secondary | ICD-10-CM | POA: Insufficient documentation

## 2024-03-23 DIAGNOSIS — Z79899 Other long term (current) drug therapy: Secondary | ICD-10-CM | POA: Insufficient documentation

## 2024-03-23 DIAGNOSIS — I10 Essential (primary) hypertension: Secondary | ICD-10-CM | POA: Insufficient documentation

## 2024-03-23 MED ORDER — AMLODIPINE BESYLATE 5 MG PO TABS: 5.0000 mg | ORAL_TABLET | Freq: Every day | ORAL | 1 refills | Status: DC

## 2024-03-23 NOTE — Progress Notes (Signed)
 S:     No chief complaint on file.  48 y.o. female who presents for hypertension evaluation, education, and management.  PMH is significant for HTN, alcohol-induced pancreatitis (Nov 2024), alcohol use disorder, migraine without aura, hx of GI bleeding/chronic gastritis, tobacco use disorder.  Patient was referred by Primary Care Provider, Madelyn Schick, NP on 12/16/2023 for HTN. Pt with hypertensive urgency BP 205/125, repeat 198/123. Treated with clonidine  in clinic - BP improved to 118/77 mmHg.   Pharmacy saw her on 12/30/23 and 02/03/24 thereafter. She is on metoprolol  and hydralazine .   Today, patient arrives in spirits and presents without assistance. Denies dizziness, blurred vision, swelling, chest pain. Is now adherent to hydralazine  and metoprolol .   Family/Social history:  - Alcohol use: hx of alcohol abuse  - Tobacco use current 1 PPD smoker - last cigarette right before appointment.  - Fam hx: no pertinent positives  Medication adherence denied. See above - patient has taken metoprolol  today.   Current antihypertensives include: metoprolol  tartrate 100 mg PO daily, hydralazine  10 mg BID  Insurance: no rxn coverage  Reported home BP readings: reports SBP readings in the 150s-200  Patient reported dietary habits:  -Compliant with sodium restriction - did have fish and chips with tartar sauce today.  -Denies excessive caffeine intake - has soda with caffeine every morning (coke)  Patient-reported exercise habits:  -No formal exercise regimen reported   O:  Vitals:   03/23/24 1610  BP: (!) 179/102   Last 3 Office BP readings: BP Readings from Last 3 Encounters:  03/23/24 (!) 179/102  02/03/24 (!) 169/108  01/01/24 130/88   BMET    Component Value Date/Time   NA 137 09/23/2023 0741   NA 138 09/06/2023 1030   K 3.8 09/23/2023 0741   CL 108 09/23/2023 0741   CO2 22 09/23/2023 0741   GLUCOSE 104 (A) 11/06/2023 1623   BUN <5 (L) 09/23/2023 0741   BUN 8  09/06/2023 1030   CREATININE 0.52 09/23/2023 0741   CALCIUM  8.7 (L) 09/23/2023 0741   GFRNONAA >60 09/23/2023 0741   GFRAA >60 06/05/2020 1807    Renal function: CrCl cannot be calculated (Patient's most recent lab result is older than the maximum 21 days allowed.).  Clinical ASCVD: No  The ASCVD Risk score (Arnett DK, et al., 2019) failed to calculate for the following reasons:   The valid HDL cholesterol range is 20 to 100 mg/dL  Patient is participating in a Managed Medicaid Plan:  No   A/P: Hypertension diagnosed currently above goal on current medications. BP goal < 130/80 mmHg. She is asymptomatic today. Medication adherence appears to now be optimal.. Thiazides should be avoided for now with alcohol use hx and potential for electrolyte abnormalities..  -Continued metoprolol  tartrate 100 mg BID -Continued hydralazine  10 mg BID. -Started amlodipine  5 mg daily. -Patient educated on purpose, proper use, and potential adverse effects of hydralazine  and metoprolol .  -F/u labs ordered - none today -Counseled on lifestyle modifications for blood pressure control including reduced dietary sodium, increased exercise, adequate sleep. -Encouraged patient to check BP at home and bring log of readings to next visit. Counseled on proper use of home BP cuff. Encouraged patient to monitor pulse to ensure it stays > 55 bpm.   Results reviewed and written information provided.    Written patient instructions provided. Patient verbalized understanding of treatment plan.  Total time in face to face counseling 30 minutes.    Follow-up:  Pharmacist in 2 months,  PCP in 1 month.  Marene Shape, PharmD, Becky Bowels, CPP Clinical Pharmacist [facility] [phone]

## 2024-04-20 ENCOUNTER — Telehealth (INDEPENDENT_AMBULATORY_CARE_PROVIDER_SITE_OTHER): Payer: Self-pay | Admitting: Primary Care

## 2024-04-20 NOTE — Telephone Encounter (Signed)
 Left VM with pt about their upcoming appt.

## 2024-04-27 ENCOUNTER — Ambulatory Visit (INDEPENDENT_AMBULATORY_CARE_PROVIDER_SITE_OTHER): Admitting: Primary Care

## 2024-05-22 ENCOUNTER — Telehealth: Payer: Self-pay | Admitting: Primary Care

## 2024-05-22 NOTE — Telephone Encounter (Signed)
 Called pt to confirm appt for  8/4 LVM

## 2024-05-25 ENCOUNTER — Ambulatory Visit: Admitting: Pharmacist

## 2024-05-25 NOTE — Telephone Encounter (Signed)
 Called patient, patient has been scheduled for the next available appointment date 07/13/2024 at 2:30 pm.

## 2024-05-25 NOTE — Telephone Encounter (Signed)
 Copied from CRM [id number]. Topic: Appointments - Scheduling Inquiry for Clinic >> May 25, 2024 10:28 AM Myrick T wrote:  Reason for CRM: patient called to reschedule the appt she has for today at 2:30 to Monday of next week in the afternoon. Please f/u with paitent

## 2024-06-09 ENCOUNTER — Other Ambulatory Visit: Payer: Self-pay | Admitting: Family Medicine

## 2024-06-15 ENCOUNTER — Other Ambulatory Visit: Payer: Self-pay | Admitting: Pharmacist

## 2024-06-15 NOTE — Telephone Encounter (Unsigned)
 Copied from CRM [id number]. Topic: Clinical - Medication Refill >> Jun 15, 2024 12:39 PM Charlet HERO wrote: Medication: metoprolol  tartrate (LOPRESSOR ) 100 MG tablet  Has the patient contacted their pharmacy? Yes 0 refill  This is the patient's preferred pharmacy:  [facility] [address] [place] - [address]  [place] [address] Phone: [phone] Fax: [phone]  Is this the correct pharmacy for this prescription? Yes If no, delete pharmacy and type the correct one.   Has the prescription been filled recently? Yes  Is the patient out of the medication? Yes  Has the patient been seen for an appointment in the last year OR does the patient have an upcoming appointment? Yes  Can we respond through MyChart? Yes  Agent: Please be advised that Rx refills may take up to 3 business days. We ask that you follow-up with your pharmacy.

## 2024-06-16 NOTE — Telephone Encounter (Signed)
 Duplicate request, LRF 06/09/24 for 90 days.  Requested Prescriptions  Pending Prescriptions Disp Refills   metoprolol  tartrate (LOPRESSOR ) 100 MG tablet 180 tablet 0    Sig: Take 1 tablet (100 mg total) by mouth 2 (two) times daily.     Cardiovascular:  Beta Blockers Failed - 06/16/2024  4:05 PM      Failed - Last BP in normal range    BP Readings from Last 1 Encounters:  03/23/24 (!) 179/102         Passed - Last Heart Rate in normal range    Pulse Readings from Last 1 Encounters:  02/03/24 61         Passed - Valid encounter within last 6 months    Recent Outpatient Visits           2 months ago Essential hypertension   [facility] Of [place] Fleeta Morris, Garnette CROME, RPH-CPP   4 months ago Essential hypertension   [facility] - [facility] Dept Of [place]. [facility] Fleeta Morris, Garnette CROME, RPH-CPP   5 months ago Essential hypertension   [facility] - A Dept Of [place]. [facility] Fleeta Morris Garnette CROME, RPH-CPP   6 months ago Essential hypertension   [place] Celestia Rosaline SQUIBB, NP   7 months ago Hypertension, uncontrolled   [place] Celestia Rosaline SQUIBB, NP       Future Appointments             In 3 weeks Fleeta Morris, Garnette CROME, RPH-CPP [facility] - A Dept Of [place]. [facility]

## 2024-07-10 ENCOUNTER — Telehealth: Payer: Self-pay | Admitting: Primary Care

## 2024-07-10 NOTE — Telephone Encounter (Signed)
 Lvm to confirm appt for 9/22

## 2024-07-12 NOTE — Progress Notes (Unsigned)
 S:     No chief complaint on file.  48 y.o. female who presents for hypertension evaluation, education, and management.  PMH is significant for HTN, alcohol-induced pancreatitis (Nov 2024), alcohol use disorder, migraine without aura, hx of GI bleeding/chronic gastritis, tobacco use disorder.  Patient was referred by Primary Care Provider, Rosaline Bohr, NP on 12/16/2023 for HTN. Patient last saw pharmacy on 03/23/24. Patient reported SBP readings in the 150-200s and endorsed limiting salt and caffeine intake. BP in clinic was 179/102 mmHg. Patient had no reports of dizziness, blurred vision, swelling, or chest pain. Pharmacy continued metoprolol  tartrate 100 mg BID, hydralazine  10 mg BID, and initiated amlodipine  5 mg daily.   Today, patient reports .SABRA...   Family/Social history:  - Alcohol use: hx of alcohol abuse  - Tobacco use current 1 PPD smoker - last cigarette right before appointment.  - Fam hx: no pertinent positives  Medication adherence denied. See above - patient has taken metoprolol  today.   Current antihypertensives include: metoprolol  tartrate 100 mg PO daily, hydralazine  10 mg BID  Insurance: no rxn coverage  Reported home BP readings: reports SBP readings in the 150s-200  Patient reported dietary habits:  -Compliant with sodium restriction - did have fish and chips with tartar sauce today.  -Denies excessive caffeine intake - has soda with caffeine every morning (coke)  Patient-reported exercise habits:  -No formal exercise regimen reported   O:  There were no vitals filed for this visit.  Last 3 Office BP readings: BP Readings from Last 3 Encounters:  03/23/24 (!) 179/102  02/03/24 (!) 169/108  01/01/24 130/88   BMET    Component Value Date/Time   NA 137 09/23/2023 0741   NA 138 09/06/2023 1030   K 3.8 09/23/2023 0741   CL 108 09/23/2023 0741   CO2 22 09/23/2023 0741   GLUCOSE 104 (A) 11/06/2023 1623   BUN <5 (L) 09/23/2023 0741   BUN 8  09/06/2023 1030   CREATININE 0.52 09/23/2023 0741   CALCIUM  8.7 (L) 09/23/2023 0741   GFRNONAA >60 09/23/2023 0741   GFRAA >60 06/05/2020 1807    Renal function: CrCl cannot be calculated (Patient's most recent lab result is older than the maximum 21 days allowed.).  Clinical ASCVD: No  The ASCVD Risk score (Arnett DK, et al., 2019) failed to calculate for the following reasons:   The valid HDL cholesterol range is 20 to 100 mg/dL  Patient is participating in a Managed Medicaid Plan:  No   A/P: Hypertension diagnosed currently above goal on current medications. BP goal < 130/80 mmHg. She is asymptomatic today. Medication adherence appears to now be optimal.. Thiazides should be avoided for now with alcohol use hx and potential for electrolyte abnormalities..  -Continued metoprolol  tartrate 100 mg BID -Continued hydralazine  10 mg BID. -Started amlodipine  5 mg daily. -Patient educated on purpose, proper use, and potential adverse effects of hydralazine  and metoprolol .  -F/u labs ordered - none today -Counseled on lifestyle modifications for blood pressure control including reduced dietary sodium, increased exercise, adequate sleep. -Encouraged patient to check BP at home and bring log of readings to next visit. Counseled on proper use of home BP cuff. Encouraged patient to monitor pulse to ensure it stays > 55 bpm.   Results reviewed and written information provided.    Written patient instructions provided. Patient verbalized understanding of treatment plan.  Total time in face to face counseling 30 minutes.    Follow-up:  Pharmacist in 2 months, PCP  in 1 month.  Herlene Fleeta Morris, PharmD, JAQUELINE, CPP Clinical Pharmacist [facility] [phone]

## 2024-07-13 ENCOUNTER — Ambulatory Visit: Admitting: Pharmacist

## 2024-08-17 ENCOUNTER — Encounter (HOSPITAL_BASED_OUTPATIENT_CLINIC_OR_DEPARTMENT_OTHER): Payer: Self-pay

## 2024-08-17 ENCOUNTER — Emergency Department (HOSPITAL_BASED_OUTPATIENT_CLINIC_OR_DEPARTMENT_OTHER): Payer: Self-pay

## 2024-08-17 ENCOUNTER — Other Ambulatory Visit: Payer: Self-pay

## 2024-08-17 ENCOUNTER — Emergency Department (HOSPITAL_BASED_OUTPATIENT_CLINIC_OR_DEPARTMENT_OTHER)
Admission: EM | Admit: 2024-08-17 | Discharge: 2024-08-18 | Disposition: A | Payer: Self-pay | Attending: Emergency Medicine | Admitting: Emergency Medicine

## 2024-08-17 DIAGNOSIS — K2900 Acute gastritis without bleeding: Secondary | ICD-10-CM | POA: Insufficient documentation

## 2024-08-17 DIAGNOSIS — R1013 Epigastric pain: Secondary | ICD-10-CM

## 2024-08-17 LAB — CBC
HCT: 41.1 % (ref 36.0–46.0)
Hemoglobin: 14.2 g/dL (ref 12.0–15.0)
MCH: 35.1 pg — ABNORMAL HIGH (ref 26.0–34.0)
MCHC: 34.5 g/dL (ref 30.0–36.0)
MCV: 101.5 fL — ABNORMAL HIGH (ref 80.0–100.0)
Platelets: 217 K/uL (ref 150–400)
RBC: 4.05 MIL/uL (ref 3.87–5.11)
RDW: 13.1 % (ref 11.5–15.5)
WBC: 8.6 K/uL (ref 4.0–10.5)
nRBC: 0 % (ref 0.0–0.2)

## 2024-08-17 LAB — LIPASE, BLOOD: Lipase: 14 U/L (ref 11–51)

## 2024-08-17 LAB — COMPREHENSIVE METABOLIC PANEL WITH GFR
ALT: 23 U/L (ref 0–44)
AST: 32 U/L (ref 15–41)
Albumin: 4.8 g/dL (ref 3.5–5.0)
Alkaline Phosphatase: 65 U/L (ref 38–126)
Anion gap: 15 (ref 5–15)
BUN: 8 mg/dL (ref 6–20)
CO2: 23 mmol/L (ref 22–32)
Calcium: 9.8 mg/dL (ref 8.9–10.3)
Chloride: 97 mmol/L — ABNORMAL LOW (ref 98–111)
Creatinine, Ser: 0.59 mg/dL (ref 0.44–1.00)
GFR, Estimated: >60 mL/min (ref >60)
Glucose, Bld: 74 mg/dL (ref 70–99)
Potassium: 3.9 mmol/L (ref 3.5–5.1)
Sodium: 135 mmol/L (ref 135–145)
Total Bilirubin: 0.6 mg/dL (ref 0.0–1.2)
Total Protein: 7.6 g/dL (ref 6.5–8.1)

## 2024-08-17 LAB — TROPONIN T, HIGH SENSITIVITY: Troponin T High Sensitivity: <15 ng/L (ref 0–19)

## 2024-08-17 LAB — PREGNANCY, URINE: Preg Test, Ur: NEGATIVE

## 2024-08-17 LAB — URINALYSIS, ROUTINE W REFLEX MICROSCOPIC
Bilirubin Urine: NEGATIVE
Glucose, UA: NEGATIVE mg/dL
Hgb urine dipstick: NEGATIVE
Ketones, ur: >80 mg/dL — AB
Nitrite: NEGATIVE
Protein, ur: NEGATIVE mg/dL
Specific Gravity, Urine: 1.019 (ref 1.005–1.030)
pH: 6.5 (ref 5.0–8.0)

## 2024-08-17 LAB — ETHANOL: Alcohol, Ethyl (B): <15 mg/dL (ref <15)

## 2024-08-17 MED ORDER — MORPHINE SULFATE (PF) 4 MG/ML IV SOLN
4.0000 mg | Freq: Once | INTRAVENOUS | Status: AC
  Administered 2024-08-17: 4 mg via INTRAVENOUS
  Filled 2024-08-17: qty 1

## 2024-08-17 MED ORDER — KETOROLAC TROMETHAMINE 15 MG/ML IJ SOLN
15.0000 mg | Freq: Once | INTRAMUSCULAR | Status: AC
  Administered 2024-08-17: 15 mg via INTRAVENOUS
  Filled 2024-08-17: qty 1

## 2024-08-17 MED ORDER — SODIUM CHLORIDE 0.9 % IV BOLUS
1000.0000 mL | Freq: Once | INTRAVENOUS | Status: AC
  Administered 2024-08-17: 1000 mL via INTRAVENOUS

## 2024-08-17 MED ORDER — ONDANSETRON HCL 4 MG/2ML IJ SOLN
4.0000 mg | Freq: Once | INTRAMUSCULAR | Status: AC
  Administered 2024-08-17: 4 mg via INTRAVENOUS
  Filled 2024-08-17: qty 2

## 2024-08-17 MED ORDER — FENTANYL CITRATE (PF) 50 MCG/ML IJ SOSY
50.0000 ug | PREFILLED_SYRINGE | Freq: Once | INTRAMUSCULAR | Status: DC
Start: 2024-08-17 — End: 2024-08-17
  Filled 2024-08-17 (×2): qty 1

## 2024-08-17 MED ORDER — LIDOCAINE VISCOUS HCL 2 % MT SOLN
15.0000 mL | Freq: Once | OROMUCOSAL | Status: AC
  Administered 2024-08-18: 15 mL via ORAL
  Filled 2024-08-17: qty 15

## 2024-08-17 MED ORDER — ALUM & MAG HYDROXIDE-SIMETH 200-200-20 MG/5ML PO SUSP
30.0000 mL | Freq: Once | ORAL | Status: AC
  Administered 2024-08-18: 30 mL via ORAL
  Filled 2024-08-17: qty 30

## 2024-08-17 MED ORDER — IOHEXOL 300 MG/ML  SOLN
100.0000 mL | Freq: Once | INTRAMUSCULAR | Status: AC | PRN
  Administered 2024-08-17: 100 mL via INTRAVENOUS

## 2024-08-17 MED ORDER — FAMOTIDINE IN NACL 20-0.9 MG/50ML-% IV SOLN
20.0000 mg | Freq: Once | INTRAVENOUS | Status: AC
  Administered 2024-08-18: 20 mg via INTRAVENOUS
  Filled 2024-08-17: qty 50

## 2024-08-17 NOTE — ED Triage Notes (Signed)
 Pt c/o abd pain, I believe my pancreatitis is back, I've had it before & this is how it feels. Pain onset this AM, associated vomiting.  Hx alcoholic pancreatitis, advises last drink was 2 days ago

## 2024-08-17 NOTE — ED Provider Notes (Signed)
 [place] EMERGENCY DEPARTMENT AT [facility] Provider Note   CSN: [id number] Arrival date & time: 08/17/24  2103     Patient presents with: No chief complaint on file.   Burdette Deane is a 48 y.o. female.   HPI   48 year old female with medical history significant for alcohol induced pancreatitis who presents to the emergency department with epigastric abdominal pain radiating up to her chest.  The patient states she started drinking again due to a death in the family.  She has had 1 day of epigastric abdominal pain that is sharp, severe radiating to her back, and also radiates up to her chest.  No ripping tearing sensation.  She has had associated nausea and vomiting.  It feels like prior episodes of pancreatitis.  Prior to Admission medications   Medication Sig Start Date End Date Taking? Authorizing Provider  albuterol  (VENTOLIN  HFA) 108 (90 Base) MCG/ACT inhaler Inhale 1-2 puffs into the lungs every 6 (six) hours as needed for wheezing or shortness of breath (cough). 11/26/23   Moishe Chiquita HERO, NP  amLODipine  (NORVASC ) 5 MG tablet Take 1 tablet (5 mg total) by mouth daily. 03/23/24   Newlin, Enobong, MD  calcium  carbonate (TUMS EX) 750 MG chewable tablet Chew 2 tablets (1,500 mg total) by mouth daily as needed for heartburn. 08/26/23   Cheryle Page, MD  hydrALAZINE  (APRESOLINE ) 10 MG tablet Take 1 tablet (10 mg total) by mouth in the morning and at bedtime. Patient not taking: Reported on 02/03/2024 12/30/23   Newlin, Enobong, MD  metoprolol  tartrate (LOPRESSOR ) 100 MG tablet Take 1 tablet by mouth twice daily 06/09/24   Celestia Rosaline SQUIBB, NP  ondansetron  (ZOFRAN ) 4 MG tablet Take 1 tablet (4 mg total) by mouth every 6 (six) hours as needed for nausea. 08/26/23   Cheryle Page, MD    Allergies: Dilaudid  [hydromorphone  hcl]    Review of Systems  All other systems reviewed and are negative.   Updated Vital Signs BP (!) 180/105   Pulse 68   Temp 98.2 F (36.8 C)   Resp  16   SpO2 100%   Physical Exam Vitals and nursing note reviewed.  Constitutional:      General: She is not in acute distress.    Appearance: She is well-developed.  HENT:     Head: Normocephalic and atraumatic.  Eyes:     Conjunctiva/sclera: Conjunctivae normal.  Cardiovascular:     Rate and Rhythm: Normal rate and regular rhythm.     Heart sounds: No murmur heard. Pulmonary:     Effort: Pulmonary effort is normal. No respiratory distress.     Breath sounds: Normal breath sounds.  Abdominal:     Palpations: Abdomen is soft.     Tenderness: There is abdominal tenderness in the epigastric area. There is no guarding.  Musculoskeletal:        General: No swelling.     Cervical back: Neck supple.  Skin:    General: Skin is warm and dry.     Capillary Refill: Capillary refill takes less than 2 seconds.  Neurological:     Mental Status: She is alert.  Psychiatric:        Mood and Affect: Mood normal.     (all labs ordered are listed, but only abnormal results are displayed) Labs Reviewed  COMPREHENSIVE METABOLIC PANEL WITH GFR - Abnormal; Notable for the following components:      Result Value   Chloride 97 (*)    All other components  within normal limits  CBC - Abnormal; Notable for the following components:   MCV 101.5 (*)    MCH 35.1 (*)    All other components within normal limits  URINALYSIS, ROUTINE W REFLEX MICROSCOPIC - Abnormal; Notable for the following components:   APPearance HAZY (*)    Ketones, ur >80 (*)    Leukocytes,Ua TRACE (*)    Bacteria, UA RARE (*)    All other components within normal limits  LIPASE, BLOOD  PREGNANCY, URINE  ETHANOL  TROPONIN T, HIGH SENSITIVITY    EKG: EKG Interpretation Date/Time:  Monday August 17 2024 21:18:40 EDT Ventricular Rate:  69 PR Interval:  156 QRS Duration:  76 QT Interval:  412 QTC Calculation: 441 R Axis:   87  Text Interpretation: Normal sinus rhythm Cannot rule out Anterior infarct , age undetermined  Abnormal ECG When compared with ECG of 23-Aug-2023 01:27, PREVIOUS ECG IS PRESENT Confirmed by Jerrol Agent ([id number]) on 08/17/2024 10:02:26 PM  Radiology: DG Chest Portable 1 View Result Date: 08/17/2024 EXAM: 1 VIEW(S) XRAY OF THE CHEST 08/17/2024 10:16:00 PM COMPARISON: 07/05/2023 CLINICAL HISTORY: CP, epigastric pain. Pt c/o abd pain, I believe my pancreatitis is back, I've had it before \\T \ this is how it feels. Pain onset this AM, associated vomiting. ; Hx alcoholic pancreatitis, advises last drink was 2 days ago FINDINGS: LUNGS AND PLEURA: No focal pulmonary opacity. No pulmonary edema. No pleural effusion. No pneumothorax. HEART AND MEDIASTINUM: No acute abnormality of the cardiac and mediastinal silhouettes. BONES AND SOFT TISSUES: Slight thoracic dextroscoliosis. IMPRESSION: 1. No acute process. Electronically signed by: Dorethia Molt MD 08/17/2024 10:25 PM EDT RP Workstation: [id number]     Procedures   Medications Ordered in the ED  fentaNYL  (SUBLIMAZE ) injection 50 mcg (50 mcg Intravenous Patient Refused/Not Given 08/17/24 2238)  morphine  (PF) 4 MG/ML injection 4 mg (has no administration in time range)  ondansetron  (ZOFRAN ) injection 4 mg (4 mg Intravenous Given 08/17/24 2237)  sodium chloride  0.9 % bolus 1,000 mL (1,000 mLs Intravenous New Bag/Given 08/17/24 2237)  iohexol  (OMNIPAQUE ) 300 MG/ML solution 100 mL (100 mLs Intravenous Contrast Given 08/17/24 2249)  ketorolac  (TORADOL ) 15 MG/ML injection 15 mg (15 mg Intravenous Given 08/17/24 2306)    Clinical Course as of 08/17/24 2331  Mon Aug 17, 2024  2331 Glucose: 74 [JL]    Clinical Course User Index [JL] Jerrol Agent, MD                                 Medical Decision Making Amount and/or Complexity of Data Reviewed Labs: ordered. Radiology: ordered.  Risk Prescription drug management.    48 year old female with medical history significant for alcohol induced pancreatitis who presents to the emergency  department with epigastric abdominal pain radiating up to her chest.  The patient states she started drinking again due to a death in the family.  She has had 1 day of epigastric abdominal pain that is sharp, severe radiating to her back, and also radiates up to her chest.  No ripping tearing sensation.  She has had associated nausea and vomiting.  It feels like prior episodes of pancreatitis.  On arrival, the patient was afebrile, not tachycardic or tachypneic, BP 180/105, saturating 100% on room air.  Physical exam with epigastric tenderness to palpation.  Concern primarily for alcohol-induced pancreatitis, considered gallstone pancreatitis, less likely aortic dissection, less likely ACS or PE.  Considered bad gastritis in the  setting of alcohol consumption, gastric reflux.  EKG: Normal sinus rhythm, ventricular rate 69, no acute ischemic changes.  Chest x-ray: No acute process.  Labs: CMP unremarkable, CBC without a leukocytosis or anemia, lipase normal, urinalysis unconvincing for UTI, urine pregnancy negative, ethanol level normal and cardiac troponin normal.  Repeat cardiac troponin pending.  CT abdomen pelvis pending.  Plan at time of signout to follow-up results of CT abdomen pelvis, reassess the patient, ultimate disposition pending results of reassessment and diagnostic testing results.  Signout given to Dr. Geroldine at 2330.     Final diagnoses:  Epigastric pain    ED Discharge Orders     None          Jerrol Agent, MD 08/17/24 2309

## 2024-08-18 LAB — TROPONIN T, HIGH SENSITIVITY: Troponin T High Sensitivity: <15 ng/L (ref 0–19)

## 2024-08-18 MED ORDER — PANTOPRAZOLE SODIUM 20 MG PO TBEC: 20.0000 mg | DELAYED_RELEASE_TABLET | Freq: Two times a day (BID) | ORAL | 0 refills | Status: DC

## 2024-08-18 MED ORDER — MORPHINE SULFATE (PF) 4 MG/ML IV SOLN
4.0000 mg | Freq: Once | INTRAVENOUS | Status: AC
  Administered 2024-08-18: 4 mg via INTRAVENOUS
  Filled 2024-08-18: qty 1

## 2024-08-18 MED ORDER — ONDANSETRON 8 MG PO TBDP: ORAL_TABLET | ORAL | 0 refills | Status: DC

## 2024-08-18 MED ORDER — ONDANSETRON HCL 4 MG/2ML IJ SOLN
4.0000 mg | Freq: Once | INTRAMUSCULAR | Status: AC
  Administered 2024-08-18: 4 mg via INTRAVENOUS
  Filled 2024-08-18: qty 2

## 2024-08-18 MED ORDER — MYLANTA MAXIMUM STRENGTH 400-400-40 MG/5ML PO SUSP: 15.0000 mL | Freq: Four times a day (QID) | ORAL | 0 refills | Status: DC | PRN

## 2024-08-18 NOTE — ED Provider Notes (Signed)
  Physical Exam  BP (!) 171/126   Pulse 69   Temp 98.2 F (36.8 C) (Oral)   Resp 18   SpO2 100%   Physical Exam Vitals and nursing note reviewed.  Constitutional:      Appearance: Normal appearance.  Pulmonary:     Effort: Pulmonary effort is normal.  Neurological:     Mental Status: She is alert and oriented to person, place, and time.     Procedures  Procedures  ED Course / MDM   Clinical Course as of 08/18/24 0253  Mon Aug 17, 2024  2331 Glucose: 74 [JL]    Clinical Course User Index [JL] Jerrol Agent, MD   Medical Decision Making Amount and/or Complexity of Data Reviewed Labs: ordered. Decision-making details documented in ED Course. Radiology: ordered.  Risk OTC drugs. Prescription drug management.   Care assumed from Dr. Jerrol at shift change.  Patient awaiting results of troponin and p.o. challenge.  Her repeat troponin is negative and seems to be tolerating liquids better.  She was given additional doses of pain and nausea medicine.  At this point, it appears as though patient can safely be discharged.  She will be prescribed Mylanta at her request.       Melanie Berg, MD 08/18/24 [phone]

## 2024-08-18 NOTE — Discharge Instructions (Signed)
 Begin taking Mylanta as prescribed.  Begin taking Zofran  as prescribed as needed for nausea.  Begin taking Protonix  as prescribed.  Follow-up with primary doctor if symptoms persist.

## 2024-09-08 ENCOUNTER — Other Ambulatory Visit: Payer: Self-pay | Admitting: Primary Care

## 2024-09-10 NOTE — Telephone Encounter (Signed)
 Requested Prescriptions  Pending Prescriptions Disp Refills   metoprolol  tartrate (LOPRESSOR ) 100 MG tablet [Pharmacy Med Name: Metoprolol  Tartrate 100 MG Oral Tablet] 60 tablet 0    Sig: Take 1 tablet by mouth twice daily     Cardiovascular:  Beta Blockers Failed - 09/10/2024  5:27 PM      Failed - Last BP in normal range    BP Readings from Last 1 Encounters:  08/18/24 (!) 171/126         Passed - Last Heart Rate in normal range    Pulse Readings from Last 1 Encounters:  08/18/24 69         Passed - Valid encounter within last 6 months    Recent Outpatient Visits           5 months ago Essential hypertension   [facility] [place]. [facility] Fle[place] Tonia Garnette LITTIE, RPH-CPP   7 months ago Essential hypertension   [facility] - A Dept Of [place]. [facility] Tonia Garnette LITTIE, RPH-CPP   8 months ago Essential hypertension   [facility] Dept Of [place]. [facility] Fle[place] Tonia Garnette LITTIE, RPH-CPP   8 months ago Essential hypertension   [place] Celestia Rosaline SQUIBB, NP   10 months ago Hypertension, uncontrolled   [place] [facility] Celestia Rosaline SQUIBB, NP

## 2024-10-05 ENCOUNTER — Ambulatory Visit (INDEPENDENT_AMBULATORY_CARE_PROVIDER_SITE_OTHER): Payer: Self-pay | Admitting: Primary Care

## 2024-10-18 ENCOUNTER — Other Ambulatory Visit: Payer: Self-pay

## 2024-10-18 ENCOUNTER — Encounter (HOSPITAL_COMMUNITY): Payer: Self-pay

## 2024-10-18 ENCOUNTER — Inpatient Hospital Stay (HOSPITAL_COMMUNITY)
Admission: EM | Admit: 2024-10-18 | Discharge: 2024-10-21 | DRG: 439 | Disposition: A | Discharge status: Home / Self Care | Payer: Self-pay | Attending: Internal Medicine | Admitting: Internal Medicine

## 2024-10-18 ENCOUNTER — Emergency Department (HOSPITAL_COMMUNITY): Payer: Self-pay

## 2024-10-18 DIAGNOSIS — F17200 Nicotine dependence, unspecified, uncomplicated: Secondary | ICD-10-CM | POA: Diagnosis present

## 2024-10-18 DIAGNOSIS — D7589 Other specified diseases of blood and blood-forming organs: Secondary | ICD-10-CM | POA: Diagnosis present

## 2024-10-18 DIAGNOSIS — K279 Peptic ulcer, site unspecified, unspecified as acute or chronic, without hemorrhage or perforation: Principal | ICD-10-CM

## 2024-10-18 DIAGNOSIS — K852 Alcohol induced acute pancreatitis without necrosis or infection: Principal | ICD-10-CM | POA: Diagnosis present

## 2024-10-18 DIAGNOSIS — E871 Hypo-osmolality and hyponatremia: Secondary | ICD-10-CM | POA: Diagnosis present

## 2024-10-18 DIAGNOSIS — I10 Essential (primary) hypertension: Secondary | ICD-10-CM | POA: Diagnosis present

## 2024-10-18 DIAGNOSIS — F1010 Alcohol abuse, uncomplicated: Secondary | ICD-10-CM | POA: Diagnosis present

## 2024-10-18 DIAGNOSIS — Z716 Tobacco abuse counseling: Secondary | ICD-10-CM

## 2024-10-18 DIAGNOSIS — R7401 Elevation of levels of liver transaminase levels: Secondary | ICD-10-CM | POA: Diagnosis present

## 2024-10-18 DIAGNOSIS — F17210 Nicotine dependence, cigarettes, uncomplicated: Secondary | ICD-10-CM | POA: Diagnosis present

## 2024-10-18 DIAGNOSIS — E860 Dehydration: Secondary | ICD-10-CM | POA: Diagnosis present

## 2024-10-18 DIAGNOSIS — Z885 Allergy status to narcotic agent status: Secondary | ICD-10-CM

## 2024-10-18 DIAGNOSIS — I160 Hypertensive urgency: Secondary | ICD-10-CM | POA: Diagnosis present

## 2024-10-18 DIAGNOSIS — Z79899 Other long term (current) drug therapy: Secondary | ICD-10-CM

## 2024-10-18 LAB — CBC WITH DIFFERENTIAL/PLATELET
Abs Immature Granulocytes: 0.02 K/uL (ref 0.00–0.07)
Basophils Absolute: 0 K/uL (ref 0.0–0.1)
Basophils Relative: 1 %
Eosinophils Absolute: 0 K/uL (ref 0.0–0.5)
Eosinophils Relative: 0 %
HCT: 39.7 % (ref 36.0–46.0)
Hemoglobin: 13.4 g/dL (ref 12.0–15.0)
Immature Granulocytes: 0 %
Lymphocytes Relative: 32 %
Lymphs Abs: 2 K/uL (ref 0.7–4.0)
MCH: 34.8 pg — ABNORMAL HIGH (ref 26.0–34.0)
MCHC: 33.8 g/dL (ref 30.0–36.0)
MCV: 103.1 fL — ABNORMAL HIGH (ref 80.0–100.0)
Monocytes Absolute: 1 K/uL (ref 0.1–1.0)
Monocytes Relative: 15 %
Neutro Abs: 3.2 K/uL (ref 1.7–7.7)
Neutrophils Relative %: 52 %
Platelets: 173 K/uL (ref 150–400)
RBC: 3.85 MIL/uL — ABNORMAL LOW (ref 3.87–5.11)
RDW: 14.2 % (ref 11.5–15.5)
WBC: 6.2 K/uL (ref 4.0–10.5)
nRBC: 0 % (ref 0.0–0.2)

## 2024-10-18 LAB — COMPREHENSIVE METABOLIC PANEL WITH GFR
ALT: 29 U/L (ref 0–44)
AST: 46 U/L — ABNORMAL HIGH (ref 15–41)
Albumin: 4.2 g/dL (ref 3.5–5.0)
Alkaline Phosphatase: 62 U/L (ref 38–126)
Anion gap: 10 (ref 5–15)
BUN: 9 mg/dL (ref 6–20)
CO2: 25 mmol/L (ref 22–32)
Calcium: 9.1 mg/dL (ref 8.9–10.3)
Chloride: 99 mmol/L (ref 98–111)
Creatinine, Ser: 0.59 mg/dL (ref 0.44–1.00)
GFR, Estimated: >60 mL/min
Glucose, Bld: 94 mg/dL (ref 70–99)
Potassium: 4.4 mmol/L (ref 3.5–5.1)
Sodium: 134 mmol/L — ABNORMAL LOW (ref 135–145)
Total Bilirubin: 0.3 mg/dL (ref 0.0–1.2)
Total Protein: 7 g/dL (ref 6.5–8.1)

## 2024-10-18 LAB — LIPASE, BLOOD: Lipase: 226 U/L — ABNORMAL HIGH (ref 11–51)

## 2024-10-18 LAB — HCG, SERUM, QUALITATIVE: Preg, Serum: NEGATIVE

## 2024-10-18 MED ORDER — IOHEXOL 300 MG/ML  SOLN
100.0000 mL | Freq: Once | INTRAMUSCULAR | Status: AC | PRN
  Administered 2024-10-18: 100 mL via INTRAVENOUS

## 2024-10-18 MED ORDER — SODIUM CHLORIDE 0.9 % IV BOLUS: 1000.0000 mL | Freq: Once | INTRAVENOUS | Status: DC

## 2024-10-18 MED ORDER — ONDANSETRON HCL 4 MG/2ML IJ SOLN: 4.0000 mg | Freq: Once | INTRAMUSCULAR | Status: DC

## 2024-10-18 MED ORDER — MORPHINE SULFATE (PF) 4 MG/ML IV SOLN
4.0000 mg | Freq: Once | INTRAVENOUS | Status: AC
  Administered 2024-10-20: 4 mg via INTRAVENOUS

## 2024-10-18 NOTE — ED Triage Notes (Signed)
 Mid/ upper abdominal pain that appeared yesterday. Says it feels similar to her previous gastritis or pancreatitis.

## 2024-10-18 NOTE — ED Provider Triage Note (Addendum)
 Emergency Medicine Provider Triage Evaluation Note  Gloristine Turrubiates , a 48 y.o. female  was evaluated in triage.  Pt complains of abdominal pain, nausea.  Patient notes that she did drink alcohol yesterday.  Does have a history of pancreatitis..  Review of Systems  Positive: Abdominal pain, nausea Negative: Melena, medic easier  Physical Exam  BP (!) 191/101 (BP Location: Left Arm)   Pulse 64   Temp 98.8 F (37.1 C) (Oral)   Resp 20   SpO2 97%  Gen:   Awake, no distress   Resp:  Normal effort  MSK:   Moves extremities without difficulty  Other:    Medical Decision Making  Medically screening exam initiated at 8:59 PM.  Appropriate orders placed.  Shaylie Eklund was informed that the remainder of the evaluation will be completed by another provider, this initial triage assessment does not replace that evaluation, and the importance of remaining in the ED until their evaluation is complete.  Patient is otherwise stable at this time.  Labs, imaging, medications ordered.  Awaiting bed in the back at this time.   Daralene Lonni BIRCH, PA-C 10/18/24 2103    Daralene Lonni BIRCH, PA-C 10/18/24 2103

## 2024-10-19 DIAGNOSIS — R7401 Elevation of levels of liver transaminase levels: Secondary | ICD-10-CM | POA: Diagnosis present

## 2024-10-19 DIAGNOSIS — D509 Iron deficiency anemia, unspecified: Secondary | ICD-10-CM | POA: Insufficient documentation

## 2024-10-19 DIAGNOSIS — K921 Melena: Secondary | ICD-10-CM | POA: Insufficient documentation

## 2024-10-19 DIAGNOSIS — K269 Duodenal ulcer, unspecified as acute or chronic, without hemorrhage or perforation: Secondary | ICD-10-CM | POA: Insufficient documentation

## 2024-10-19 DIAGNOSIS — I160 Hypertensive urgency: Secondary | ICD-10-CM | POA: Diagnosis present

## 2024-10-19 DIAGNOSIS — D7589 Other specified diseases of blood and blood-forming organs: Secondary | ICD-10-CM | POA: Diagnosis present

## 2024-10-19 DIAGNOSIS — E871 Hypo-osmolality and hyponatremia: Secondary | ICD-10-CM | POA: Diagnosis present

## 2024-10-19 DIAGNOSIS — B9689 Other specified bacterial agents as the cause of diseases classified elsewhere: Secondary | ICD-10-CM | POA: Insufficient documentation

## 2024-10-19 DIAGNOSIS — R1011 Right upper quadrant pain: Secondary | ICD-10-CM | POA: Insufficient documentation

## 2024-10-19 DIAGNOSIS — R1033 Periumbilical pain: Secondary | ICD-10-CM | POA: Insufficient documentation

## 2024-10-19 DIAGNOSIS — K852 Alcohol induced acute pancreatitis without necrosis or infection: Secondary | ICD-10-CM

## 2024-10-19 LAB — URINALYSIS, ROUTINE W REFLEX MICROSCOPIC
Bilirubin Urine: NEGATIVE
Glucose, UA: NEGATIVE mg/dL
Hgb urine dipstick: NEGATIVE
Ketones, ur: 20 mg/dL — AB
Nitrite: NEGATIVE
Protein, ur: NEGATIVE mg/dL
Specific Gravity, Urine: 1.02 (ref 1.005–1.030)
pH: 6 (ref 5.0–8.0)

## 2024-10-19 MED ORDER — MAGNESIUM SULFATE 2 GM/50ML IV SOLN
2.0000 g | Freq: Once | INTRAVENOUS | Status: AC
  Administered 2024-10-19: 2 g via INTRAVENOUS
  Filled 2024-10-19: qty 50

## 2024-10-19 MED ORDER — LORAZEPAM 2 MG/ML IJ SOLN: 1.0000 mg | INTRAMUSCULAR | Status: DC | PRN

## 2024-10-19 MED ORDER — SODIUM CHLORIDE 0.45 % IV BOLUS
1000.0000 mL | Freq: Once | INTRAVENOUS | Status: AC
  Administered 2024-10-19: 1000 mL via INTRAVENOUS

## 2024-10-19 MED ORDER — PANTOPRAZOLE SODIUM 40 MG IV SOLR
40.0000 mg | Freq: Once | INTRAVENOUS | Status: AC
  Administered 2024-10-19: 40 mg via INTRAVENOUS
  Filled 2024-10-19: qty 10

## 2024-10-19 MED ORDER — ADULT MULTIVITAMIN W/MINERALS CH
1.0000 | ORAL_TABLET | Freq: Every day | ORAL | Status: DC
  Administered 2024-10-19 – 2024-10-21 (×3): 1 via ORAL
  Filled 2024-10-19 (×3): qty 1

## 2024-10-19 MED ORDER — LORAZEPAM 1 MG PO TABS: 1.0000 mg | ORAL_TABLET | ORAL | Status: DC | PRN

## 2024-10-19 MED ORDER — LORAZEPAM 1 MG PO TABS
0.0000 mg | ORAL_TABLET | Freq: Four times a day (QID) | ORAL | Status: DC
  Administered 2024-10-19: 1 mg via ORAL
  Filled 2024-10-19: qty 1

## 2024-10-19 MED ORDER — ONDANSETRON HCL 4 MG PO TABS: 4.0000 mg | ORAL_TABLET | Freq: Four times a day (QID) | ORAL | Status: DC | PRN

## 2024-10-19 MED ORDER — MORPHINE SULFATE (PF) 4 MG/ML IV SOLN
4.0000 mg | Freq: Once | INTRAVENOUS | Status: AC
  Administered 2024-10-19: 4 mg via INTRAVENOUS
  Filled 2024-10-19: qty 1

## 2024-10-19 MED ORDER — FOLIC ACID 1 MG PO TABS
1.0000 mg | ORAL_TABLET | Freq: Every day | ORAL | Status: DC
  Administered 2024-10-19 – 2024-10-21 (×3): 1 mg via ORAL
  Filled 2024-10-19 (×3): qty 1

## 2024-10-19 MED ORDER — ONDANSETRON HCL 4 MG/2ML IJ SOLN: 4.0000 mg | Freq: Four times a day (QID) | INTRAMUSCULAR | Status: DC | PRN

## 2024-10-19 MED ORDER — LORAZEPAM 1 MG PO TABS: 0.0000 mg | ORAL_TABLET | Freq: Two times a day (BID) | ORAL | Status: DC

## 2024-10-19 MED ORDER — AMLODIPINE BESYLATE 5 MG PO TABS
5.0000 mg | ORAL_TABLET | Freq: Every day | ORAL | Status: DC
  Administered 2024-10-19 – 2024-10-21 (×3): 5 mg via ORAL
  Filled 2024-10-19 (×3): qty 1

## 2024-10-19 MED ORDER — METOPROLOL TARTRATE 50 MG PO TABS
100.0000 mg | ORAL_TABLET | Freq: Two times a day (BID) | ORAL | Status: DC
  Administered 2024-10-19 – 2024-10-21 (×4): 100 mg via ORAL
  Filled 2024-10-19: qty 2
  Filled 2024-10-19: qty 4
  Filled 2024-10-19: qty 2
  Filled 2024-10-19: qty 4

## 2024-10-19 MED ORDER — LACTATED RINGERS IV BOLUS
1000.0000 mL | Freq: Once | INTRAVENOUS | Status: AC
  Administered 2024-10-19: 1000 mL via INTRAVENOUS

## 2024-10-19 MED ORDER — THIAMINE MONONITRATE 100 MG PO TABS
100.0000 mg | ORAL_TABLET | Freq: Every day | ORAL | Status: DC
  Administered 2024-10-19 – 2024-10-21 (×3): 100 mg via ORAL
  Filled 2024-10-19 (×3): qty 1

## 2024-10-19 MED ORDER — HYDRALAZINE HCL 20 MG/ML IJ SOLN
10.0000 mg | INTRAMUSCULAR | Status: DC | PRN
  Administered 2024-10-20: 10 mg via INTRAVENOUS
  Filled 2024-10-19: qty 1

## 2024-10-19 MED ORDER — METOPROLOL TARTRATE 25 MG PO TABS
100.0000 mg | ORAL_TABLET | Freq: Once | ORAL | Status: AC
  Administered 2024-10-19: 100 mg via ORAL
  Filled 2024-10-19: qty 4

## 2024-10-19 MED ORDER — ACETAMINOPHEN 650 MG RE SUPP: 650.0000 mg | Freq: Four times a day (QID) | RECTAL | Status: DC | PRN

## 2024-10-19 MED ORDER — THIAMINE HCL 100 MG/ML IJ SOLN: 100.0000 mg | Freq: Every day | INTRAMUSCULAR | Status: DC

## 2024-10-19 MED ORDER — PANTOPRAZOLE SODIUM 40 MG IV SOLR
40.0000 mg | Freq: Two times a day (BID) | INTRAVENOUS | Status: DC
  Administered 2024-10-19 – 2024-10-21 (×4): 40 mg via INTRAVENOUS
  Filled 2024-10-19 (×4): qty 10

## 2024-10-19 MED ORDER — MORPHINE SULFATE (PF) 4 MG/ML IV SOLN
4.0000 mg | INTRAVENOUS | Status: DC | PRN
  Administered 2024-10-19 – 2024-10-20 (×4): 4 mg via INTRAVENOUS
  Filled 2024-10-19 (×5): qty 1

## 2024-10-19 MED ORDER — SODIUM CHLORIDE 0.9 % IV SOLN: INTRAVENOUS | Status: AC

## 2024-10-19 MED ORDER — ACETAMINOPHEN 325 MG PO TABS: 650.0000 mg | ORAL_TABLET | Freq: Four times a day (QID) | ORAL | Status: DC | PRN

## 2024-10-19 MED ORDER — ONDANSETRON HCL 4 MG/2ML IJ SOLN
4.0000 mg | Freq: Once | INTRAMUSCULAR | Status: AC
  Administered 2024-10-19: 4 mg via INTRAVENOUS
  Filled 2024-10-19: qty 2

## 2024-10-19 NOTE — H&P (Signed)
 " History and Physical    Patient: Melanie Moreno FMW:[id number] DOB: 12-Jun-1976 DOA: 10/18/2024 DOS: the patient was seen and examined on 10/19/2024 PCP: Celestia Rosaline SQUIBB, NP  Patient coming from: Home  Chief Complaint:  Chief Complaint  Patient presents with   Abdominal Pain   HPI: Melanie Moreno is a 48 y.o. female with medical history significant of microcytic anemia, hypertension, irregular heart rate, migraine headaches, tobacco use disorder, alcohol abuse, alcohol-induced pancreatitis, chronic gastritis, history of upper GI bleed due to gastric ulcer, history of duodenal ulcer, history of H. pylori positivity, alcohol abuse (2 shots of alcohol and 1 beer daily) who presented to the emergency department complaints of abdominal pain after drinking more alcohol than usual this past Friday and a single episode of emesis on Saturday.  No diarrhea, constipation, melena or hematochezia.  No flank pain, dysuria, frequency or hematuria.  She denied fever, chills, rhinorrhea, sore throat, wheezing or hemoptysis.  No chest pain, palpitations, diaphoresis, PND, orthopnea or pitting edema of the lower extremities.  No polyuria, polydipsia, polyphagia or blurred vision.   Lab work: Urinalysis with hazy appearance with ketones of 20 mg/dL, trace leukocyte esterase and rare bacteria on microscopic examination.  Serum pregnancy test was negative.  CBC showed a white count of 6.2, hemoglobin 13.4 g/dL with an MCV of 896.8 fL and platelets 173.  Lipase was 226 units/L.  CMP showed a sodium 134 mmol/L and AST 46 units/L, the rest of the CMP measurements were normal.  Imaging: CT abdomen/pelvis with contrast showing thickened folds with dilatation into the ascending duodenum and increased paraduodenal edema, indicating either gastroduodenitis or inflamed peptic ulcer disease.  Consider endoscopic follow-up.  Antropyloric gastric fold thickening, maximum wall thickness 1.4 cm, previously 1.8 cm.  There is  inflammatory change extending into the pancreaticoduodenal groove consider serum lipase to assess for pancreatitis, with with no pancreatic mass or ductal dilatation.  Fatty liver.   ED course: Initial vital signs were temperature 98.8 F, pulse 64, respiration 20, BP 191/101 mmHg and O2 sat 97% on room air.  The patient received LR 1000 mL liter bolus, metoprolol  100 mg p.o. x 1, pantoprazole  40 mg IVP x 1, morphine  4 mg IVP x 1 and ondansetron  4 mg IVP x 1.  Review of Systems: As mentioned in the history of present illness. All other systems reviewed and are negative. Past Medical History:  Diagnosis Date   Anemia    Hypertension    Irregular heart rate    Ovarian cyst    Past Surgical History:  Procedure Laterality Date   APPENDECTOMY     BIOPSY  08/23/2023   Procedure: BIOPSY;  Surgeon: Albertus Gordy HERO, MD;  Location: [facility] ENDOSCOPY;  Service: Gastroenterology;;   BREAST ENHANCEMENT SURGERY     ESOPHAGOGASTRODUODENOSCOPY (EGD) WITH PROPOFOL  N/A 08/23/2023   Procedure: ESOPHAGOGASTRODUODENOSCOPY (EGD) WITH PROPOFOL ;  Surgeon: Albertus Gordy HERO, MD;  [facility] END[facility]COPY;  Service: Gastroenterology;  Laterality: N/A;   HOT HEMOSTASIS N/A 08/23/2023   Procedure: HOT HEMOSTASIS (ARGON PLASMA COAGULATION/BICAP);  Surgeon: Albertus Gordy HERO, MD;  Location[facility] ENDOSCOPY;  Service: Gastroenterology;  Laterality: N/A;   SCLEROTHERAPY  08/23/2023   Procedure: SCLEROTHERAPY;  Surgeon: Albertus Gordy HERO, MD;  Location: [facility] ENDOSCOPY;  Service: Gastroenterology;;   Social History:  reports that she has been smoking cigarettes. She has never used smokeless tobacco. She reports that she does not currently use alcohol. She reports that she does not use drugs.  Allergies[1]  History reviewed. No pertinent  family history.  Prior to Admission medications  Medication Sig Start Date End Date Taking? Authorizing Provider  albuterol  (VENTOLIN  HFA) 108 (90 Base) MCG/ACT inhaler Inhale 1-2 puffs into the lungs every 6  (six) hours as needed for wheezing or shortness of breath (cough). 11/26/23   Moishe Chiquita HERO, NP  alum & mag hydroxide-simeth [facility] MAXIMUM STRENGTH) 400-400-40 MG/5ML suspension Take 15 mLs by mouth every 6 (six) hours as needed for indigestion. 08/18/24   Geroldine Berg, MD  amLODipine  (NORVASC ) 5 MG tablet Take 1 tablet (5 mg total) by mouth daily. 03/23/24   Newlin, Enobong, MD  calcium  carbonate (TUMS EX) 750 MG chewable tablet Chew 2 tablets (1,500 mg total) by mouth daily as needed for heartburn. 08/26/23   Cheryle Page, MD  hydrALAZINE  (APRESOLINE ) 10 MG tablet Take 1 tablet (10 mg total) by mouth in the morning and at bedtime. Patient not taking: Reported on 02/03/2024 12/30/23   Newlin, Enobong, MD  metoprolol  tartrate (LOPRESSOR ) 100 MG tablet Take 1 tablet by mouth twice daily 09/10/24   Celestia Rosaline SQUIBB, NP  ondansetron  (ZOFRAN ) 4 MG tablet Take 1 tablet (4 mg total) by mouth every 6 (six) hours as needed for nausea. 08/26/23   Cheryle Page, MD  ondansetron  (ZOFRAN -ODT) 8 MG disintegrating tablet 8mg  ODT q4 hours prn nausea 08/18/24   Geroldine Berg, MD  pantoprazole  (PROTONIX ) 20 MG tablet Take 1 tablet (20 mg total) by mouth 2 (two) times daily before a meal. 08/18/24   Geroldine Berg, MD    Physical Exam: Vitals:   10/19/24 0326 10/19/24 0626 10/19/24 0930 10/19/24 0952  BP: (!) 182/100 (!) 228/114 (!) 189/111 (!) 174/114  Pulse: 68 66 72 67  Resp: 16 16 20    Temp: 99.1 F (37.3 C) 98.4 F (36.9 C) 98.6 F (37 C)   TempSrc:  Oral Oral   SpO2: 97% 100% 99%    Physical Exam Vitals and nursing note reviewed.  Constitutional:      General: She is awake. She is not in acute distress.    Appearance: She is ill-appearing.  HENT:     Head: Normocephalic.     Nose: No rhinorrhea.     Mouth/Throat:     Mouth: Mucous membranes are dry.  Eyes:     General: No scleral icterus.    Pupils: Pupils are equal, round, and reactive to light.  Neck:     Vascular: No JVD.   Cardiovascular:     Rate and Rhythm: Normal rate and regular rhythm.     Heart sounds: S1 normal and S2 normal.  Pulmonary:     Effort: Pulmonary effort is normal.     Breath sounds: Normal breath sounds. No wheezing, rhonchi or rales.  Abdominal:     General: Bowel sounds are normal. There is no distension.     Palpations: Abdomen is soft.     Tenderness: There is abdominal tenderness in the epigastric area. There is no right CVA tenderness, left CVA tenderness, guarding or rebound.  Musculoskeletal:     Cervical back: Neck supple.     Right lower leg: No edema.     Left lower leg: No edema.  Skin:    General: Skin is warm and dry.  Neurological:     General: No focal deficit present.     Mental Status: She is alert and oriented to person, place, and time.  Psychiatric:        Mood and Affect: Mood normal.  Behavior: Behavior normal. Behavior is cooperative.     Data Reviewed:  Results are pending, will review when available.  EKG: Vent. rate 57 BPM PR interval 169 ms QRS duration 81 ms QT/QTcB 487/475 ms P-R-T axes 51 83 73 Sinus rhythm Consider left ventricular hypertrophy  Assessment and Plan: Principal Problem:   Acute alcoholic pancreatitis Observation/telemetry. Continue IV fluids. Keep n.p.o. for now. Advance diet as tolerated. Analgesics as needed. Antiemetics as needed. Pantoprazole  40 mg IVP daily. Begin CIWA protocol with lorazepam . Folate, MVI and thiamine  replacement. Magnesium  sulfate 2 g IVPB. Follow CBC, CMP and lipase in AM.  Active Problems:   Essential hypertension Meeting criteria for:   Hypertensive urgency Continue metoprolol  100 mg p.o. twice daily. Resume amlodipine  5 mg p.o. daily. Hydralazine  10 mg IVP every 4 hours as needed.    Tobacco use disorder Tobacco cessation advised. May order nicotine  replacement as needed.    Hyponatremia Minimal. Likely from GI losses/decreased oral intake. Will follow sodium level in  AM.    Macrocytosis Check B12 level.    Elevated AST (SGOT) Minimal elevation. Secondary to alcohol use. Alcohol cessation.    Advance Care Planning:   Code Status: Full Code   Consults:   Family Communication:   Severity of Illness: The appropriate patient status for this patient is OBSERVATION. Observation status is judged to be reasonable and necessary in order to provide the required intensity of service to ensure the patient's safety. The patient's presenting symptoms, physical exam findings, and initial radiographic and laboratory data in the context of their medical condition is felt to place them at decreased risk for further clinical deterioration. Furthermore, it is anticipated that the patient will be medically stable for discharge from the hospital within 2 midnights of admission.   Author: Alm Dorn Castor, MD 10/19/2024 11:12 AM  For on call review [email or web address].   This document was prepared using Dragon voice recognition software and may contain some unintended transcription errors.     [1]  Allergies Allergen Reactions   Dilaudid  [Hydromorphone  Hcl] Other (See Comments)    Severe headache, generalized redness   "

## 2024-10-19 NOTE — ED Provider Notes (Signed)
 " [facility] AT [facility] Provider Note   CSN: [id number] Arrival date & time: 10/18/24  2034     Patient presents with: Abdominal Pain   Melanie Moreno is a 48 y.o. female.   Patient is a 48 year old female with a history of hypertension, pancreatitis, chronic alcohol use, bleeding gastric ulcers currently on pantoprazole  and famotidine  who is presenting today with severe abdominal pain that started at 2 PM yesterday.  She reports that the pain is in the periumbilical and epigastric area and radiates into her back.  It causes nausea but she has not had any vomiting.  She has also not had a bowel movement in several days.  Patient reports the last time she had anything to drink was on Sunday.  She reports she normally has 2 shots and a beer but she had more to drink on Sunday prior to the pain starting.  She reports the pain is never been this bad before and it is worse anytime she tries to eat or drink anything.  She denies fever, cough or congestion.  She denies any urinary symptoms.  The history is provided by the patient.  Abdominal Pain      Prior to Admission medications  Medication Sig Start Date End Date Taking? Authorizing Provider  albuterol  (VENTOLIN  HFA) 108 (90 Base) MCG/ACT inhaler Inhale 1-2 puffs into the lungs every 6 (six) hours as needed for wheezing or shortness of breath (cough). 11/26/23   Moishe Chiquita HERO, NP  alum & mag hydroxide-simeth [facility] MAXIMUM STRENGTH) 400-400-40 MG/5ML suspension Take 15 mLs by mouth every 6 (six) hours as needed for indigestion. 08/18/24   Geroldine Berg, MD  amLODipine  (NORVASC ) 5 MG tablet Take 1 tablet (5 mg total) by mouth daily. 03/23/24   Newlin, Enobong, MD  calcium  carbonate (TUMS EX) 750 MG chewable tablet Chew 2 tablets (1,500 mg total) by mouth daily as needed for heartburn. 08/26/23   Cheryle Page, MD  hydrALAZINE  (APRESOLINE ) 10 MG tablet Take 1 tablet (10 mg total) by mouth in the morning and at  bedtime. Patient not taking: Reported on 02/03/2024 12/30/23   Newlin, Enobong, MD  metoprolol  tartrate (LOPRESSOR ) 100 MG tablet Take 1 tablet by mouth twice daily 09/10/24   Celestia Rosaline SQUIBB, NP  ondansetron  (ZOFRAN ) 4 MG tablet Take 1 tablet (4 mg total) by mouth every 6 (six) hours as needed for nausea. 08/26/23   Cheryle Page, MD  ondansetron  (ZOFRAN -ODT) 8 MG disintegrating tablet 8mg  ODT q4 hours prn nausea 08/18/24   Geroldine Berg, MD  pantoprazole  (PROTONIX ) 20 MG tablet Take 1 tablet (20 mg total) by mouth 2 (two) times daily before a meal. 08/18/24   Geroldine Berg, MD    Allergies: Dilaudid  [hydromorphone  hcl]    Review of Systems  Gastrointestinal:  Positive for abdominal pain.    Updated Vital Signs BP (!) 174/114   Pulse 67   Temp 98.6 F (37 C) (Oral)   Resp 20   SpO2 99%   Physical Exam Vitals and nursing note reviewed.  Constitutional:      General: She is not in acute distress.    Appearance: She is well-developed.     Comments: Appears uncomfortable.  HENT:     Head: Normocephalic and atraumatic.     Mouth/Throat:     Mouth: Mucous membranes are dry.  Eyes:     Pupils: Pupils are equal, round, and reactive to light.  Cardiovascular:     Rate and Rhythm: Normal  rate and regular rhythm.     Pulses: Normal pulses.     Heart sounds: Normal heart sounds. No murmur heard.    No friction rub.  Pulmonary:     Effort: Pulmonary effort is normal.     Breath sounds: Normal breath sounds. No wheezing or rales.  Abdominal:     General: Bowel sounds are normal. There is no distension.     Palpations: Abdomen is soft.     Tenderness: There is abdominal tenderness in the epigastric area and periumbilical area. There is guarding. There is no rebound.  Musculoskeletal:        General: No tenderness. Normal range of motion.     Comments: No edema  Skin:    General: Skin is warm and dry.     Findings: No rash.  Neurological:     Mental Status: She is alert and  oriented to person, place, and time.     Cranial Nerves: No cranial nerve deficit.  Psychiatric:        Behavior: Behavior normal.     (all labs ordered are listed, but only abnormal results are displayed) Labs Reviewed  COMPREHENSIVE METABOLIC PANEL WITH GFR - Abnormal; Notable for the following components:      Result Value   Sodium 134 (*)    AST 46 (*)    All other components within normal limits  CBC WITH DIFFERENTIAL/PLATELET - Abnormal; Notable for the following components:   RBC 3.85 (*)    MCV 103.1 (*)    MCH 34.8 (*)    All other components within normal limits  LIPASE, BLOOD - Abnormal; Notable for the following components:   Lipase 226 (*)    All other components within normal limits  URINALYSIS, ROUTINE W REFLEX MICROSCOPIC - Abnormal; Notable for the following components:   APPearance HAZY (*)    Ketones, ur 20 (*)    Leukocytes,Ua TRACE (*)    Bacteria, UA RARE (*)    All other components within normal limits  HCG, SERUM, QUALITATIVE    EKG: None  Radiology: CT ABDOMEN PELVIS W CONTRAST Result Date: 10/18/2024 EXAM: CT ABDOMEN AND PELVIS WITH CONTRAST 10/18/2024 11:27:00 PM TECHNIQUE: CT of the abdomen and pelvis was performed with the administration of 100 mL of iohexol  (OMNIPAQUE ) 300 MG/ML solution. Multiplanar reformatted images are provided for review. Automated exposure control, iterative reconstruction, and/or weight-based adjustment of the mA/kV was utilized to reduce the radiation dose to as low as reasonably achievable. COMPARISON: CT abdomen and pelvis with IV contrast 08/17/2024 and 09/21/2023. CLINICAL HISTORY: Epigastric pain. FINDINGS: LOWER CHEST: Bilateral breast implants are partially visible. LIVER: The liver is mildly steatotic. No mass enhancement. GALLBLADDER AND BILE DUCTS: Gallbladder is unremarkable. No biliary ductal dilatation. SPLEEN: No acute abnormality. PANCREAS: Inflammatory changes associated with the descending duodenum extend  into the pancreaticoduodenal groove, probably not associated with pancreatitis but would correlate with serum lipase for potential significance. There is no pancreatic mass or ductal dilatation. ADRENAL GLANDS: No acute abnormality. KIDNEYS, URETERS AND BLADDER: No stones in the kidneys or ureters. No hydronephrosis. No perinephric or periureteral stranding. There is no renal mass. Urinary bladder is unremarkable. GI AND BOWEL: Antropyloric fold thickening is again noted in the stomach, maximum wall thickness 1.4 cm, previously 1.8 cm. There are continued thickened folds with dilatation in the descending duodenum. There is increased paraduodenal edema indicating either gastroduodenitis or inflamed peptic ulcer disease. The remaining small bowel is unremarkable. The appendix is surgically absent. No findings  of acute colitis or diverticulitis. PERITONEUM AND RETROPERITONEUM: No ascites. No free air. VASCULATURE: Aorta is normal in caliber. LYMPH NODES: No lymphadenopathy. REPRODUCTIVE ORGANS: Multiple pelvic phleboliths. Unremarkable uterus and ovaries. BONES AND SOFT TISSUES: No acute osseous abnormality. No focal soft tissue abnormality. IMPRESSION: 1. Thickened folds with dilatation in the descending duodenum and increased paraduodenal edema, indicating either gastroduodenitis or inflamed peptic ulcer disease. Consider endoscopic follow-up. 2. Antropyloric gastric fold thickening, maximum wall thickness 1.4 cm, previously 1.8 cm. 3. Inflammatory changes extend into the pancreaticoduodenal groove; consider serum lipase to assess for pancreatitis, with no pancreatic mass or ductal dilatation. 4. Fatty liver. Electronically signed by: Francis Quam MD 10/18/2024 11:55 PM EST RP Workstation: [id number]     Procedures   Medications Ordered in the ED  morphine  (PF) 4 MG/ML injection 4 mg (has no administration in time range)  ondansetron  (ZOFRAN ) injection 4 mg (has no administration in time range)  morphine   (PF) 4 MG/ML injection 4 mg (has no administration in time range)  pantoprazole  (PROTONIX ) injection 40 mg (has no administration in time range)  iohexol  (OMNIPAQUE ) 300 MG/ML solution 100 mL (100 mLs Intravenous Contrast Given 10/18/24 2320)  lactated ringers  bolus 1,000 mL (1,000 mLs Intravenous New Bag/Given 10/19/24 0932)  morphine  (PF) 4 MG/ML injection 4 mg (4 mg Intravenous Given 10/19/24 0930)  ondansetron  (ZOFRAN ) injection 4 mg (4 mg Intravenous Given 10/19/24 0929)  metoprolol  tartrate (LOPRESSOR ) tablet 100 mg (100 mg Oral Given 10/19/24 9047)                                    Medical Decision Making Amount and/or Complexity of Data Reviewed External Data Reviewed: notes. Labs: ordered. Decision-making details documented in ED Course. Radiology: ordered and independent interpretation performed. Decision-making details documented in ED Course.  Risk Prescription drug management. Decision regarding hospitalization.   Pt with multiple medical problems and comorbidities and presenting today with a complaint that caries a high risk for morbidity and mortality.  Here today with complaint of abdominal pain and nausea.  Patient has been using alcohol and does report heavier use prior to the pain starting.  She does have a history of stomach ulcers and is currently taking pantoprazole  and famotidine .  She has not had any hematemesis or black stools.  On exam she has periumbilical abdominal pain with some guarding.  She is hypertensive which is most likely from pain.  Patient has been waiting for 12-1/2 hours did receive morphine  at 9 PM last night but otherwise has not had anything. I independently interpreted patient's labs and CMP within normal limits except for minimal elevated AST of 46, CBC without acute findings with stable hemoglobin of 13 and lipase is elevated at 226 with a negative urine pregnancy. I have independently visualized and interpreted pt's images today.  CT today of  the abdomen pelvis with concern for inflamed pancreas.  Etiology reports thickened folds with dilation in the descending duodenum and increased periduodenal edema indicating either gastroduodenitis or inflamed peptic ulcer which patient reports she recently had cauterized a few months ago and is seeing GI.  Also radiology reports improvement in the anterior pyloric gastric fold thickening and inflammatory changes in the pancreaticoduodenal groove concerning for pancreatitis but no mass or ductal dilation is seen.  This is consistent with patient's elevated lipase and her history of more alcohol use than baseline.  Will give IV fluids pain and nausea control  and reassess.  On repeat evaluation patient is still having significant pain.  She was able to hold down her blood pressure medication with some improvement in her blood pressure.  However given ongoing pain, pancreatitis feel that she will need admission for continued IV fluids and pain control.  Patient reports she does not go through alcohol withdrawal.  Consulted the hospitalist for admission.     Final diagnoses:  PUD (peptic ulcer disease)  Alcohol-induced acute pancreatitis without infection or necrosis    ED Discharge Orders     None          Doretha Folks, MD 10/19/24 1101  "

## 2024-10-19 NOTE — ED Notes (Signed)
 Pt was informed that she has an order for NPO; she is actively eating soup in the room.

## 2024-10-20 DIAGNOSIS — E871 Hypo-osmolality and hyponatremia: Secondary | ICD-10-CM

## 2024-10-20 DIAGNOSIS — I160 Hypertensive urgency: Secondary | ICD-10-CM

## 2024-10-20 DIAGNOSIS — R7401 Elevation of levels of liver transaminase levels: Secondary | ICD-10-CM

## 2024-10-20 DIAGNOSIS — I10 Essential (primary) hypertension: Secondary | ICD-10-CM

## 2024-10-20 DIAGNOSIS — F17200 Nicotine dependence, unspecified, uncomplicated: Secondary | ICD-10-CM

## 2024-10-20 DIAGNOSIS — D7589 Other specified diseases of blood and blood-forming organs: Secondary | ICD-10-CM

## 2024-10-20 LAB — CBC
HCT: 39 % (ref 36.0–46.0)
Hemoglobin: 12.9 g/dL (ref 12.0–15.0)
MCH: 34.4 pg — ABNORMAL HIGH (ref 26.0–34.0)
MCHC: 33.1 g/dL (ref 30.0–36.0)
MCV: 104 fL — ABNORMAL HIGH (ref 80.0–100.0)
Platelets: 144 K/uL — ABNORMAL LOW (ref 150–400)
RBC: 3.75 MIL/uL — ABNORMAL LOW (ref 3.87–5.11)
RDW: 14 % (ref 11.5–15.5)
WBC: 5.4 K/uL (ref 4.0–10.5)
nRBC: 0 % (ref 0.0–0.2)

## 2024-10-20 LAB — COMPREHENSIVE METABOLIC PANEL WITH GFR
ALT: 28 U/L (ref 0–44)
AST: 38 U/L (ref 15–41)
Albumin: 4.2 g/dL (ref 3.5–5.0)
Alkaline Phosphatase: 60 U/L (ref 38–126)
Anion gap: 9 (ref 5–15)
BUN: <5 mg/dL — ABNORMAL LOW (ref 6–20)
CO2: 26 mmol/L (ref 22–32)
Calcium: 8.7 mg/dL — ABNORMAL LOW (ref 8.9–10.3)
Chloride: 101 mmol/L (ref 98–111)
Creatinine, Ser: 0.51 mg/dL (ref 0.44–1.00)
GFR, Estimated: >60 mL/min
Glucose, Bld: 103 mg/dL — ABNORMAL HIGH (ref 70–99)
Potassium: 4 mmol/L (ref 3.5–5.1)
Sodium: 136 mmol/L (ref 135–145)
Total Bilirubin: 0.3 mg/dL (ref 0.0–1.2)
Total Protein: 6.8 g/dL (ref 6.5–8.1)

## 2024-10-20 LAB — LIPASE, BLOOD: Lipase: 53 U/L — ABNORMAL HIGH (ref 11–51)

## 2024-10-20 LAB — VITAMIN B12: Vitamin B-12: 396 pg/mL (ref 180–914)

## 2024-10-20 LAB — PHOSPHORUS: Phosphorus: 3 mg/dL (ref 2.5–4.6)

## 2024-10-20 LAB — HIV ANTIBODY (ROUTINE TESTING W REFLEX): HIV Screen 4th Generation wRfx: NONREACTIVE

## 2024-10-20 MED ORDER — OXYCODONE HCL 5 MG PO TABS
5.0000 mg | ORAL_TABLET | Freq: Once | ORAL | Status: AC | PRN
  Administered 2024-10-20: 5 mg via ORAL
  Filled 2024-10-20: qty 1

## 2024-10-20 MED ORDER — MORPHINE SULFATE (PF) 2 MG/ML IV SOLN
2.0000 mg | INTRAVENOUS | Status: DC | PRN
  Administered 2024-10-20 – 2024-10-21 (×3): 2 mg via INTRAVENOUS
  Filled 2024-10-20 (×3): qty 1

## 2024-10-20 NOTE — Progress Notes (Signed)
" ° °  Brief Progress Note   _____________________________________________________________________________________________________________  Patient Name: Melanie Moreno Patient DOB: 1976/02/11 Date: @TODAY @      Data: Reviewed labs, notes, VS.    Action: No action required at this time.      Response:    _____________________________________________________________________________________________________________  The [facility] RN Expeditor Sharolyn JONETTA Batman Please contact us  directly via secure chat (search for [facility]) or by calling us  at [phone] [facility]).  "

## 2024-10-20 NOTE — Progress Notes (Signed)
 " Progress Note   Patient: Melanie Moreno FMW:[id number] DOB: 01/05/1976 DOA: 10/18/2024     1 DOS: the patient was seen and examined on 10/20/2024   Brief hospital course: Melanie Moreno is a 48 y.o. female with medical history significant of microcytic anemia, hypertension, irregular heart rate, migraine headaches, tobacco use disorder, alcohol abuse, alcohol-induced pancreatitis, chronic gastritis, history of upper GI bleed due to gastric ulcer, history of duodenal ulcer, history of H. pylori positivity, alcohol abuse (2 shots of alcohol and 1 beer daily) who presented to the emergency department complaints of abdominal pain after drinking more alcohol than usual this past Friday and a single episode of emesis on Saturday.  CT abdomen pelvis with contrast showed thickening folds with dilation into ascending duodenum and increased periduodenal edema indicating either gastroduodenitis or inflamed peptic ulcer disease, antropyloric gastric fold thickening, inflammatory change extending into pancreaticoduodenal groove possibly related to pancreatitis.  Patient is admitted to hospitalist service for further management evaluation of acute alcoholic pancreatitis.  Assessment and Plan: Acute alcoholic pancreatitis: Continue gentle IV fluids. Patient is able to tolerate liquids, advance to full liquid diet. Continue antiemetics, analgesics. Continue pantoprazole  40 mg IV daily. Continue thiamine , folate and multivitamin.  Alcohol abuse: Watch for withdrawal symptoms. Ativan  as needed per CIWA protocol. Discussed with RN regarding quitting alcohol. TOC for substance abuse resources.  Essential hypertension: Blood pressure is elevated.  Resumed home dose metoprolol , amlodipine , hydralazine  as needed.  Smoker: Smoking cessation counseled. Will order nicotine  patch as needed.  Hyponatremia: Secondary to dehydration, GI losses. Continue fluids.  Trend sodium.  Macrocytosis- Folate and b12 levels  ordered.      Out of bed to chair. Incentive spirometry. Nursing supportive care. Fall, aspiration precautions. Diet:  Diet Orders (From admission, onward)     Start     Ordered   10/20/24 1056  Diet full liquid Room service appropriate? Yes; Fluid consistency: Thin  Diet effective now       Question Answer Comment  Room service appropriate? Yes   Fluid consistency: Thin      10/20/24 1055           DVT prophylaxis: SCDs Start: 10/19/24 1123  Level of care: Telemetry   Code Status: Full Code  Subjective: Patient is seen and examined today morning. She has epigastric abdominal discomfort, no nausea.  Wishes to eat/drink.    Physical Exam: Vitals:   10/20/24 1348 10/20/24 1653 10/20/24 1700 10/20/24 1704  BP: (!) 149/93  (!) 154/108   Pulse: 62 61 (!) 52   Resp: 20 18 14    Temp: 97.7 F (36.5 C) 97.7 F (36.5 C)  97.9 F (36.6 C)  TempSrc:  Oral  Oral  SpO2: 97% 99% 99%     General - Young Caucasian female, no apparent distress HEENT - PERRLA, EOMI, atraumatic head, non tender sinuses. Lung - Clear, no rales, rhonchi, wheezes. Heart - S1, S2 heard, no murmurs, rubs, no pedal edema. Abdomen - Soft, gastric tenderness, bowel sounds good Neuro - Alert, awake and oriented x 3, non focal exam. Skin - Warm and dry.  Data Reviewed:      Latest Ref Rng & Units 10/20/2024    6:05 AM 10/18/2024    9:03 PM 08/17/2024    9:28 PM  CBC  WBC 4.0 - 10.5 K/uL 5.4  6.2  8.6   Hemoglobin 12.0 - 15.0 g/dL 87.0  86.5  85.7   Hematocrit 36.0 - 46.0 % 39.0  39.7  41.1  Platelets 150 - 400 K/uL 144  173  217       Latest Ref Rng & Units 10/20/2024    6:05 AM 10/18/2024    9:03 PM 08/17/2024    9:28 PM  BMP  Glucose 70 - 99 mg/dL 896  94  74   BUN 6 - 20 mg/dL 5  9  8    Creatinine 0.44 - 1.00 mg/dL 9.48  9.40  9.40   Sodium 135 - 145 mmol/L 136  134  135   Potassium 3.5 - 5.1 mmol/L 4.0  4.4  3.9   Chloride 98 - 111 mmol/L 101  99  97   CO2 22 - 32 mmol/L 26  25   23    Calcium  8.9 - 10.3 mg/dL 8.7  9.1  9.8    CT ABDOMEN PELVIS W CONTRAST Result Date: 10/18/2024 EXAM: CT ABDOMEN AND PELVIS WITH CONTRAST 10/18/2024 11:27:00 PM TECHNIQUE: CT of the abdomen and pelvis was performed with the administration of 100 mL of iohexol  (OMNIPAQUE ) 300 MG/ML solution. Multiplanar reformatted images are provided for review. Automated exposure control, iterative reconstruction, and/or weight-based adjustment of the mA/kV was utilized to reduce the radiation dose to as low as reasonably achievable. COMPARISON: CT abdomen and pelvis with IV contrast 08/17/2024 and 09/21/2023. CLINICAL HISTORY: Epigastric pain. FINDINGS: LOWER CHEST: Bilateral breast implants are partially visible. LIVER: The liver is mildly steatotic. No mass enhancement. GALLBLADDER AND BILE DUCTS: Gallbladder is unremarkable. No biliary ductal dilatation. SPLEEN: No acute abnormality. PANCREAS: Inflammatory changes associated with the descending duodenum extend into the pancreaticoduodenal groove, probably not associated with pancreatitis but would correlate with serum lipase for potential significance. There is no pancreatic mass or ductal dilatation. ADRENAL GLANDS: No acute abnormality. KIDNEYS, URETERS AND BLADDER: No stones in the kidneys or ureters. No hydronephrosis. No perinephric or periureteral stranding. There is no renal mass. Urinary bladder is unremarkable. GI AND BOWEL: Antropyloric fold thickening is again noted in the stomach, maximum wall thickness 1.4 cm, previously 1.8 cm. There are continued thickened folds with dilatation in the descending duodenum. There is increased paraduodenal edema indicating either gastroduodenitis or inflamed peptic ulcer disease. The remaining small bowel is unremarkable. The appendix is surgically absent. No findings of acute colitis or diverticulitis. PERITONEUM AND RETROPERITONEUM: No ascites. No free air. VASCULATURE: Aorta is normal in caliber. LYMPH NODES: No  lymphadenopathy. REPRODUCTIVE ORGANS: Multiple pelvic phleboliths. Unremarkable uterus and ovaries. BONES AND SOFT TISSUES: No acute osseous abnormality. No focal soft tissue abnormality. IMPRESSION: 1. Thickened folds with dilatation in the descending duodenum and increased paraduodenal edema, indicating either gastroduodenitis or inflamed peptic ulcer disease. Consider endoscopic follow-up. 2. Antropyloric gastric fold thickening, maximum wall thickness 1.4 cm, previously 1.8 cm. 3. Inflammatory changes extend into the pancreaticoduodenal groove; consider serum lipase to assess for pancreatitis, with no pancreatic mass or ductal dilatation. 4. Fatty liver. Electronically signed by: Francis Quam MD 10/18/2024 11:55 PM EST RP Workstation: [id number]    Family Communication: Discussed with patient, understand and agree. All questions answered.  Disposition: Status is: Inpatient Remains inpatient appropriate because: Pancreatitis, IV hydration, electrolyte management, pain control  Planned Discharge Destination: Home     Time spent: 46 minutes  Author: Concepcion Riser, MD 10/20/2024 5:24 PM Secure chat 7am to 7pm For on call review [email or web address].    "

## 2024-10-21 ENCOUNTER — Other Ambulatory Visit (HOSPITAL_COMMUNITY): Payer: Self-pay

## 2024-10-21 LAB — FOLATE: Folate: >20 ng/mL

## 2024-10-21 LAB — LIPASE, BLOOD: Lipase: 23 U/L (ref 11–51)

## 2024-10-21 MED ORDER — ADULT MULTIVITAMIN W/MINERALS CH
1.0000 | ORAL_TABLET | Freq: Every day | ORAL | 1 refills | Status: AC
  Filled 2024-10-21: qty 30, 30d supply, fill #0

## 2024-10-21 MED ORDER — FOLIC ACID 1 MG PO TABS
1.0000 mg | ORAL_TABLET | Freq: Every day | ORAL | 1 refills | Status: AC
  Filled 2024-10-21: qty 30, 30d supply, fill #0

## 2024-10-21 MED ORDER — ONDANSETRON HCL 4 MG PO TABS
4.0000 mg | ORAL_TABLET | Freq: Four times a day (QID) | ORAL | 0 refills | Status: AC | PRN
  Filled 2024-10-21: qty 20, 5d supply, fill #0

## 2024-10-21 MED ORDER — PANTOPRAZOLE SODIUM 20 MG PO TBEC
20.0000 mg | DELAYED_RELEASE_TABLET | Freq: Two times a day (BID) | ORAL | 1 refills | Status: AC
  Filled 2024-10-21: qty 60, 30d supply, fill #0

## 2024-10-21 MED ORDER — VITAMIN B-1 100 MG PO TABS
100.0000 mg | ORAL_TABLET | Freq: Every day | ORAL | 1 refills | Status: AC
  Filled 2024-10-21: qty 30, 30d supply, fill #0

## 2024-10-21 NOTE — Progress Notes (Signed)
 Medication delivered in secure bag to patient from [facility] outpatient pharmacy.

## 2024-10-21 NOTE — Progress Notes (Signed)
 Discharge instructions reviewed with patient, verbalized understanding. All questions answered. All belongings accounted for. Patient to follow up with MD in  1-2 weeks. PIV removed.

## 2024-10-21 NOTE — Discharge Summary (Signed)
 " Physician Discharge Summary   Patient: Melanie Moreno MRN: [id number] DOB: [birth year]  Admit date:     10/18/2024  Discharge date: {dischdate:26783}  Discharge Physician: Concepcion Riser   PCP: Celestia Rosaline SQUIBB, NP   Recommendations at discharge:  {Tip this will not be part of the note when signed- Example include specific recommendations for outpatient follow-up, pending tests to follow-up on. (Optional):26781}  ***  Discharge Diagnoses: Principal Problem:   Acute alcoholic pancreatitis Active Problems:   Essential hypertension   Tobacco use disorder   Hypertensive urgency   Hyponatremia   Macrocytosis   Elevated AST (SGOT)  Resolved Problems:   * No resolved hospital problems. [facility] Course: No notes on file  Assessment and Plan: No notes have been filed under this hospital service. Service: Hospitalist     {Tip this will not be part of the note when signed Body mass index is 19.74 kg/m. , ,  (Optional):26781}  {(NOTE) Pain control PDMP Statment (Optional):26782} Consultants: *** Procedures performed: ***  Disposition: {Plan; Disposition:26390} Diet recommendation:  Discharge Diet Orders (From admission, onward)     Start     Ordered   10/21/24 0000  Diet - low sodium heart healthy        10/21/24 1054           {Diet_Plan:26776} DISCHARGE MEDICATION: Allergies as of 10/21/2024       Reactions   Dilaudid  [hydromorphone  Hcl] Other (See Comments)   Severe headache, generalized redness        Medication List     TAKE these medications    albuterol  108 (90 Base) MCG/ACT inhaler Commonly known as: VENTOLIN  HFA Inhale 1-2 puffs into the lungs every 6 (six) hours as needed for wheezing or shortness of breath (cough).   amLODipine  5 MG tablet Commonly known as: NORVASC  Take 1 tablet (5 mg total) by mouth daily.   folic acid  1 MG tablet Commonly known as: FOLVITE  Take 1 tablet (1 mg total) by mouth daily. Start taking on: October 22, 2024   hydrALAZINE  10 MG tablet Commonly known as: APRESOLINE  Take 1 tablet (10 mg total) by mouth in the morning and at bedtime. What changed: when to take this   metoprolol  tartrate 100 MG tablet Commonly known as: LOPRESSOR  Take 1 tablet by mouth twice daily   multivitamin with minerals Tabs tablet Take 1 tablet by mouth daily. Start taking on: October 22, 2024   ondansetron  4 MG tablet Commonly known as: ZOFRAN  Take 1 tablet (4 mg total) by mouth every 6 (six) hours as needed for nausea.   pantoprazole  20 MG tablet Commonly known as: PROTONIX  Take 1 tablet (20 mg total) by mouth 2 (two) times daily before a meal.   thiamine  100 MG tablet Commonly known as: Vitamin B-1 Take 1 tablet (100 mg total) by mouth daily. Start taking on: October 22, 2024        Discharge Exam: Fredricka Weights   10/20/24 1801  Weight: 52.2 kg   ***  Condition at discharge: {DC Condition:26389}  The results of significant diagnostics from this hospitalization (including imaging, microbiology, ancillary and laboratory) are listed below for reference.   Imaging Studies: CT ABDOMEN PELVIS W CONTRAST Result Date: 10/18/2024 EXAM: CT ABDOMEN AND PELVIS WITH CONTRAST 10/18/2024 11:27:00 PM TECHNIQUE: CT of the abdomen and pelvis was performed with the administration of 100 mL of iohexol  (OMNIPAQUE ) 300 MG/ML solution. Multiplanar reformatted images are provided for review. Automated exposure control, iterative reconstruction, and/or weight-based  adjustment of the mA/kV was utilized to reduce the radiation dose to as low as reasonably achievable. COMPARISON: CT abdomen and pelvis with IV contrast 08/17/2024 and 09/21/2023. CLINICAL HISTORY: Epigastric pain. FINDINGS: LOWER CHEST: Bilateral breast implants are partially visible. LIVER: The liver is mildly steatotic. No mass enhancement. GALLBLADDER AND BILE DUCTS: Gallbladder is unremarkable. No biliary ductal dilatation. SPLEEN: No acute abnormality.  PANCREAS: Inflammatory changes associated with the descending duodenum extend into the pancreaticoduodenal groove, probably not associated with pancreatitis but would correlate with serum lipase for potential significance. There is no pancreatic mass or ductal dilatation. ADRENAL GLANDS: No acute abnormality. KIDNEYS, URETERS AND BLADDER: No stones in the kidneys or ureters. No hydronephrosis. No perinephric or periureteral stranding. There is no renal mass. Urinary bladder is unremarkable. GI AND BOWEL: Antropyloric fold thickening is again noted in the stomach, maximum wall thickness 1.4 cm, previously 1.8 cm. There are continued thickened folds with dilatation in the descending duodenum. There is increased paraduodenal edema indicating either gastroduodenitis or inflamed peptic ulcer disease. The remaining small bowel is unremarkable. The appendix is surgically absent. No findings of acute colitis or diverticulitis. PERITONEUM AND RETROPERITONEUM: No ascites. No free air. VASCULATURE: Aorta is normal in caliber. LYMPH NODES: No lymphadenopathy. REPRODUCTIVE ORGANS: Multiple pelvic phleboliths. Unremarkable uterus and ovaries. BONES AND SOFT TISSUES: No acute osseous abnormality. No focal soft tissue abnormality. IMPRESSION: 1. Thickened folds with dilatation in the descending duodenum and increased paraduodenal edema, indicating either gastroduodenitis or inflamed peptic ulcer disease. Consider endoscopic follow-up. 2. Antropyloric gastric fold thickening, maximum wall thickness 1.4 cm, previously 1.8 cm. 3. Inflammatory changes extend into the pancreaticoduodenal groove; consider serum lipase to assess for pancreatitis, with no pancreatic mass or ductal dilatation. 4. Fatty liver. Electronically signed by: Francis Quam MD 10/18/2024 11:55 PM EST RP Workstation: [id number]    Microbiology: Results for orders placed or performed during the hospital encounter of 08/29/20  Respiratory Panel by RT PCR (Flu A&B,  Covid) - Nasopharyngeal Swab     Status: None   Collection Time: 08/29/20  9:18 PM   Specimen: Nasopharyngeal Swab  Result Value Ref Range Status   SARS Coronavirus 2 by RT PCR NEGATIVE NEGATIVE Final    Comment: (NOTE) SARS-CoV-2 target nucleic acids are NOT DETECTED.  The SARS-CoV-2 RNA is generally detectable in upper respiratoy specimens during the acute phase of infection. The lowest concentration of SARS-CoV-2 viral copies this assay can detect is 131 copies/mL. A negative result does not preclude SARS-Cov-2 infection and should not be used as the sole basis for treatment or other patient management decisions. A negative result may occur with  improper specimen collection/handling, submission of specimen other than nasopharyngeal swab, presence of viral mutation(s) within the areas targeted by this assay, and inadequate number of viral copies (<131 copies/mL). A negative result must be combined with clinical observations, patient history, and epidemiological information. The expected result is Negative.  Fact Sheet for Patients:  https://www.moore.com/  Fact Sheet for Healthcare Providers:  https://www.young.biz/  This test is no t yet approved or cleared by the [place]  FDA and  has been authorized for detection and/or diagnosis of SARS-CoV-2 by FDA under an Emergency Use Authorization (EUA). This EUA will remain  in effect (meaning this test can be used) for the duration of the COVID-19 declaration under Section 564(b)(1) of the Act, 21 U.S.C. section 360bbb-3(b)(1), unless the authorization is terminated or revoked sooner.     Influenza A by PCR NEGATIVE NEGATIVE Final   Influenza B  by PCR NEGATIVE NEGATIVE Final    Comment: (NOTE) The Xpert Xpress SARS-CoV-2/FLU/RSV assay is intended as an aid in  the diagnosis of influenza from Nasopharyngeal swab specimens and  should not be used as a sole basis for treatment. Nasal  washings and  aspirates are unacceptable for Xpert Xpress SARS-CoV-2/FLU/RSV  testing.  Fact Sheet for Patients: https://www.moore.com/  Fact Sheet for Healthcare Providers: https://www.young.biz/  This test is not yet approved or cleared by the [place]  FDA and  has been authorized for detection and/or diagnosis of SARS-CoV-2 by  FDA under an Emergency Use Authorization (EUA). This EUA will remain  in effect (meaning this test can be used) for the duration of the  Covid-19 declaration under Section 564(b)(1) of the Act, 21  U.S.C. section 360bbb-3(b)(1), unless the authorization is  terminated or revoked. Performed at [facility], [address] [place], [place] [address]     Labs: CBC: Recent Labs  Lab 10/18/24 2103 10/20/24 0605  WBC 6.2 5.4  NEUTROABS 3.2  --   HGB 13.4 12.9  HCT 39.7 39.0  MCV 103.1* 104.0*  PLT 173 144*   Basic Metabolic Panel: Recent Labs  Lab 10/18/24 2103 10/20/24 0605  NA 134* 136  K 4.4 4.0  CL 99 101  CO2 25 26  GLUCOSE 94 103*  BUN 9 <5*  CREATININE 0.59 0.51  CALCIUM  9.1 8.7*  PHOS  --  3.0   Liver Function Tests: Recent Labs  Lab 10/18/24 2103 10/20/24 0605  AST 46* 38  ALT 29 28  ALKPHOS 62 60  BILITOT 0.3 0.3  PROT 7.0 6.8  ALBUMIN 4.2 4.2   CBG: No results for input(s): GLUCAP in the last 168 hours.  Discharge time spent: {LESS THAN/GREATER UYJW:73611} 30 minutes.  Signed: Concepcion Riser, MD [facility] 10/21/2024 "

## 2024-10-21 NOTE — Discharge Instructions (Signed)
 SABRA

## 2024-10-21 NOTE — Plan of Care (Signed)

## 2024-10-23 ENCOUNTER — Telehealth (INDEPENDENT_AMBULATORY_CARE_PROVIDER_SITE_OTHER): Payer: Self-pay

## 2024-10-23 NOTE — Transitions of Care (Post Inpatient/ED Visit) (Signed)
" ° °  10/23/2024  Name: Melanie Moreno MRN: [id number] DOB: May 30, 1976  Today's TOC FU Call Status: Today's TOC FU Call Status:: Unsuccessful Call (1st Attempt) Unsuccessful Call (1st Attempt) Date: 10/23/24  Attempted to reach the patient regarding the most recent Inpatient/ED visit.  Follow Up Plan: Additional outreach attempts will be made to reach the patient to complete the Transitions of Care (Post Inpatient/ED visit) call.   Signature Ardella Dawn LPN [facility] AWV Team Direct dial:  [phone]  "

## 2024-10-26 NOTE — Transitions of Care (Post Inpatient/ED Visit) (Signed)
" ° °  10/26/2024  Name: Melanie Moreno MRN: [id number] DOB: [birth year]  Today's TOC FU Call Status: Today's TOC FU Call Status:: Unsuccessful Call (2nd Attempt) Unsuccessful Call (1st Attempt) Date: 10/23/24 Unsuccessful Call (2nd Attempt) Date: 10/26/24  Attempted to reach the patient regarding the most recent Inpatient/ED visit.  Follow Up Plan: Additional outreach attempts will be made to reach the patient to complete the Transitions of Care (Post Inpatient/ED visit) call.   Signature  Julian Lemmings, LPN [facility] Nurse Health Advisor Direct Dial [phone]  "

## 2024-10-27 NOTE — Transitions of Care (Post Inpatient/ED Visit) (Signed)
" ° °  10/27/2024  Name: Melanie Moreno MRN: [id number] DOB: 11/14/1975  Today's TOC FU Call Status: Today's TOC FU Call Status:: Unsuccessful Call (3rd Attempt) Unsuccessful Call (1st Attempt) Date: 10/23/24 Unsuccessful Call (2nd Attempt) Date: 10/26/24 Unsuccessful Call (3rd Attempt) Date: 10/27/24  Attempted to reach the patient regarding the most recent Inpatient/ED visit.  Follow Up Plan: No further outreach attempts will be made at this time. We have been unable to contact the patient.  Signature Nikki M,CMA "

## 2024-10-27 NOTE — Telephone Encounter (Signed)
 Noted pt is schedule to see pcp 11/23/24

## 2024-10-27 NOTE — Transitions of Care (Post Inpatient/ED Visit) (Signed)
" ° °  10/27/2024  Name: Melanie Moreno MRN: [id number] DOB: Apr 05, 1976  Today's TOC FU Call Status: Today's TOC FU Call Status:: Unsuccessful Call (3rd Attempt) Unsuccessful Call (1st Attempt) Date: 10/23/24 Unsuccessful Call (2nd Attempt) Date: 10/26/24 Unsuccessful Call (3rd Attempt) Date: 10/27/24  Attempted to reach the patient regarding the most recent Inpatient/ED visit.  Follow Up Plan: No further outreach attempts will be made at this time. We have been unable to contact the patient.  Signature Julian Lemmings, LPN [facility] Nurse Health Advisor Direct Dial [phone]  "

## 2024-11-10 ENCOUNTER — Other Ambulatory Visit (INDEPENDENT_AMBULATORY_CARE_PROVIDER_SITE_OTHER): Payer: Self-pay | Admitting: Primary Care

## 2024-11-10 MED ORDER — METOPROLOL TARTRATE 100 MG PO TABS: 100.0000 mg | ORAL_TABLET | Freq: Two times a day (BID) | ORAL | 0 refills | Status: AC

## 2024-11-10 NOTE — Telephone Encounter (Signed)
 Requested Prescriptions  Pending Prescriptions Disp Refills   metoprolol  tartrate (LOPRESSOR ) 100 MG tablet 60 tablet 0    Sig: Take 1 tablet (100 mg total) by mouth 2 (two) times daily.     Cardiovascular:  Beta Blockers Failed - 11/10/2024  4:51 PM      Failed - Last BP in normal range    BP Readings from Last 1 Encounters:  10/21/24 (!) 154/111         Failed - Valid encounter within last 6 months    Recent Outpatient Visits           7 months ago Essential hypertension   [facility] - A Dept Of [place]. [facility] Garnette LITTIE, RPH-CPP   9 months ago Essential hypertension   [facility] [place] - A Dept Of [place]. [facility] Tonia, Garnette LITTIE, RPH-CPP   10 months ago Essential hypertension   [facility] - A Dept Of . [facility] Tonia, [place] L, RPH-CPP   11 months ago Essential hypertension   [place] [facility] Celestia Rosaline SQUIBB, NP   1 year ago Hypertension, uncontrolled   [place] [facility] Celestia Rosaline SQUIBB, NP              Passed - Last Heart Rate in normal range    Pulse Readings from Last 1 Encounters:  10/21/24 (!) 55          hydrALAZINE  (APRESOLINE ) 10 MG tablet 180 tablet 1    Sig: Take 1 tablet (10 mg total) by mouth in the morning and at bedtime.     Cardiovascular:  Vasodilators Failed - 11/10/2024  4:51 PM      Failed - RBC in normal range and within 360 days    RBC  Date Value Ref Range Status  10/20/2024 3.75 (L) 3.87 - 5.11 MIL/uL Final         Failed - PLT in normal range and within 360 days    Platelets  Date Value Ref Range Status  10/20/2024 144 (L) 150 - 400 K/uL Final  09/06/2023 419 150 - 450 x10E3/uL Final         Failed - ANA Screen, Ifa, Serum in normal range and within 360 days    No results found for: ANA, ANATITER, LABANTI       Failed - Last BP in normal  range    BP Readings from Last 1 Encounters:  10/21/24 (!) 154/111         Passed - HCT in normal range and within 360 days    HCT  Date Value Ref Range Status  10/20/2024 39.0 36.0 - 46.0 % Final   Hematocrit  Date Value Ref Range Status  09/06/2023 33.0 (L) 34.0 - 46.6 % Final         Passed - HGB in normal range and within 360 days    Hemoglobin  Date Value Ref Range Status  10/20/2024 12.9 12.0 - 15.0 g/dL Final  88/84/7975 88.8 11.1 - 15.9 g/dL Final         Passed - WBC in normal range and within 360 days    WBC  Date Value Ref Range Status  10/20/2024 5.4 4.0 - 10.5 K/uL Final         Passed - Valid encounter within last 12 months    Recent Outpatient Visits  7 months ago Essential hypertension   [facility] - [facility] Dept Of [place]. [facility] Fleeta Tonia Garnette LITTIE, RPH-CPP   9 months ago Essential hypertension   [facility] [place] - A Dept Of [place]. [place] [facility] Tonia, Garnette LITTIE, RPH-CPP   10 months ago Essential hypertension   [facility] - A Dept Of [place]. [facility] Fleeta Tonia Garnette LITTIE, RPH-CPP   11 months ago Essential hypertension   [place] [facility] Celestia Rosaline SQUIBB, NP   1 year ago Hypertension, uncontrolled   [place] [facility] Celestia Rosaline SQUIBB, NP

## 2024-11-10 NOTE — Telephone Encounter (Signed)
 Requested medications are due for refill today.  yes  Requested medications are on the active medications list.  yes  Last refill. 12/30/2023 #180 1 rf  Future visit scheduled.   yes  Notes to clinic.  Missing labs    Requested Prescriptions  Pending Prescriptions Disp Refills   hydrALAZINE  (APRESOLINE ) 10 MG tablet 180 tablet 1    Sig: Take 1 tablet (10 mg total) by mouth in the morning and at bedtime.     Cardiovascular:  Vasodilators Failed - 11/10/2024  4:51 PM      Failed - RBC in normal range and within 360 days    RBC  Date Value Ref Range Status  10/20/2024 3.75 (L) 3.87 - 5.11 MIL/uL Final         Failed - PLT in normal range and within 360 days    Platelets  Date Value Ref Range Status  10/20/2024 144 (L) 150 - 400 K/uL Final  09/06/2023 419 150 - 450 x10E3/uL Final         Failed - ANA Screen, Ifa, Serum in normal range and within 360 days    No results found for: ANA, ANATITER, LABANTI       Failed - Last BP in normal range    BP Readings from Last 1 Encounters:  10/21/24 (!) 154/111         Passed - HCT in normal range and within 360 days    HCT  Date Value Ref Range Status  10/20/2024 39.0 36.0 - 46.0 % Final   Hematocrit  Date Value Ref Range Status  09/06/2023 33.0 (L) 34.0 - 46.6 % Final         Passed - HGB in normal range and within 360 days    Hemoglobin  Date Value Ref Range Status  10/20/2024 12.9 12.0 - 15.0 g/dL Final  88/84/7975 88.8 11.1 - 15.9 g/dL Final         Passed - WBC in normal range and within 360 days    WBC  Date Value Ref Range Status  10/20/2024 5.4 4.0 - 10.5 K/uL Final         Passed - Valid encounter within last 12 months    Recent Outpatient Visits           7 months ago Essential hypertension   [facility] [place] - [facility] Dept Of [place]. [facility] Fleeta Tonia Garnette LITTIE, RPH-CPP   9 months ago Essential hypertension   [facility] - A Dept Of [place]. [facility] Fleeta Tonia, Garnette LITTIE, RPH-CPP   10 months ago Essential hypertension   [facility] [place] - A Dept Of [place]. [facility]eta Tonia, Garnette LITTIE, RPH-CPP   11 months ago Essential hypertension   [place] [facility] Celestia Rosaline SQUIBB, NP   1 year ago Hypertension, uncontrolled    [facility] Celestia Rosaline SQUIBB, NP              Signed Prescriptions Disp Refills   metoprolol  tartrate (LOPRESSOR ) 100 MG tablet 60 tablet 0    Sig: Take 1 tablet (100 mg total) by mouth 2 (two) times daily.     Cardiovascular:  Beta Blockers Failed - 11/10/2024  4:51 PM      Failed - Last BP in normal range    BP Readings from Last 1 Encounters:  10/21/24 (!) 154/111  Failed - Valid encounter within last 6 months    Recent Outpatient Visits           7 months ago Essential hypertension   [facility] - A Dept Of [place]. [facility] Fleeta Tonia Garnette LITTIE, RPH-CPP   9 months ago Essential hypertension   [facility] - A Dept Of [place]. [facility]eta Tonia, Garnette LITTIE, RPH-CPP   10 months ago Essential hypertension   [facility] [place] - A Dept Of [place]. [facility] Tonia Garnette LITTIE, RPH-CPP   11 months ago Essential hypertension   [place] [facility] Celestia Rosaline SQUIBB, NP   1 year ago Hypertension, uncontrolled   [place] [facility] Celestia Rosaline SQUIBB, NP              Passed - Last Heart Rate in normal range    Pulse Readings from Last 1 Encounters:  10/21/24 (!) 55

## 2024-11-10 NOTE — Telephone Encounter (Signed)
 Copied from CRM #[id number]. Topic: Clinical - Medication Refill >> Nov 10, 2024 10:55 AM Winona R wrote: Medication:  metoprolol  tartrate (LOPRESSOR ) 100 MG tablet- Note top pharmacy pt must keep her appointment for additional refills but pt no show last appointment 12/15, next appointment sch for 02/02 hydrALAZINE  (APRESOLINE ) 10 MG tablet Has the patient contacted their pharmacy? Yes (Agent: If no, request that the patient contact the pharmacy for the refill. If patient does not wish to contact the pharmacy document the reason why and proceed with request.) (Agent: If yes, when and what did the pharmacy advise?)  This is the patient's preferred pharmacy:  [facility] [address], [place] - [address] [place]  [address] Phone: [phone] Fax: [phone]  Is this the correct pharmacy for this prescription? Yes If no, delete pharmacy and type the correct one.   Has the prescription been filled recently? Yes  Is the patient out of the medication? Yes  Has the patient been seen for an appointment in the last year OR does the patient have an upcoming appointment? Yes  Can we respond through MyChart? Yes  Agent: Please be advised that Rx refills may take up to 3 business days. We ask that you follow-up with your pharmacy.

## 2024-11-11 NOTE — Telephone Encounter (Signed)
 Pt last seen 11/2023. Will forward to provider

## 2024-11-20 ENCOUNTER — Telehealth: Payer: Self-pay | Admitting: Primary Care

## 2024-11-20 NOTE — Telephone Encounter (Signed)
 Called pt to confirm appt. Pt did not answer and I explained that pt has an appt at [facility] - [address] [place] [address]. Please advise

## 2024-11-23 ENCOUNTER — Telehealth: Payer: Self-pay

## 2024-11-23 ENCOUNTER — Other Ambulatory Visit: Payer: Self-pay

## 2024-11-23 ENCOUNTER — Other Ambulatory Visit (INDEPENDENT_AMBULATORY_CARE_PROVIDER_SITE_OTHER): Payer: Self-pay | Admitting: Primary Care

## 2024-11-23 ENCOUNTER — Ambulatory Visit (INDEPENDENT_AMBULATORY_CARE_PROVIDER_SITE_OTHER): Payer: Self-pay | Admitting: Primary Care

## 2024-11-23 MED ORDER — AMLODIPINE BESYLATE 5 MG PO TABS: 5.0000 mg | ORAL_TABLET | Freq: Every day | ORAL | 0 refills | Status: AC

## 2024-11-23 NOTE — Telephone Encounter (Signed)
 Copied from CRM #[id number]. Topic: Clinical - Medication Refill >> Nov 23, 2024  3:58 PM Antony S wrote: Medication:  hydrALAZINE  (APRESOLINE ) 10 MG tablet     Has the patient contacted their pharmacy? Yes (Agent: If no, request that the patient contact the pharmacy for the refill. If patient does not wish to contact the pharmacy document the reason why and proceed with request.) (Agent: If yes, when and what did the pharmacy advise?)  This is the patient's preferred pharmacy:  [facility] [address], [place] - [address] [place] [address] Phone: [phone] Fax: [phone]  Is this the correct pharmacy for this prescription? Yes If no, delete pharmacy and type the correct one.   Has the prescription been filled recently? No  Is the patient out of the medication? Yes  Has the patient been seen for an appointment in the last year OR does the patient have an upcoming appointment? Yes  Can we respond through MyChart? Yes  Agent: Please be advised that Rx refills may take up to 3 business days. We ask that you follow-up with your pharmacy.

## 2024-11-23 NOTE — Telephone Encounter (Signed)
 Contacted pt to cancel and reschedule appt due to the weather call can't be completed as dialed   If pt calls back please reschedule

## 2024-11-25 NOTE — Telephone Encounter (Signed)
 Requested medication (s) are due for refill today: yes  Requested medication (s) are on the active medication list: yes  Last refill:  12/30/23  Future visit scheduled: yes  Notes to clinic:  Routing for review of SIG, does patient take 2x or 3x a day. Due for refill.  Requested Prescriptions  Pending Prescriptions Disp Refills   hydrALAZINE  (APRESOLINE ) 10 MG tablet 180 tablet 1    Sig: Take 1 tablet (10 mg total) by mouth in the morning and at bedtime.     Cardiovascular:  Vasodilators Failed - 11/25/2024 10:27 AM      Failed - RBC in normal range and within 360 days    RBC  Date Value Ref Range Status  10/20/2024 3.75 (L) 3.87 - 5.11 MIL/uL Final         Failed - PLT in normal range and within 360 days    Platelets  Date Value Ref Range Status  10/20/2024 144 (L) 150 - 400 K/uL Final  09/06/2023 419 150 - 450 x10E3/uL Final         Failed - ANA Screen, Ifa, Serum in normal range and within 360 days    No results found for: ANA, ANATITER, LABANTI       Failed - Last BP in normal range    BP Readings from Last 1 Encounters:  10/21/24 (!) 154/111         Passed - HCT in normal range and within 360 days    HCT  Date Value Ref Range Status  10/20/2024 39.0 36.0 - 46.0 % Final   Hematocrit  Date Value Ref Range Status  09/06/2023 33.0 (L) 34.0 - 46.6 % Final         Passed - HGB in normal range and within 360 days    Hemoglobin  Date Value Ref Range Status  10/20/2024 12.9 12.0 - 15.0 g/dL Final  88/84/7975 88.8 11.1 - 15.9 g/dL Final         Passed - WBC in normal range and within 360 days    WBC  Date Value Ref Range Status  10/20/2024 5.4 4.0 - 10.5 K/uL Final         Passed - Valid encounter within last 12 months    Recent Outpatient Visits           8 months ago Essential hypertension   [facility] [place] Fle[facility] Tonia Garnette LITTIE, RPH-CPP   9 months ago Essential hypertension   [place]  [facility] - [facility] Dept Of [place]. [facility] Fleeta Tonia Garnette LITTIE, RPH-CPP   11 months ago Essential hypertension   [facility] - A Dept Of [place]. [facility] Fleeta Tonia Garnette LITTIE, RPH-CPP   11 months ago Essential hypertension   [facility] Celestia Rosaline SQUIBB, NP   1 year ago Hypertension, uncontrolled   [facility] Celestia Rosaline SQUIBB, NP

## 2024-11-27 ENCOUNTER — Other Ambulatory Visit (INDEPENDENT_AMBULATORY_CARE_PROVIDER_SITE_OTHER): Payer: Self-pay | Admitting: Primary Care

## 2024-11-27 NOTE — Telephone Encounter (Signed)
 Will forward to provider

## 2024-11-27 NOTE — Telephone Encounter (Unsigned)
 Copied from CRM [id number]. Topic: Clinical - Prescription Issue >> Nov 27, 2024 12:38 PM Montie POUR wrote: Reason for CRM:  Ms. Melanie Moreno is calling because her pharmacy does not have order to refill hydrALAZINE  (APRESOLINE ) 10 MG tablet Chart shows: 11/23/24 1604 Reorder Gretta Lauraine HERO, RN To [phone] AND  Patient taking differently: Take 10 mg by mouth 3 (three) times daily.  Sent to pharmacy as: hydrALAZINE  (APRESOLINE ) 10 MG tablet  E-Prescribing Status: Receipt confirmed by pharmacy (12/30/2023  3:44 PM EDT)   She is out of medication for 1 week; please check on order to refill and send as soon as possible.

## 2024-12-07 ENCOUNTER — Ambulatory Visit: Payer: Self-pay | Admitting: Critical Care Medicine
